# Patient Record
Sex: Female | Born: 1965 | Race: Black or African American | Hispanic: No | Marital: Single | Smoking: Current every day smoker
Health system: Southern US, Community
[De-identification: ages and names within clinical notes are randomized; demographics above are authoritative.]

## PROBLEM LIST (undated history)

## (undated) DIAGNOSIS — E7800 Pure hypercholesterolemia, unspecified: Secondary | ICD-10-CM

## (undated) DIAGNOSIS — I513 Intracardiac thrombosis, not elsewhere classified: Secondary | ICD-10-CM

## (undated) DIAGNOSIS — I5022 Chronic systolic (congestive) heart failure: Secondary | ICD-10-CM

## (undated) DIAGNOSIS — I428 Other cardiomyopathies: Secondary | ICD-10-CM

## (undated) DIAGNOSIS — I10 Essential (primary) hypertension: Secondary | ICD-10-CM

## (undated) HISTORY — DX: Other cardiomyopathies: I42.8

## (undated) HISTORY — PX: TUBAL LIGATION: SHX77

## (undated) HISTORY — DX: Intracardiac thrombosis, not elsewhere classified: I51.3

## (undated) HISTORY — DX: Chronic systolic (congestive) heart failure: I50.22

## (undated) HISTORY — PX: KNEE SURGERY: SHX244

## (undated) HISTORY — PX: ENDOMETRIAL ABLATION: SHX621

---

## 2000-05-31 ENCOUNTER — Emergency Department (HOSPITAL_COMMUNITY): Admission: EM | Admit: 2000-05-31 | Discharge: 2000-05-31 | Payer: Self-pay | Admitting: Emergency Medicine

## 2000-06-08 ENCOUNTER — Emergency Department (HOSPITAL_COMMUNITY): Admission: EM | Admit: 2000-06-08 | Discharge: 2000-06-08 | Payer: Self-pay | Admitting: Emergency Medicine

## 2000-06-08 ENCOUNTER — Encounter: Payer: Self-pay | Admitting: Emergency Medicine

## 2001-04-03 ENCOUNTER — Emergency Department (HOSPITAL_COMMUNITY): Admission: EM | Admit: 2001-04-03 | Discharge: 2001-04-03 | Payer: Self-pay

## 2003-03-23 ENCOUNTER — Emergency Department (HOSPITAL_COMMUNITY): Admission: EM | Admit: 2003-03-23 | Discharge: 2003-03-23 | Payer: Self-pay | Admitting: Emergency Medicine

## 2003-09-07 ENCOUNTER — Other Ambulatory Visit: Admission: RE | Admit: 2003-09-07 | Discharge: 2003-09-07 | Payer: Self-pay | Admitting: Family Medicine

## 2003-09-07 ENCOUNTER — Encounter: Admission: RE | Admit: 2003-09-07 | Discharge: 2003-09-07 | Payer: Self-pay | Admitting: Family Medicine

## 2003-12-14 ENCOUNTER — Emergency Department (HOSPITAL_COMMUNITY): Admission: EM | Admit: 2003-12-14 | Discharge: 2003-12-14 | Payer: Self-pay | Admitting: Emergency Medicine

## 2004-01-09 ENCOUNTER — Encounter: Admission: RE | Admit: 2004-01-09 | Discharge: 2004-01-09 | Payer: Self-pay | Admitting: Family Medicine

## 2004-01-09 ENCOUNTER — Encounter: Admission: RE | Admit: 2004-01-09 | Discharge: 2004-01-09 | Payer: Self-pay | Admitting: Sports Medicine

## 2004-01-25 ENCOUNTER — Encounter: Admission: RE | Admit: 2004-01-25 | Discharge: 2004-01-25 | Payer: Self-pay | Admitting: Family Medicine

## 2004-01-30 ENCOUNTER — Encounter: Admission: RE | Admit: 2004-01-30 | Discharge: 2004-01-30 | Payer: Self-pay | Admitting: Family Medicine

## 2004-05-29 ENCOUNTER — Encounter: Admission: RE | Admit: 2004-05-29 | Discharge: 2004-05-29 | Payer: Self-pay | Admitting: Family Medicine

## 2004-06-21 ENCOUNTER — Ambulatory Visit: Payer: Self-pay | Admitting: Family Medicine

## 2004-08-13 ENCOUNTER — Ambulatory Visit: Payer: Self-pay | Admitting: Family Medicine

## 2004-08-28 ENCOUNTER — Ambulatory Visit: Payer: Self-pay | Admitting: Family Medicine

## 2004-11-09 ENCOUNTER — Emergency Department (HOSPITAL_COMMUNITY): Admission: EM | Admit: 2004-11-09 | Discharge: 2004-11-10 | Payer: Self-pay | Admitting: Emergency Medicine
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
## 2004-12-21 ENCOUNTER — Ambulatory Visit: Payer: Self-pay | Admitting: Family Medicine

## 2005-06-18 ENCOUNTER — Ambulatory Visit: Payer: Self-pay | Admitting: Family Medicine

## 2005-07-11 ENCOUNTER — Ambulatory Visit: Payer: Self-pay | Admitting: Family Medicine

## 2005-07-11 ENCOUNTER — Other Ambulatory Visit: Admission: RE | Admit: 2005-07-11 | Discharge: 2005-07-11 | Payer: Self-pay | Admitting: Family Medicine

## 2005-07-15 ENCOUNTER — Ambulatory Visit (HOSPITAL_COMMUNITY): Admission: RE | Admit: 2005-07-15 | Discharge: 2005-07-15 | Payer: Self-pay | Admitting: Family Medicine

## 2005-07-15 IMAGING — US US PELVIS COMPLETE MODIFY
1 series · 13 of 25 positions shown · non-contrast
Comparison: none

CLINICAL DATA: Menorrhagia.  Possible pelvic mass.  Pelvic pain.  LMP [DATE].
TRANSABDOMINAL AND TRANSVAGINAL PELVIC ULTRASOUND:
Multiple images of the uterus and adnexa were obtained using a transabdominal and endovaginal approaches. 
The uterus has a sagittal length of 7.8 cm, AP width of 5.1 cm and a transverse width of 5.6 cm.  The uterine myometrium is notable for the presence of two areas of focally altered echotexture compatible with focal fibroids.  hese are located in the left lateral lower uterine segment with a partial subserosal component measuring 3.8 x 2.6 x 3.0 cm and in the right fundal region posteriorly also demonstrating a partial subserosal component measuring 1.7 x 1.3 x 1.6 cm.  
The endometrial lining demonstrates an area of focal thickening in the right lateral fundal region just slightly less echoic than the remainder of the endometrial lining measuring 1.1 x 1.1 x 0.8 cm.  With color Doppler evaluation this demonstrates some vascular flow and raises the possibility of a submucosal fibroid or possibly a polyp causing this appearance.  The endometrial lining is otherwise thin and echogenic with an AP width of 3.8 mm.  Follow-up evaluation with sonohysterography would be useful for more complete assessment of this suspected focal endometrial abnormality to be undertaken in the immediate post-menstrual phase of the cycle.  
Both ovaries are seen with the left ovary measuring 3.2 x 2.1 x 2.6 cm.  This contains a small complex area measuring 2.5 x 1.8 x 1.9 cm and this is felt likely to represent a hemorrhagic corpus luteum cyst given the patient?s current phase of the cycle.  The right ovary measures 1.9 x 1.0 x 1.8 cm and has a normal appearance.  No significant pelvic fluid is seen and no separate adnexal masses are noted.

[Series 1: us pelvis complete modify · 0.43mm/px · 13 of 76 slices shown]
[im 1/76]
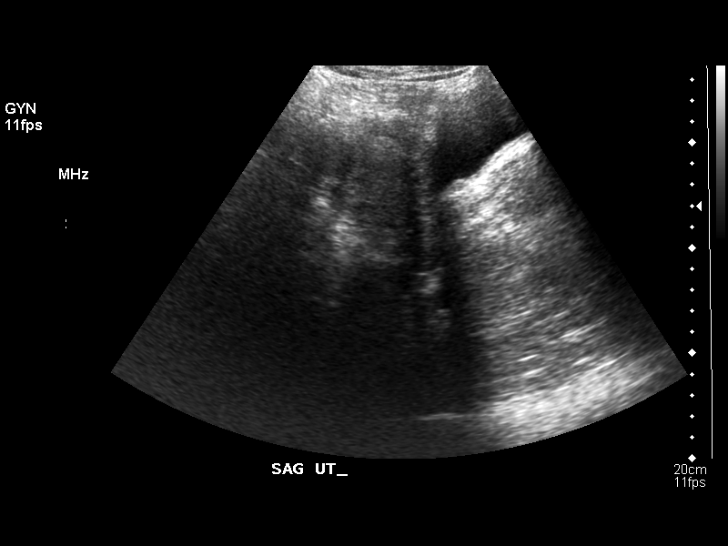
[im 7/76]
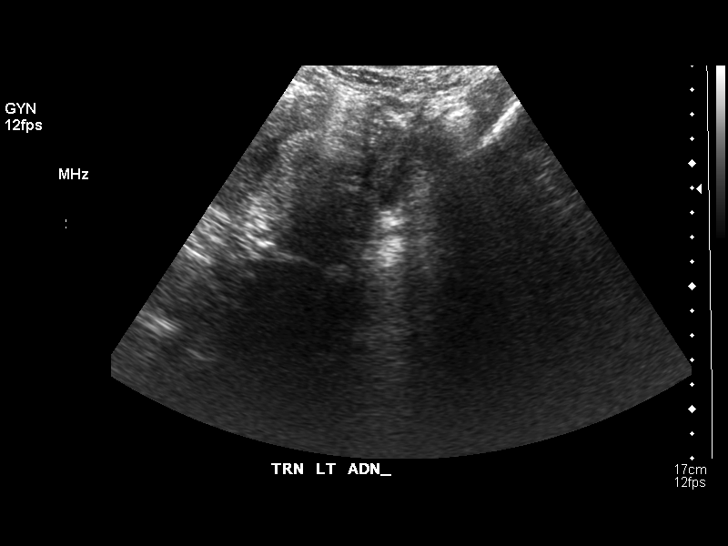
[im 13/76]
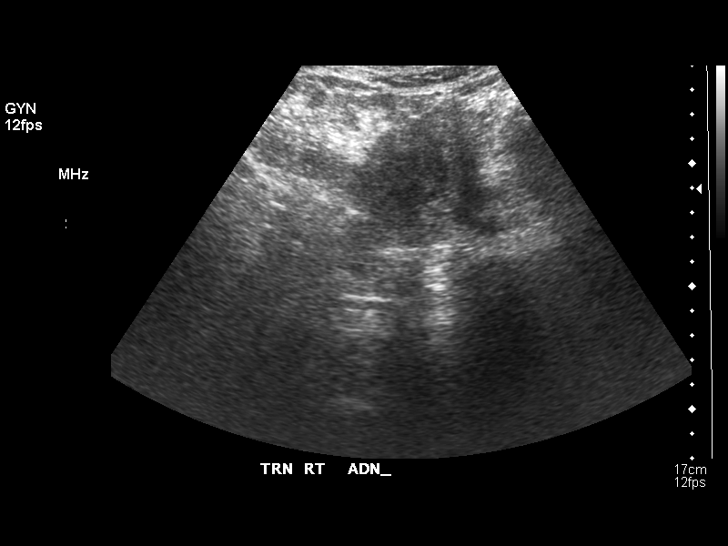
[im 19/76]
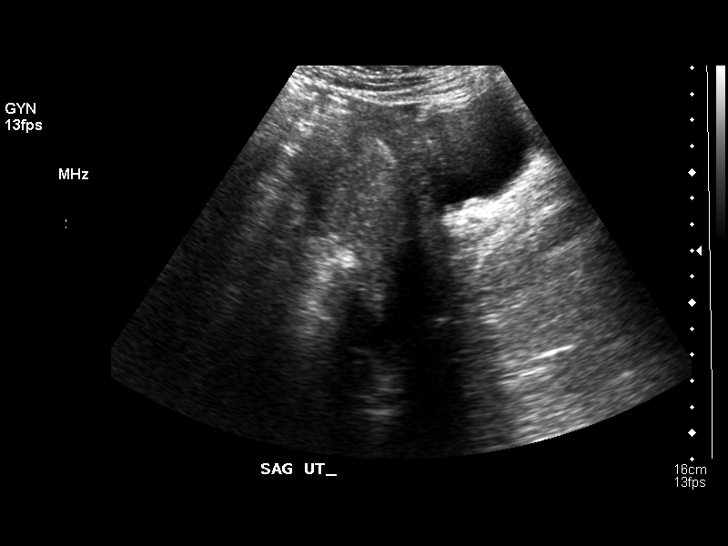
[im 26/76]
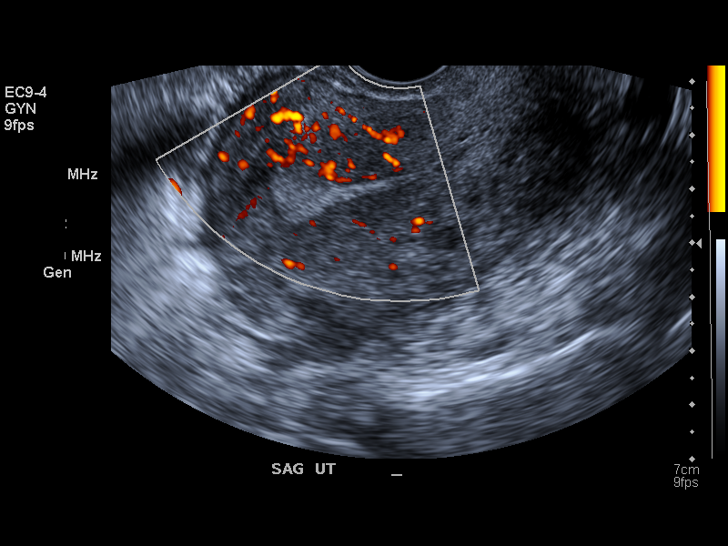
[im 32/76]
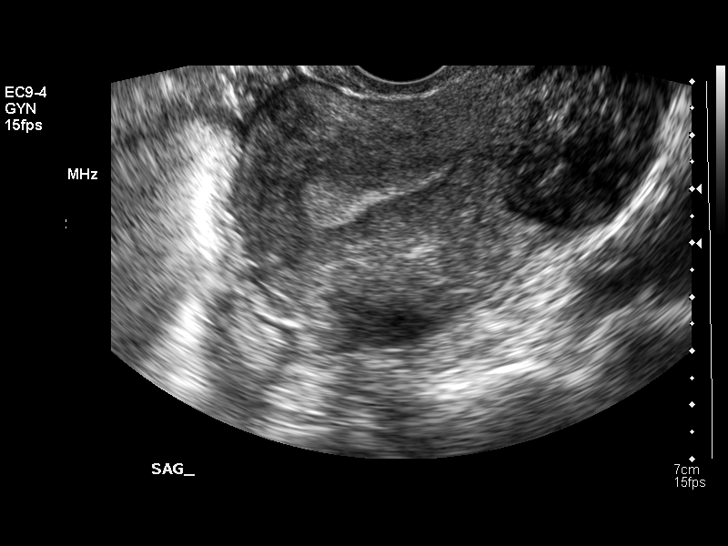
[im 38/76]
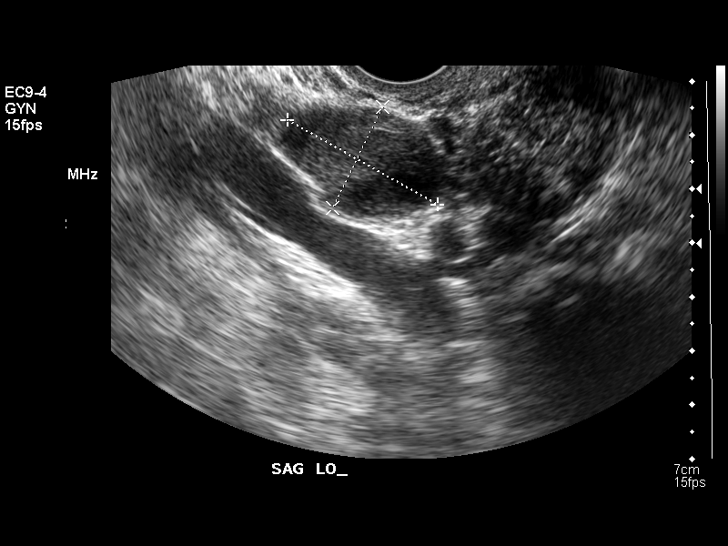
[im 44/76]
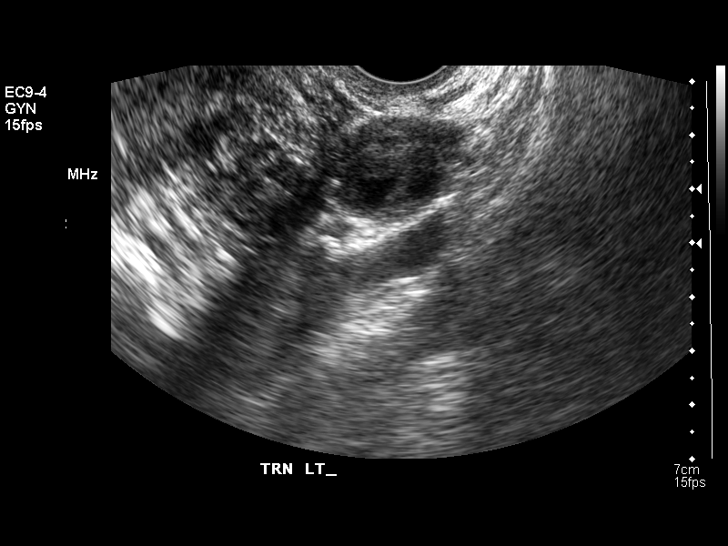
[im 51/76]
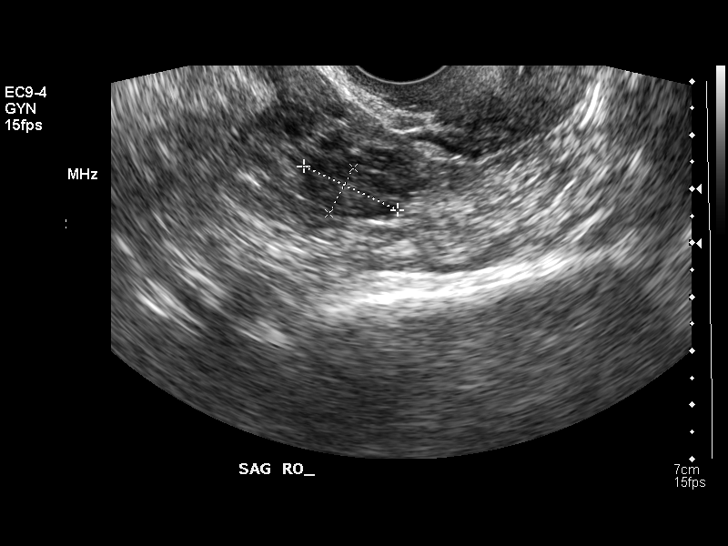
[im 57/76]
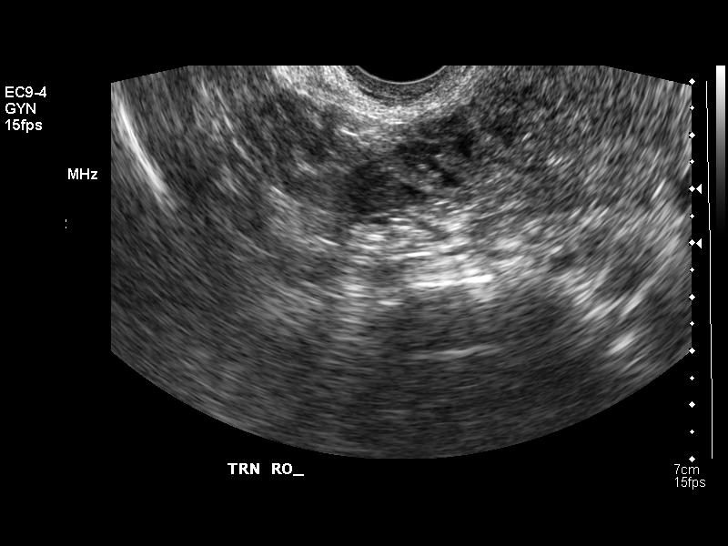
[im 63/76]
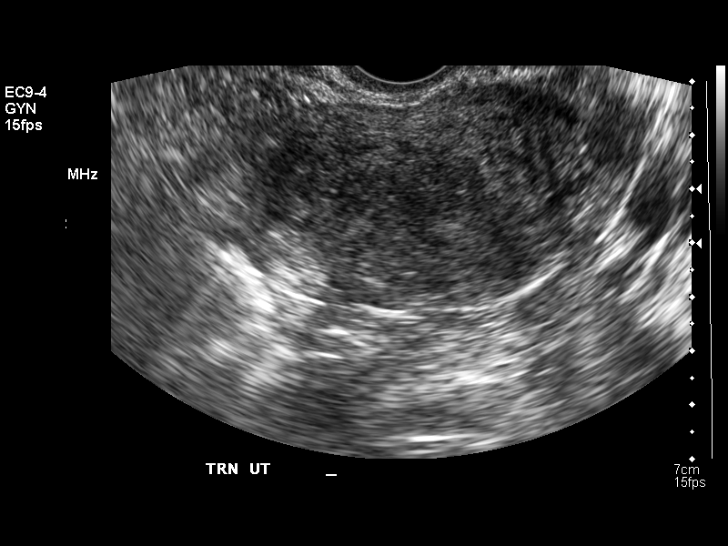
[im 69/76]
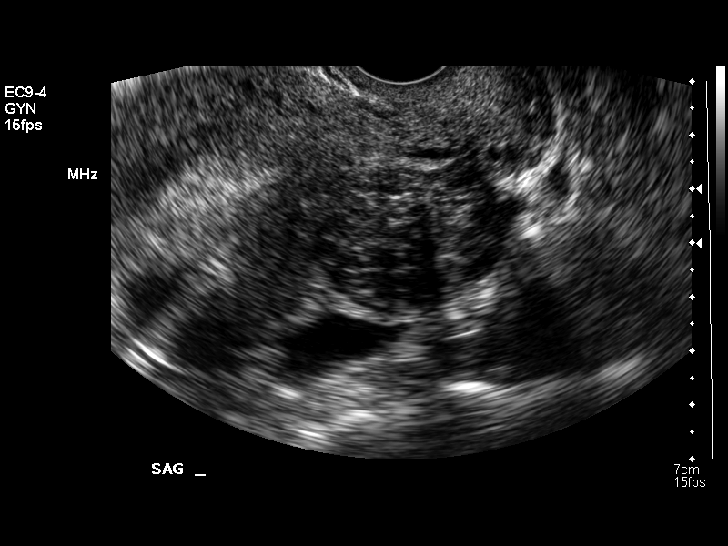
[im 76/76]
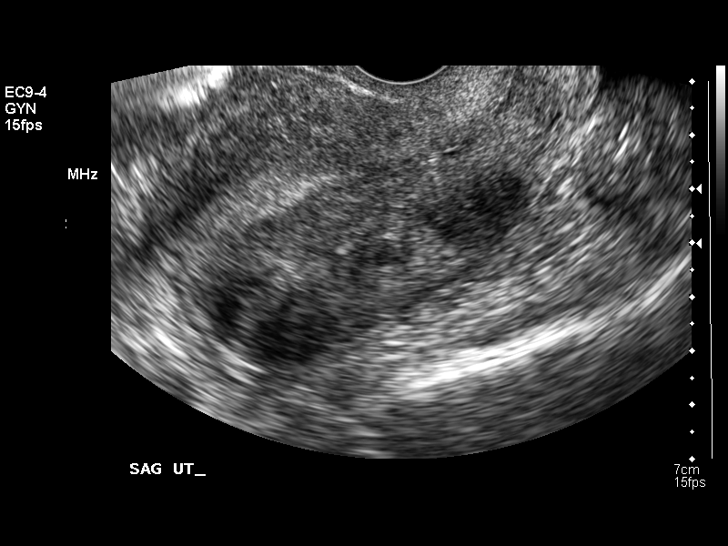

[13 of 25 positions shown; findings below may reference images not displayed]

IMPRESSION: 1.  Fibroid uterus with fibroid sizes and locations as noted above.  
2.  Focal endometrial thickening in the right fundal region suspicious for a small submucosal fibroid and/or polyp.  Follow-up evaluation with sonohysterography in the post-secretory phase of the cycle would be recommended for complete assessment.  
3.  Probable small hemorrhagic corpus luteum cyst in the left ovary.  This can be reassessed at the time of sonohysterography as we would expect to see interval resolution.  Normal right ovary.

## 2005-08-12 ENCOUNTER — Ambulatory Visit: Payer: Self-pay | Admitting: Family Medicine

## 2005-10-08 ENCOUNTER — Ambulatory Visit: Payer: Self-pay | Admitting: Obstetrics & Gynecology

## 2005-10-08 ENCOUNTER — Encounter (INDEPENDENT_AMBULATORY_CARE_PROVIDER_SITE_OTHER): Payer: Self-pay | Admitting: Specialist

## 2005-10-08 ENCOUNTER — Other Ambulatory Visit: Admission: RE | Admit: 2005-10-08 | Discharge: 2005-10-08 | Payer: Self-pay | Admitting: Obstetrics & Gynecology

## 2005-10-22 ENCOUNTER — Ambulatory Visit: Payer: Self-pay | Admitting: Obstetrics & Gynecology

## 2005-11-04 ENCOUNTER — Emergency Department (HOSPITAL_COMMUNITY): Admission: EM | Admit: 2005-11-04 | Discharge: 2005-11-04 | Payer: Self-pay | Admitting: Emergency Medicine

## 2005-12-05 ENCOUNTER — Ambulatory Visit (HOSPITAL_COMMUNITY): Admission: RE | Admit: 2005-12-05 | Discharge: 2005-12-05 | Payer: Self-pay | Admitting: Obstetrics

## 2005-12-05 ENCOUNTER — Encounter (INDEPENDENT_AMBULATORY_CARE_PROVIDER_SITE_OTHER): Payer: Self-pay | Admitting: Specialist

## 2006-01-06 ENCOUNTER — Ambulatory Visit: Payer: Self-pay | Admitting: Family Medicine

## 2006-06-04 ENCOUNTER — Ambulatory Visit: Payer: Self-pay | Admitting: Sports Medicine

## 2006-06-21 ENCOUNTER — Encounter (INDEPENDENT_AMBULATORY_CARE_PROVIDER_SITE_OTHER): Payer: Self-pay | Admitting: *Deleted

## 2006-07-18 ENCOUNTER — Other Ambulatory Visit: Admission: RE | Admit: 2006-07-18 | Discharge: 2006-07-18 | Payer: Self-pay | Admitting: Family Medicine

## 2006-07-18 ENCOUNTER — Ambulatory Visit: Payer: Self-pay | Admitting: Family Medicine

## 2006-08-21 ENCOUNTER — Ambulatory Visit: Payer: Self-pay | Admitting: Sports Medicine

## 2006-11-12 ENCOUNTER — Ambulatory Visit: Payer: Self-pay | Admitting: Family Medicine

## 2006-11-18 ENCOUNTER — Ambulatory Visit: Payer: Self-pay | Admitting: Family Medicine

## 2006-12-18 DIAGNOSIS — I10 Essential (primary) hypertension: Secondary | ICD-10-CM | POA: Insufficient documentation

## 2006-12-18 DIAGNOSIS — E119 Type 2 diabetes mellitus without complications: Secondary | ICD-10-CM

## 2006-12-18 DIAGNOSIS — F329 Major depressive disorder, single episode, unspecified: Secondary | ICD-10-CM | POA: Insufficient documentation

## 2006-12-19 ENCOUNTER — Encounter (INDEPENDENT_AMBULATORY_CARE_PROVIDER_SITE_OTHER): Payer: Self-pay | Admitting: *Deleted

## 2007-05-06 ENCOUNTER — Telehealth: Payer: Self-pay | Admitting: Family Medicine

## 2007-07-16 ENCOUNTER — Encounter: Payer: Self-pay | Admitting: Family Medicine

## 2007-07-28 ENCOUNTER — Encounter: Payer: Self-pay | Admitting: Family Medicine

## 2007-07-31 ENCOUNTER — Encounter (INDEPENDENT_AMBULATORY_CARE_PROVIDER_SITE_OTHER): Payer: Self-pay | Admitting: *Deleted

## 2007-07-31 ENCOUNTER — Telehealth (INDEPENDENT_AMBULATORY_CARE_PROVIDER_SITE_OTHER): Payer: Self-pay | Admitting: *Deleted

## 2007-07-31 ENCOUNTER — Ambulatory Visit: Payer: Self-pay | Admitting: Family Medicine

## 2007-08-06 ENCOUNTER — Ambulatory Visit: Payer: Self-pay | Admitting: Family Medicine

## 2007-08-07 ENCOUNTER — Telehealth: Payer: Self-pay | Admitting: Family Medicine

## 2007-09-04 ENCOUNTER — Emergency Department (HOSPITAL_COMMUNITY): Admission: EM | Admit: 2007-09-04 | Discharge: 2007-09-04 | Payer: Self-pay | Admitting: Emergency Medicine
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
## 2007-09-12 ENCOUNTER — Encounter: Admission: RE | Admit: 2007-09-12 | Discharge: 2007-09-12 | Payer: Self-pay | Admitting: Orthopaedic Surgery
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
## 2007-09-22 ENCOUNTER — Telehealth: Payer: Self-pay | Admitting: Family Medicine

## 2007-09-25 ENCOUNTER — Inpatient Hospital Stay (HOSPITAL_COMMUNITY): Admission: RE | Admit: 2007-09-25 | Discharge: 2007-09-28 | Payer: Self-pay | Admitting: Orthopedic Surgery
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
## 2007-09-29 ENCOUNTER — Emergency Department (HOSPITAL_COMMUNITY): Admission: EM | Admit: 2007-09-29 | Discharge: 2007-09-30 | Payer: Self-pay | Admitting: Emergency Medicine

## 2007-09-30 IMAGING — CT CT ANGIO CHEST
2 of 7 series · 15 of 36 positions shown · IV contrast (APPLIED)
Comparison: None available.

CLINICAL DATA: Chest pain and dyspnea. 
 CT ANGIOGRAPHY OF CHEST:
TECHNIQUE: Multidetector CT imaging of the chest was performed during bolus injection of intravenous contrast.  Multiplanar CT angiographic image reconstructions were generated to evaluate the vascular anatomy.
 Contrast:  80 cc Omnipaque 350.

[Series 4: pulm embolism 2.0 st · axial · 0.58mm/px · z∈[-270,-72]mm · 14 of 115 slices shown]
[im 8/115  lung]
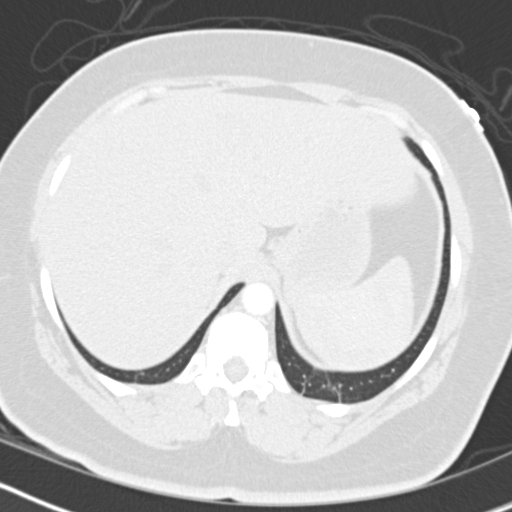
[im 16/115  mediastinal]
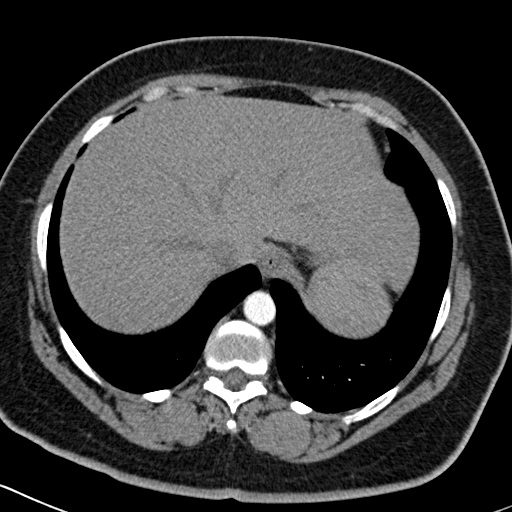
[im 23/115  lung]
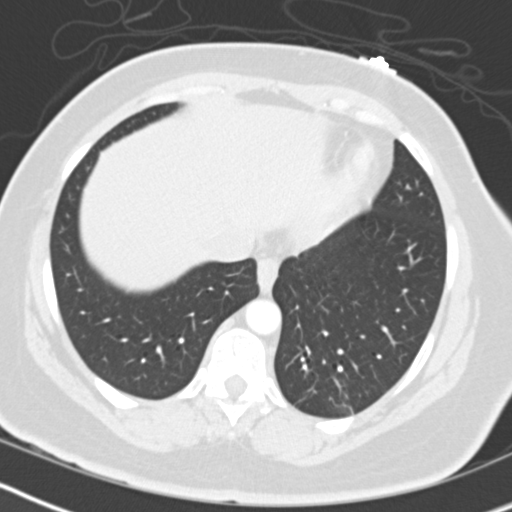
[im 31/115  mediastinal]
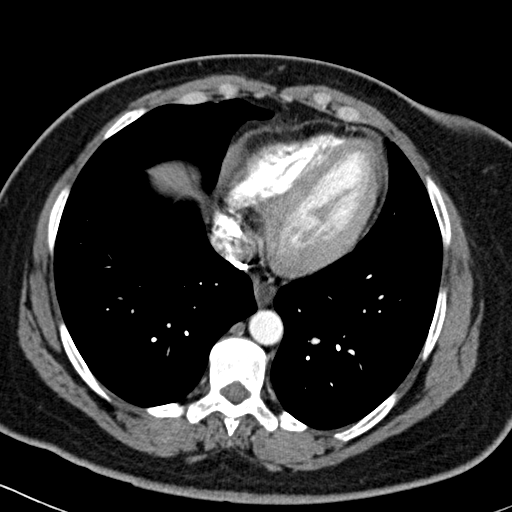
[im 39/115  lung]
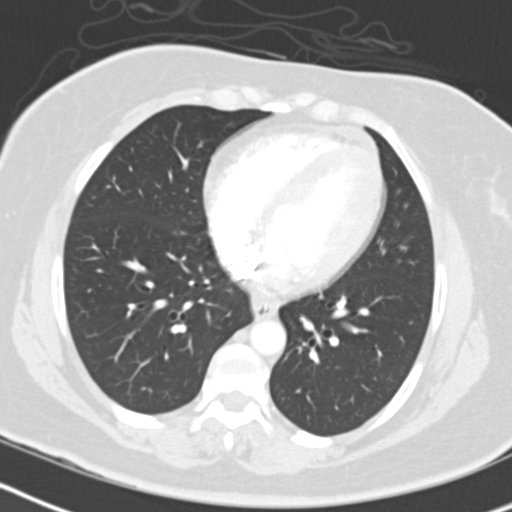
[im 46/115  mediastinal]
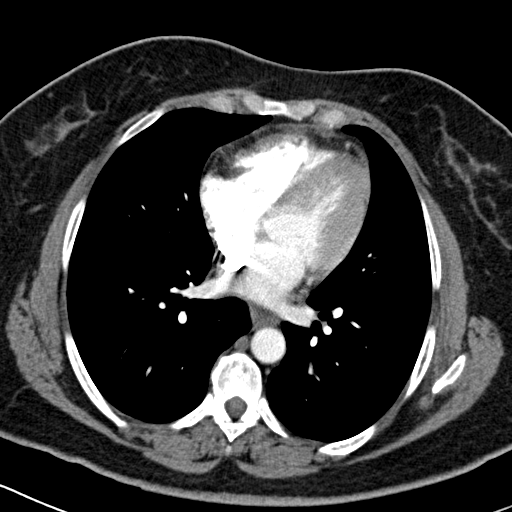
[im 54/115  lung]
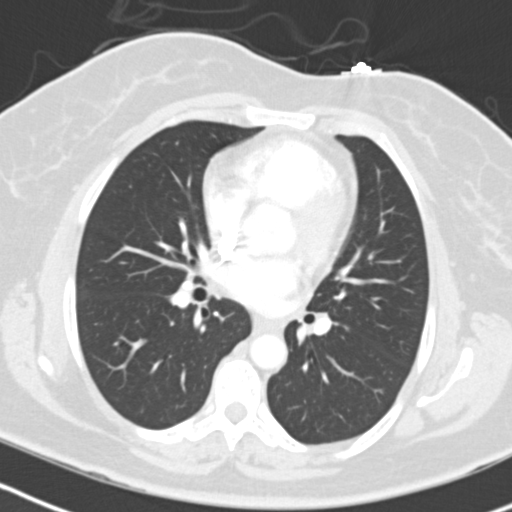
[im 61/115  mediastinal]
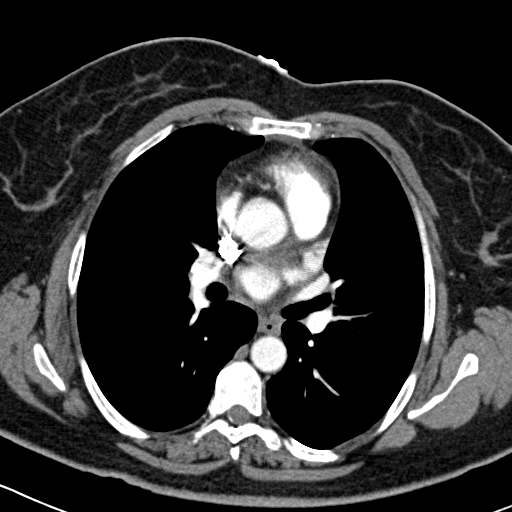
[im 69/115  lung]
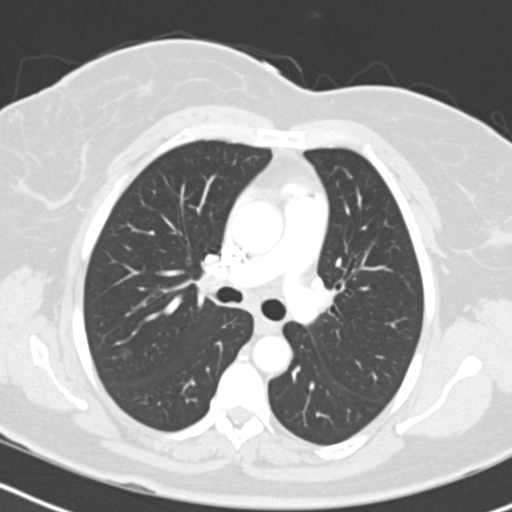
[im 77/115  mediastinal]
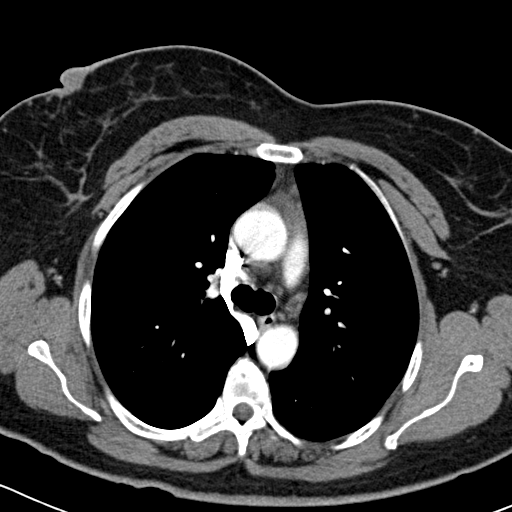
[im 84/115  lung]
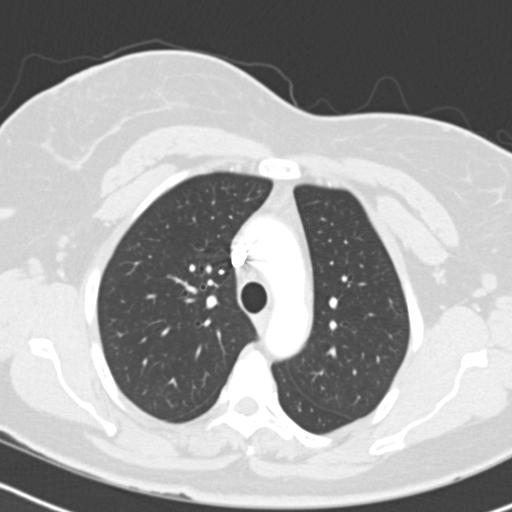
[im 92/115  mediastinal]
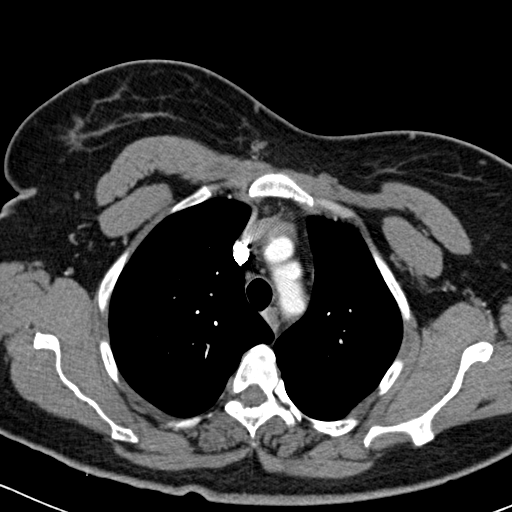
[im 99/115  lung]
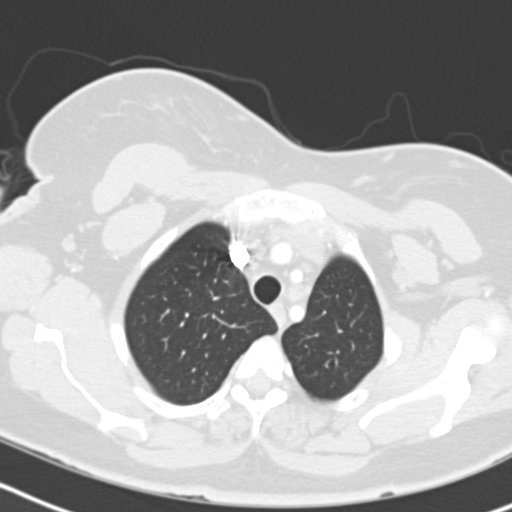
[im 107/115  mediastinal]
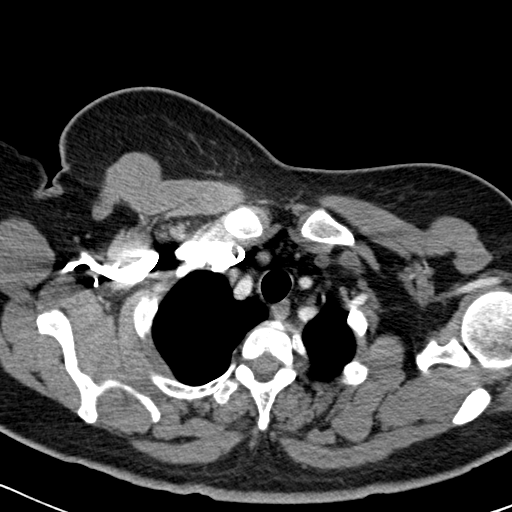

[Series 602: cor · coronal · 0.58mm/px · 1 of 117 slices shown]
[im 59/117  mediastinal]
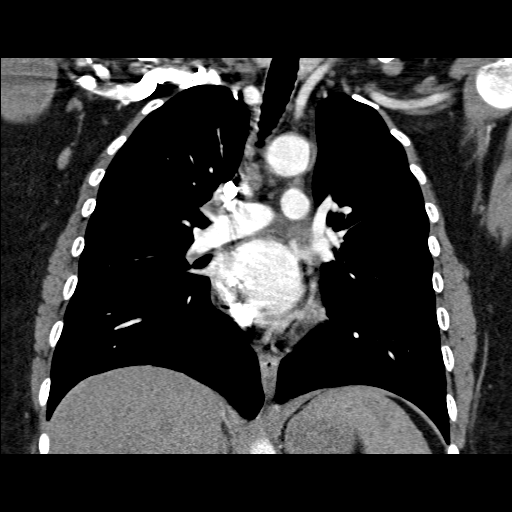

[15 of 36 positions shown; findings below may reference images not displayed]

FINDINGS: This study is technically satisfactory.  There are no filling defects identified in the pulmonary arterial system to suggest pulmonary emboli.  There is no evidence of thoracic aorta aneurysm or dissection.  Tiny pericardial effusion is noted.  No pleural effusions are identified.  Upper limits of normal hilar and mediastinal lymph node with a node noted with a 1 cm node in the upper left mediastinum, between the left subclavian and common carotid arteries (image 13).  The lungs are clear, except for minimal left basilar atelectasis.  There is a suggestion of a 1.5 x 2 cm lesion within the central thyroid.  Elective ultrasound is recommended for further evaluation.
IMPRESSION: 1. No evidence of pulmonary emboli or thoracic aortic dissection/aneurysm.
 2. Question ? 1.5 x 2 cm thyroid lesion ? recommend elective ultrasound evaluation.
 3. Upper limits of normal hilar and mediastinal lymph nodes.  Probably reactive. Consider followup as indicated.

## 2007-09-30 IMAGING — CR DG ABDOMEN ACUTE W/ 1V CHEST
3 series · 3 of 3 positions shown · non-contrast
Comparison: Chest of [DATE].

CLINICAL DATA: Pain.  
 ACUTE ABDOMINAL SERIES WITH CHEST ? 3 VIEW:

[w chest pa]
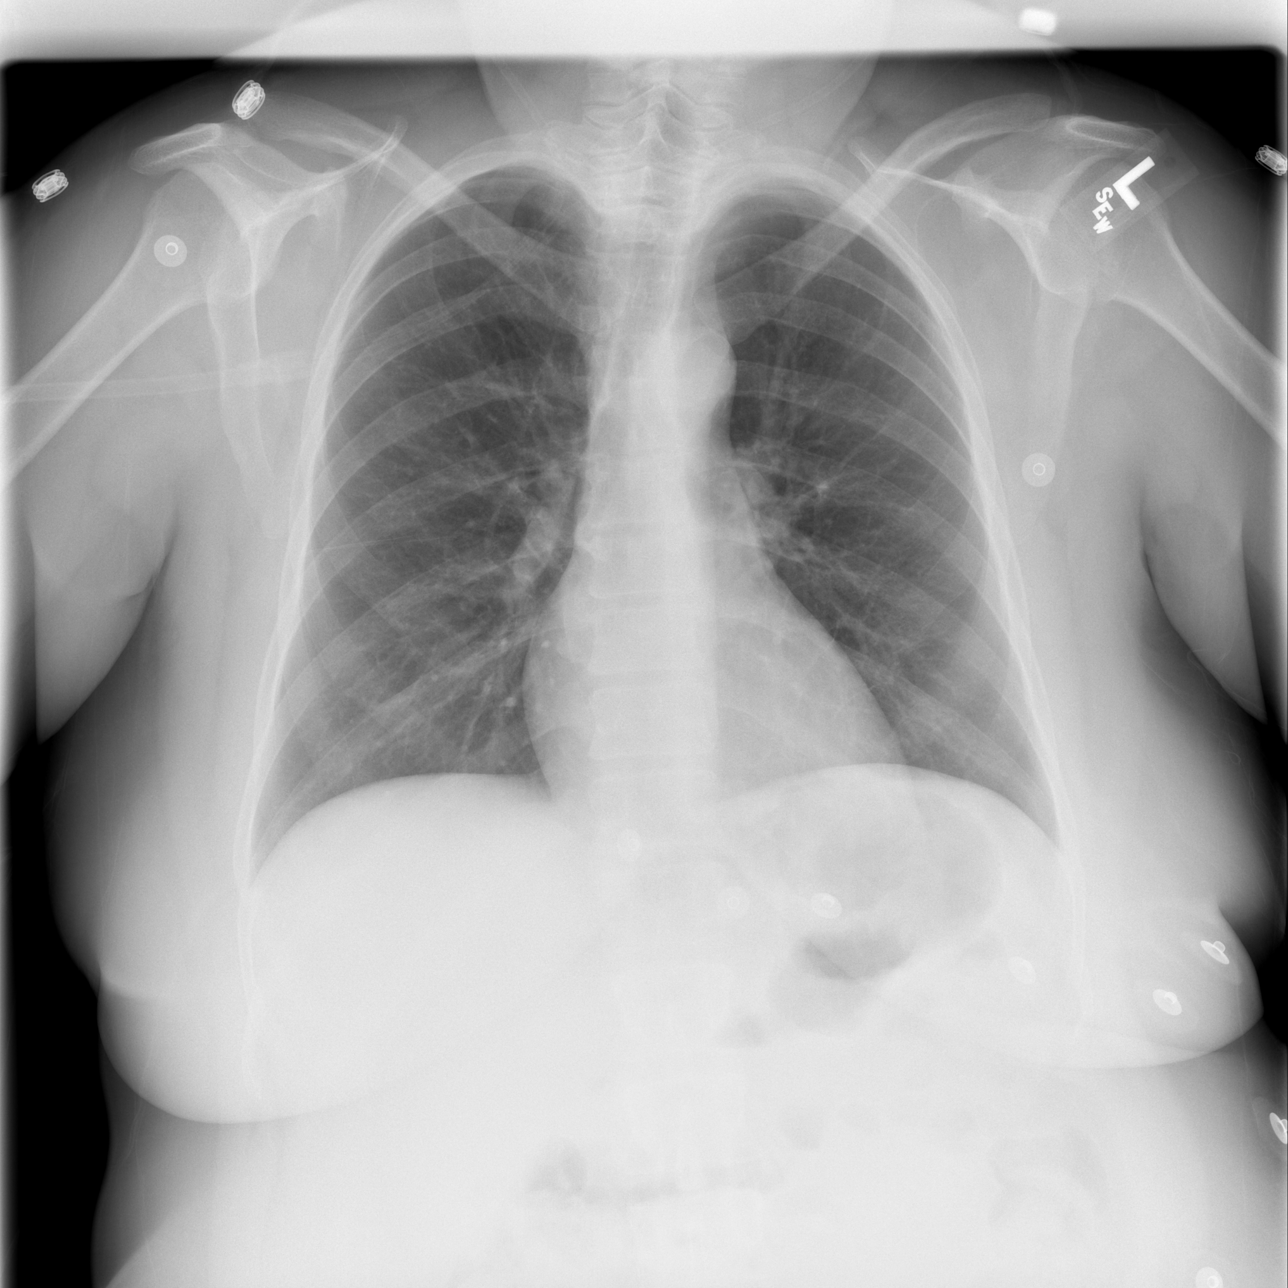

[w abdomen upright]
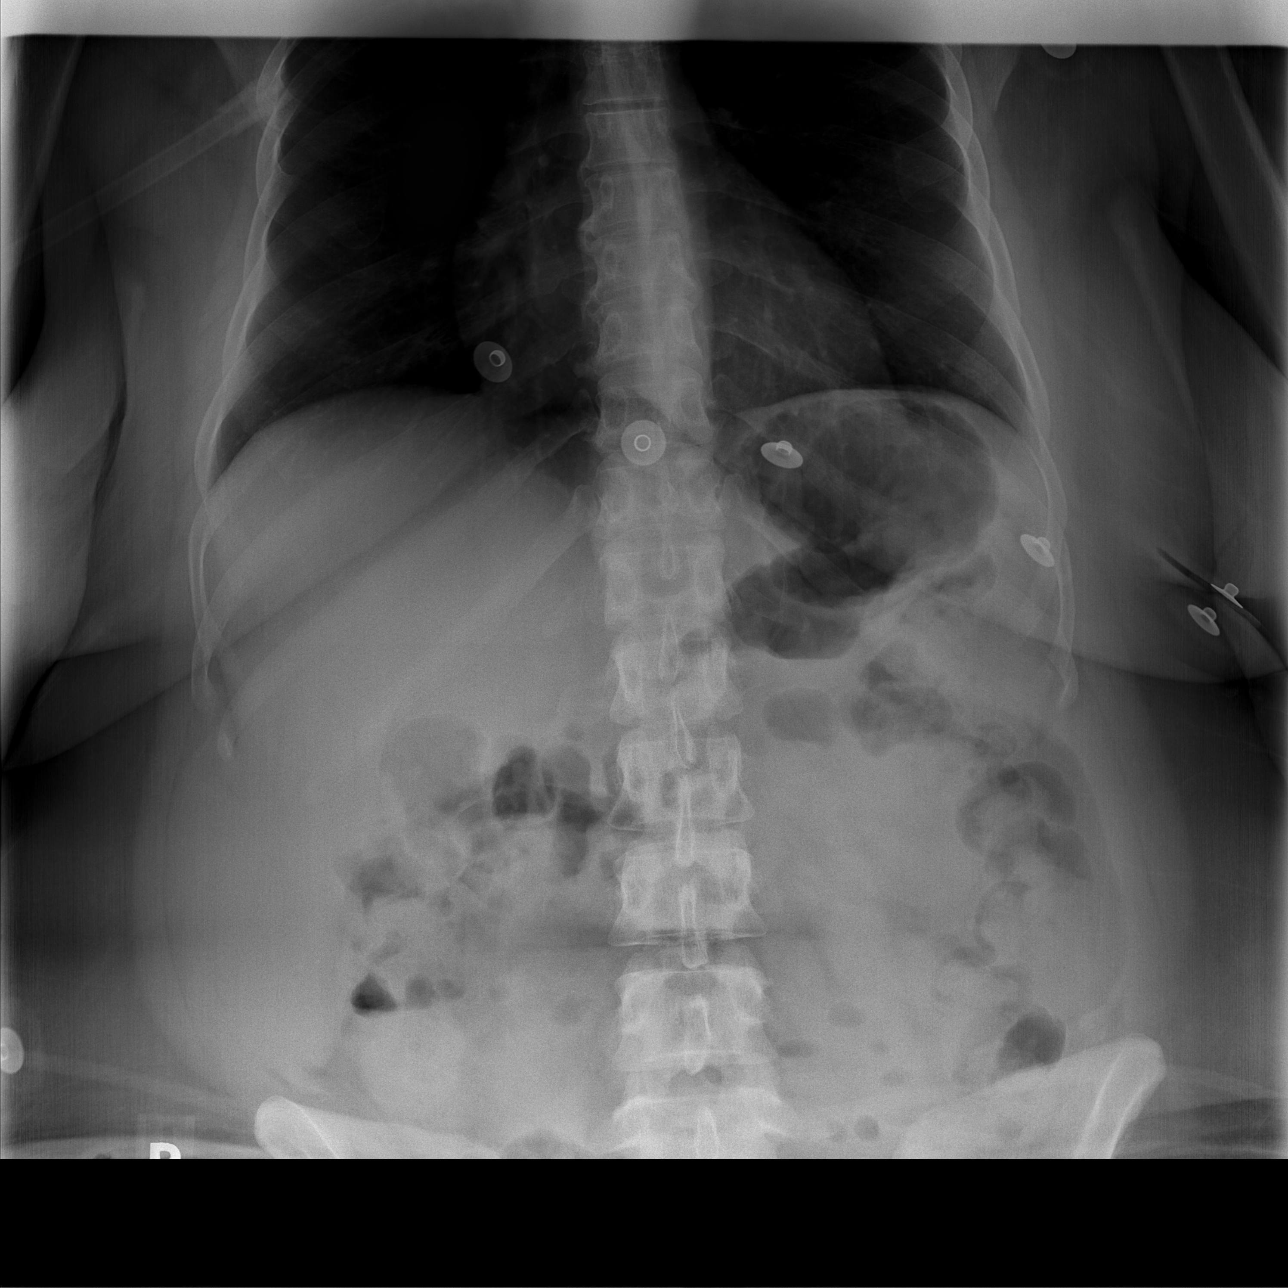

[t abdomen supine]
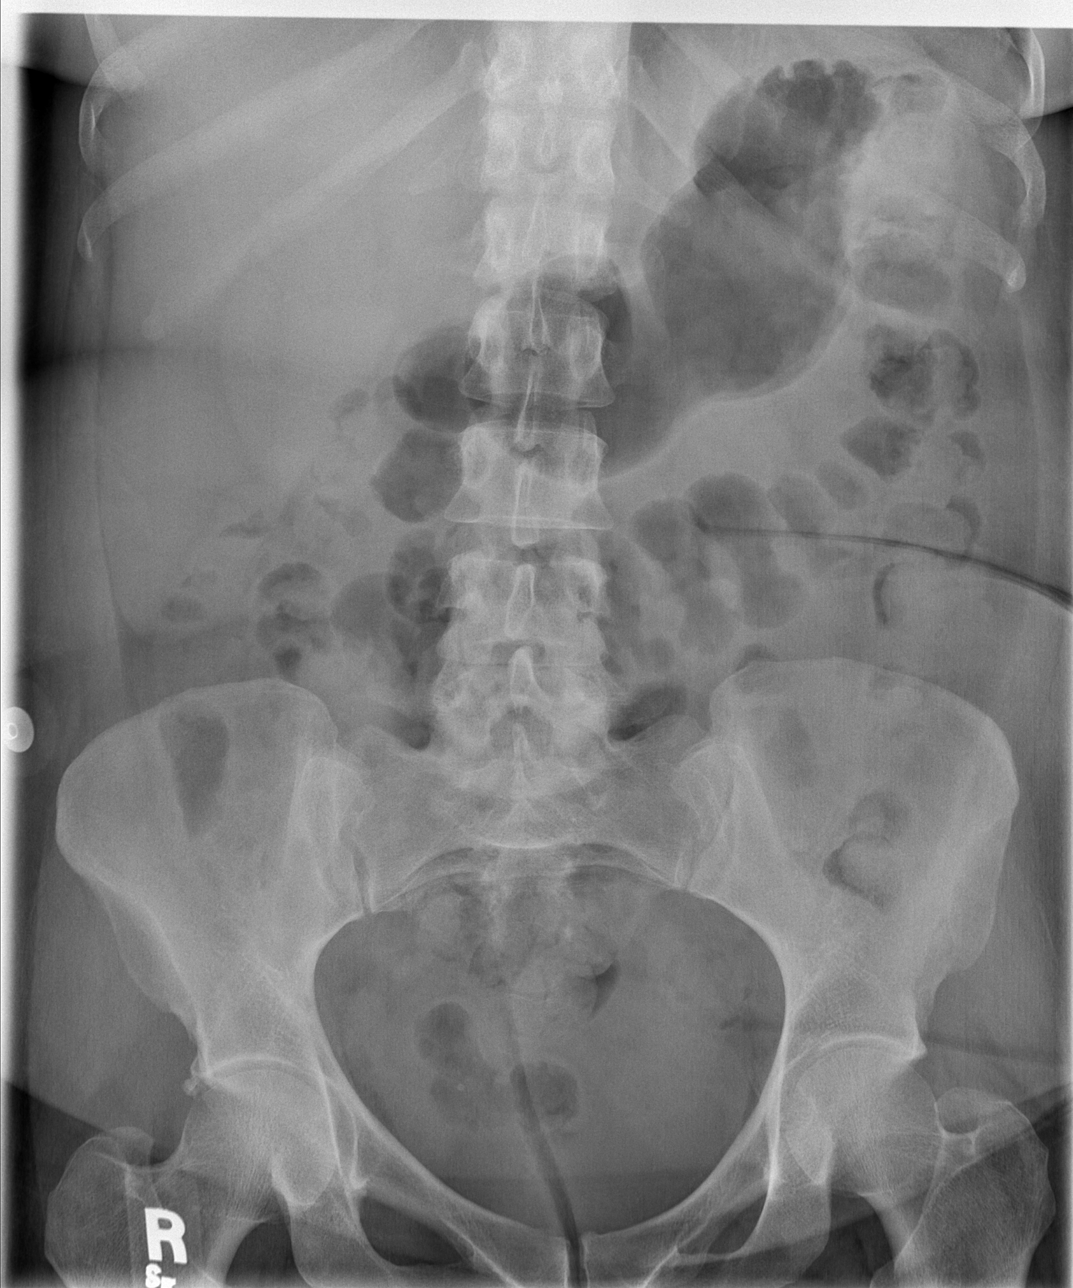

[3 of 3 positions shown; findings below may reference images not displayed]

FINDINGS: Single view of the chest demonstrates clear lungs and no pleural effusion.  Heart size is normal.  
 Two views of the abdomen show normal bowel gas pattern.  There is no free intraperitoneal air.  No unexpected abdominal calcifications.
IMPRESSION: Negative exam.
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
## 2008-01-25 ENCOUNTER — Encounter: Payer: Self-pay | Admitting: Family Medicine

## 2008-01-25 ENCOUNTER — Ambulatory Visit: Payer: Self-pay | Admitting: Family Medicine

## 2008-01-25 LAB — CONVERTED CEMR LAB
Alkaline Phosphatase: 84 units/L (ref 39–117)
Chloride: 101 meq/L (ref 96–112)
Cholesterol: 222 mg/dL — ABNORMAL HIGH (ref 0–200)
Glucose, Bld: 141 mg/dL — ABNORMAL HIGH (ref 70–99)
HDL: 45 mg/dL (ref >39)
LDL Cholesterol: 146 mg/dL — ABNORMAL HIGH (ref 0–99)
Pap Smear: NORMAL
Potassium: 3.9 meq/L (ref 3.5–5.3)
Triglycerides: 157 mg/dL — ABNORMAL HIGH (ref <150)
VLDL: 31 mg/dL (ref 0–40)

## 2008-02-02 ENCOUNTER — Encounter: Payer: Self-pay | Admitting: Family Medicine

## 2008-10-03 ENCOUNTER — Encounter: Payer: Self-pay | Admitting: Family Medicine

## 2008-10-03 ENCOUNTER — Ambulatory Visit: Payer: Self-pay | Admitting: Family Medicine

## 2008-10-03 LAB — CONVERTED CEMR LAB
ALT: <8 units/L (ref 0–35)
CO2: 21 meq/L (ref 19–32)
Calcium: 9.6 mg/dL (ref 8.4–10.5)
Chloride: 100 meq/L (ref 96–112)
Creatinine, Ser: 0.71 mg/dL (ref 0.40–1.20)
Direct LDL: 115 mg/dL — ABNORMAL HIGH
Total Bilirubin: 0.4 mg/dL (ref 0.3–1.2)

## 2008-10-04 ENCOUNTER — Encounter: Payer: Self-pay | Admitting: Family Medicine

## 2008-10-05 ENCOUNTER — Telehealth: Payer: Self-pay | Admitting: Family Medicine

## 2008-10-28 ENCOUNTER — Telehealth (INDEPENDENT_AMBULATORY_CARE_PROVIDER_SITE_OTHER): Payer: Self-pay | Admitting: *Deleted

## 2008-11-02 ENCOUNTER — Emergency Department (HOSPITAL_COMMUNITY): Admission: EM | Admit: 2008-11-02 | Discharge: 2008-11-02 | Payer: Self-pay | Admitting: Emergency Medicine

## 2009-03-28 ENCOUNTER — Ambulatory Visit: Payer: Self-pay | Admitting: Family Medicine

## 2009-03-28 ENCOUNTER — Telehealth: Payer: Self-pay | Admitting: Family Medicine

## 2009-03-30 ENCOUNTER — Telehealth: Payer: Self-pay | Admitting: Family Medicine

## 2009-04-27 ENCOUNTER — Encounter: Admission: RE | Admit: 2009-04-27 | Discharge: 2009-04-27 | Payer: Self-pay | Admitting: Family Medicine

## 2009-04-27 IMAGING — CT CT HEAD W/O CM
2 series · 16 of 30 positions shown, 20 images · non-contrast
Comparison: [DATE]

CLINICAL DATA: Persistent headache for month.  Facial pain.

CT HEAD WITHOUT CONTRAST
TECHNIQUE: Contiguous axial images were obtained from the base of
the skull through the vertex without contrast

[Series 2: brain · axial · 0.45mm/px · z∈[-383,-259]mm · 13 of 30 slices shown, 17 images]
[im 3/30  brain]
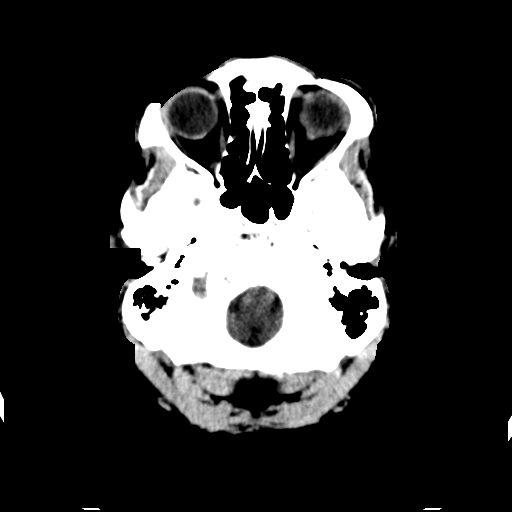
[im 3/30  bone]
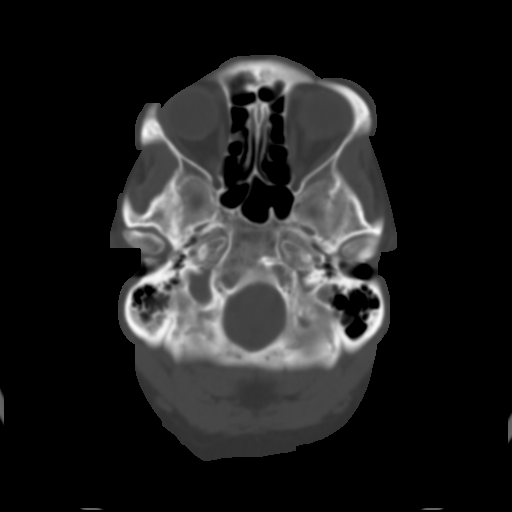
[im 5/30  brain]
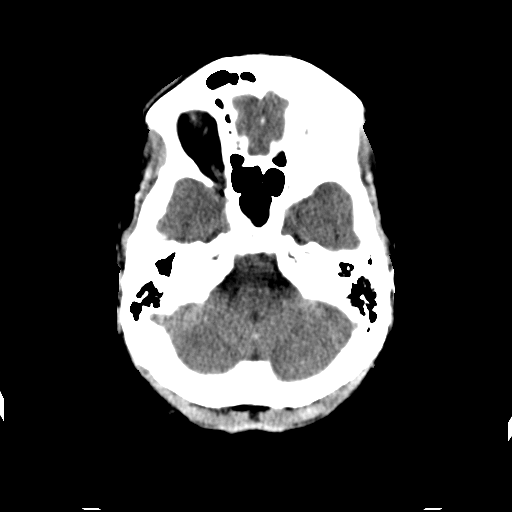
[im 7/30  brain]
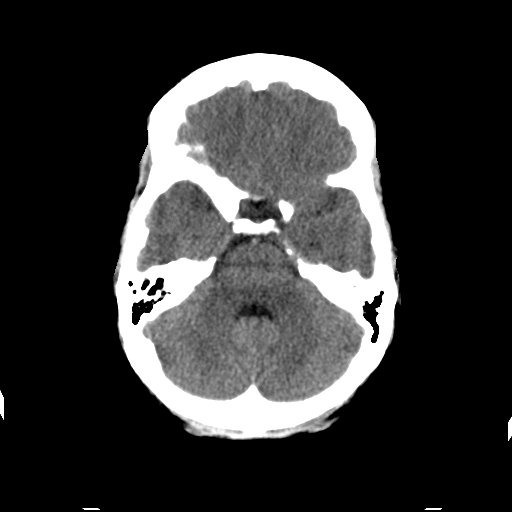
[im 9/30  brain]
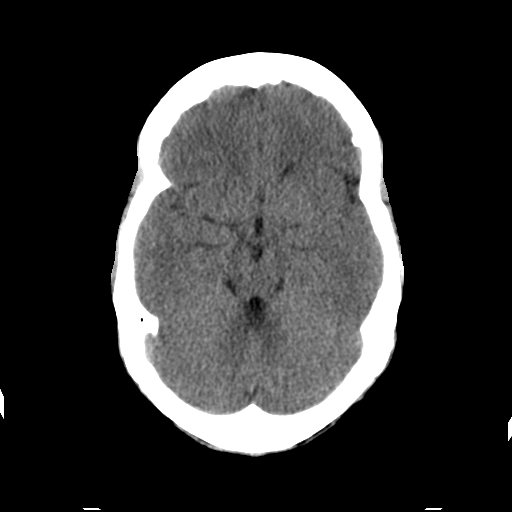
[im 11/30  brain]
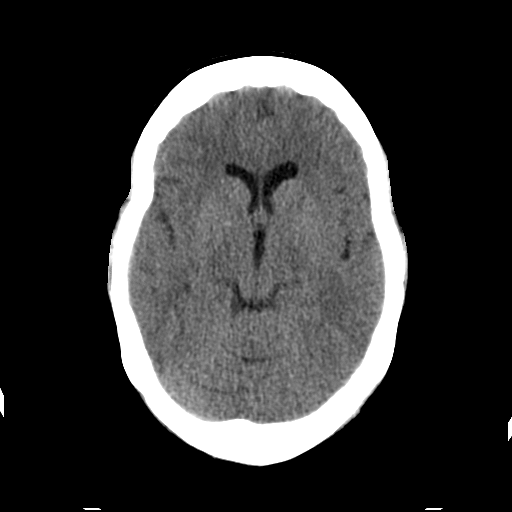
[im 11/30  bone]
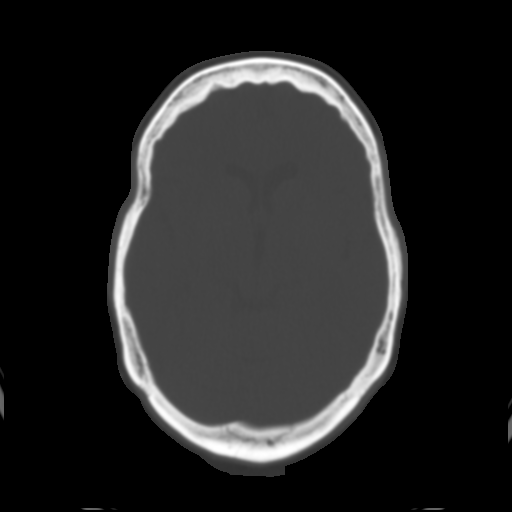
[im 13/30  brain]
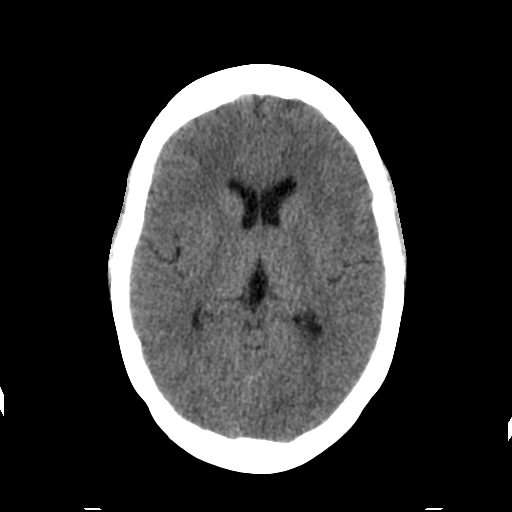
[im 15/30  brain]
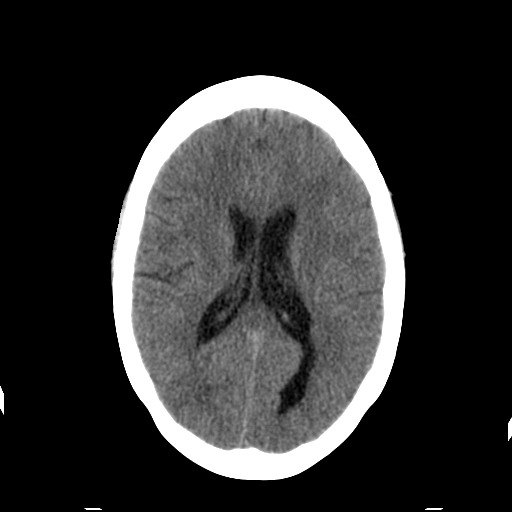
[im 17/30  brain]
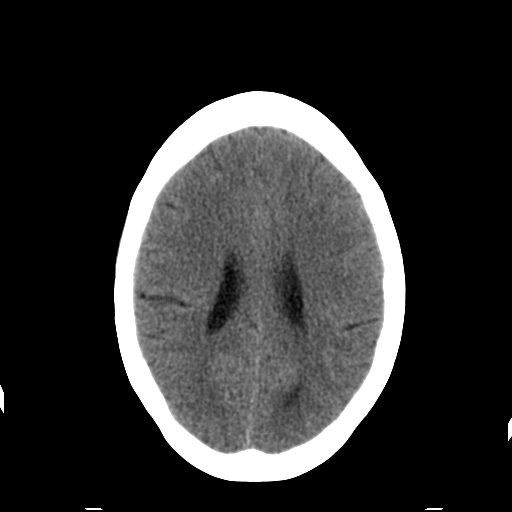
[im 19/30  brain]
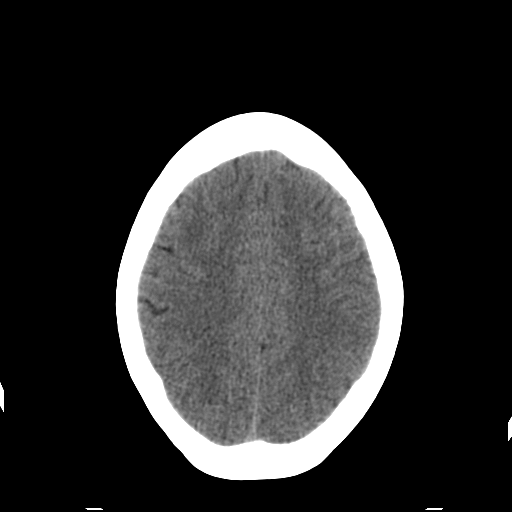
[im 19/30  bone]
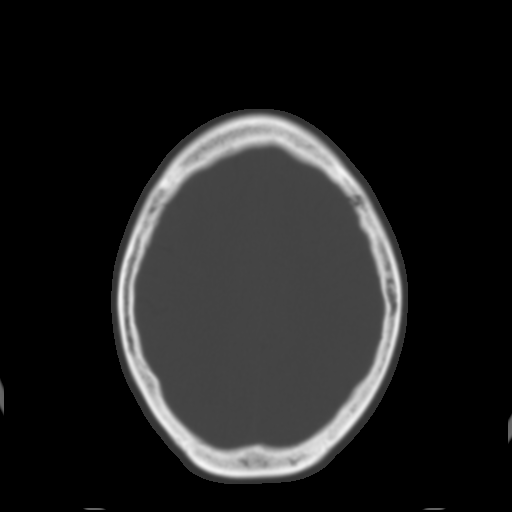
[im 21/30  brain]
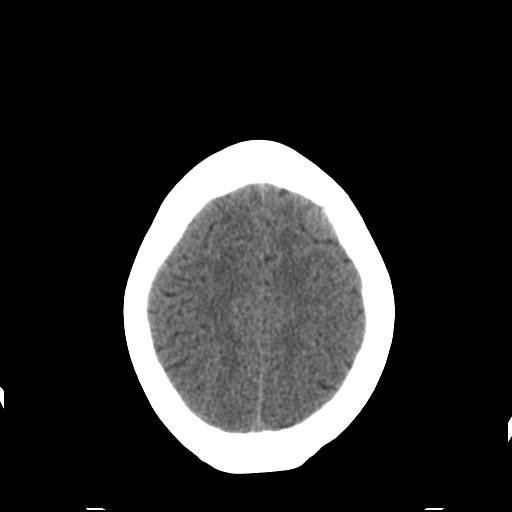
[im 23/30  brain]
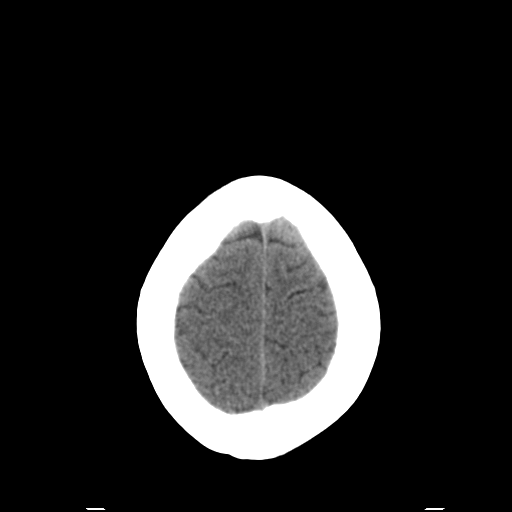
[im 25/30  brain]
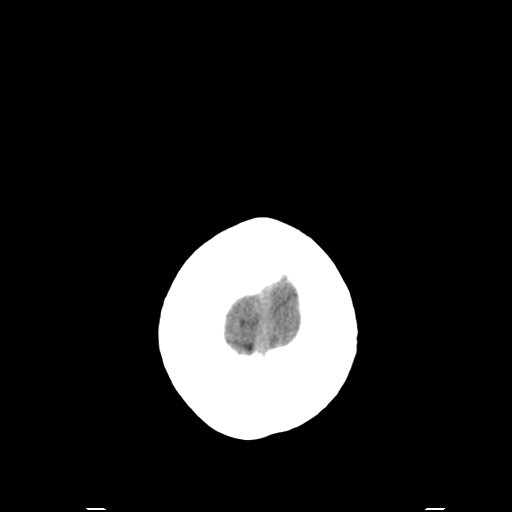
[im 27/30  brain]
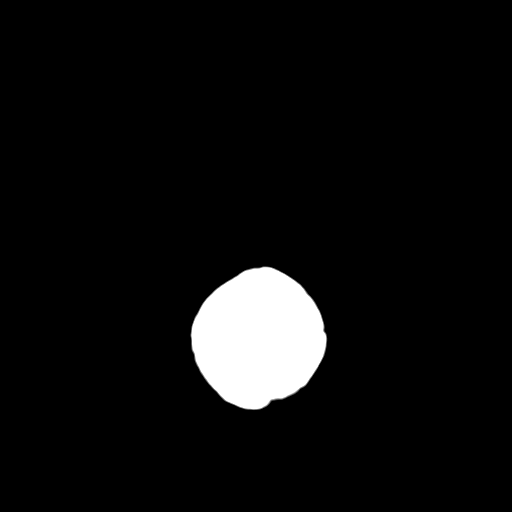
[im 27/30  bone]
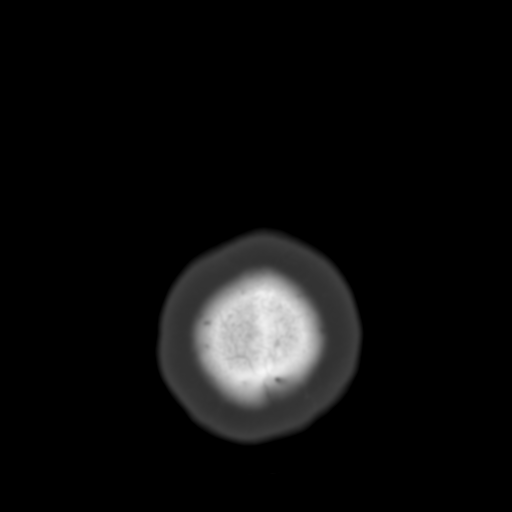

[Series 3: bone windows · axial · 0.45mm/px · z∈[-383,-342]mm · 3 of 30 slices shown]
[im 3/30  bone]
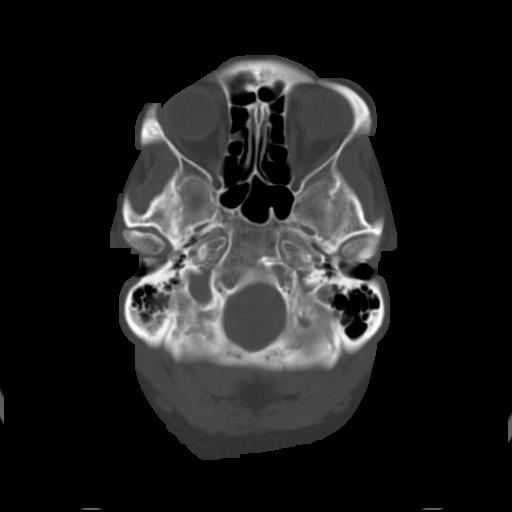
[im 7/30  bone]
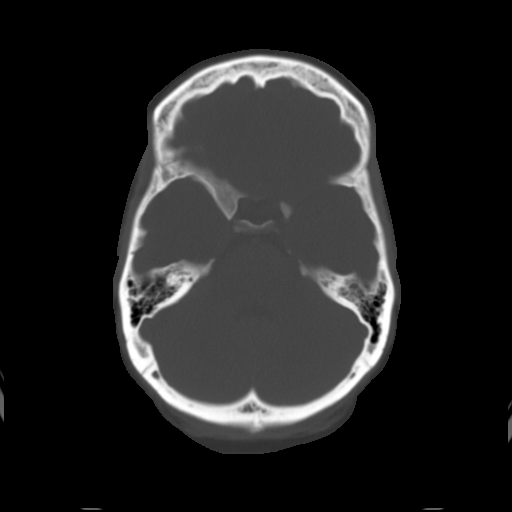
[im 11/30  bone]
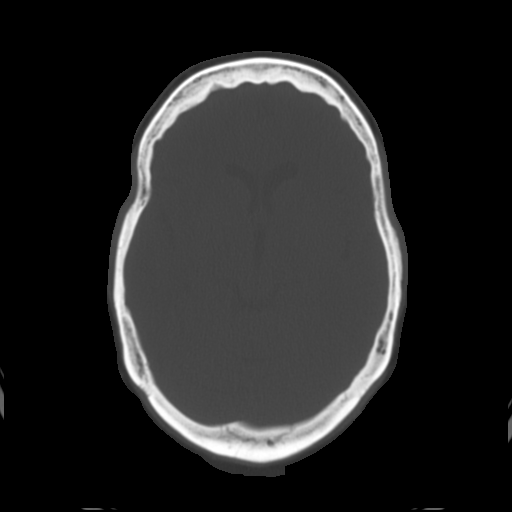

[16 of 30 positions shown; findings below may reference images not displayed]

FINDINGS: The brain has a normal appearance without evidence for
hemorrhage, acute infarction, hydrocephalus, or mass lesion.  There
is no extra axial fluid collection.  The skull and paranasal
sinuses are normal.
IMPRESSION: Normal CT of the head without contrast.

## 2009-04-27 IMAGING — CT CT PARANASAL SINUSES LIMITED
1 series · 14 of 16 positions shown, 18 images · non-contrast
Comparison: None

CLINICAL DATA: Persistent headache

CT PARANASAL SINUS LIMITED WITHOUT CONTRAST
TECHNIQUE: Multidetector CT images of the paranasal sinuses were
obtained in a single plane without contrast.

[Series 2: sinus · axial · 0.49mm/px · z∈[-308,-212]mm · 14 of 16 slices shown, 18 images]
[im 2/16  brain]
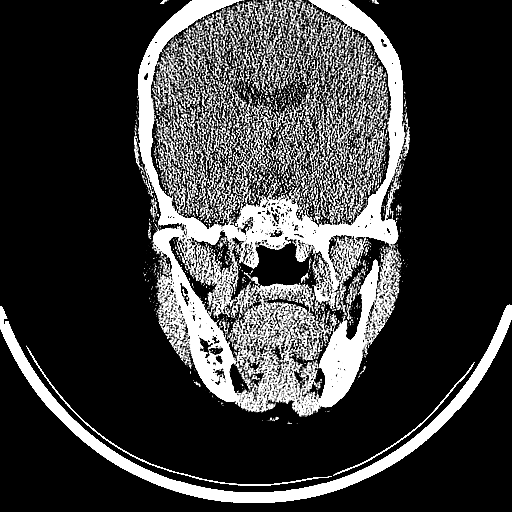
[im 2/16  bone]
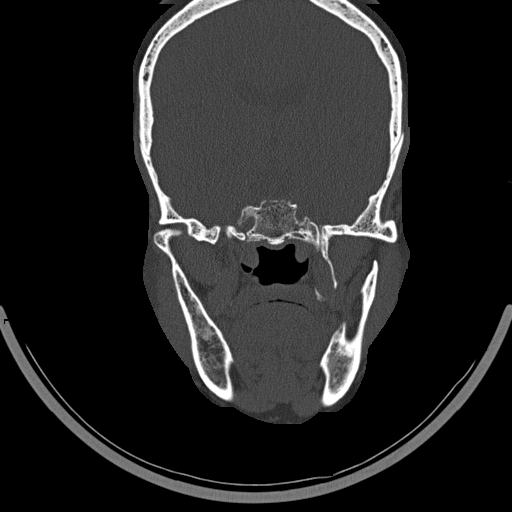
[im 3/16  bone]
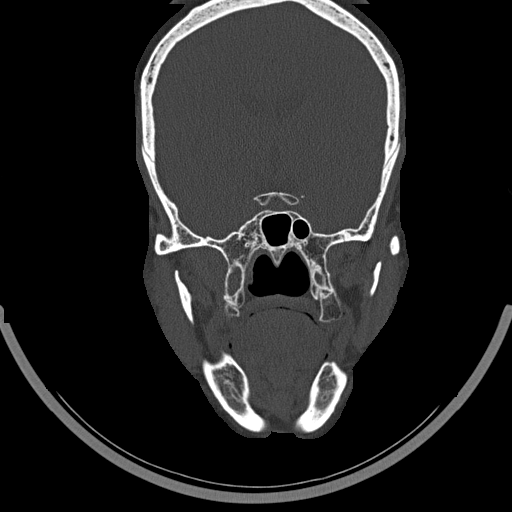
[im 4/16  bone]
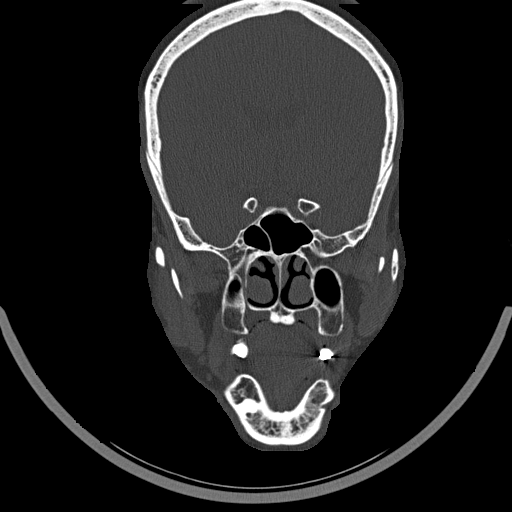
[im 5/16  bone]
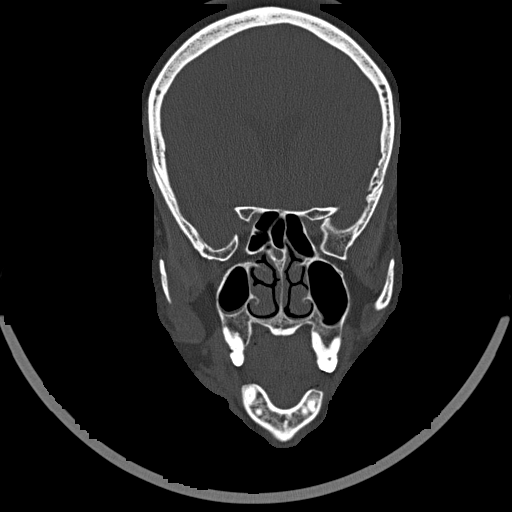
[im 6/16  brain]
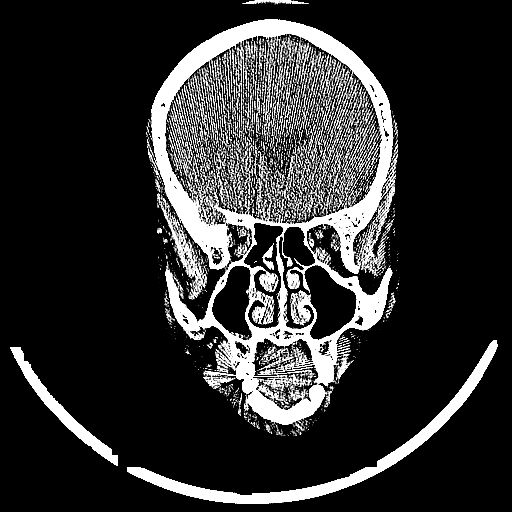
[im 6/16  bone]
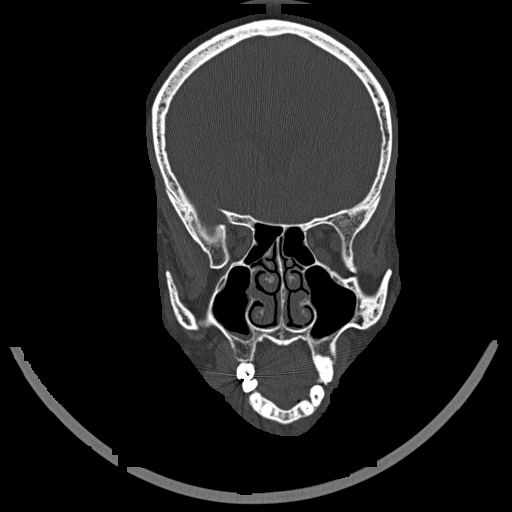
[im 7/16  bone]
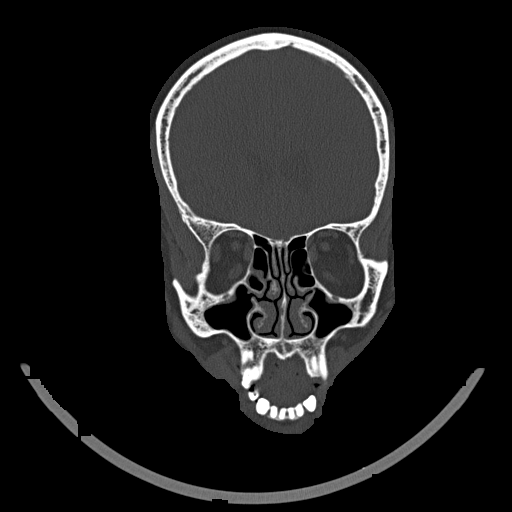
[im 8/16  bone]
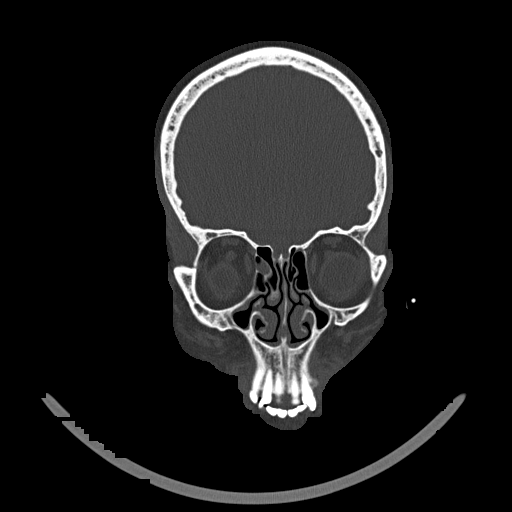
[im 9/16  bone]
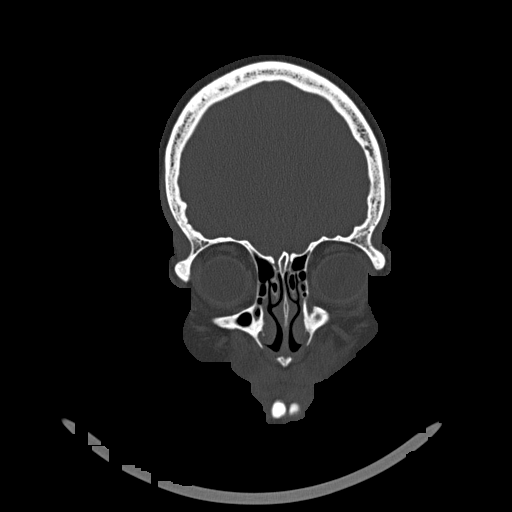
[im 10/16  brain]
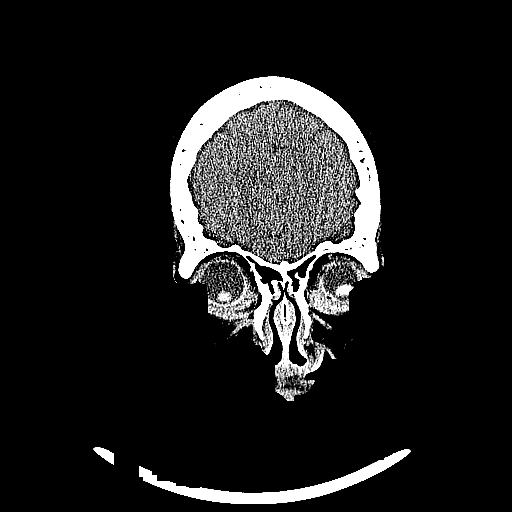
[im 10/16  bone]
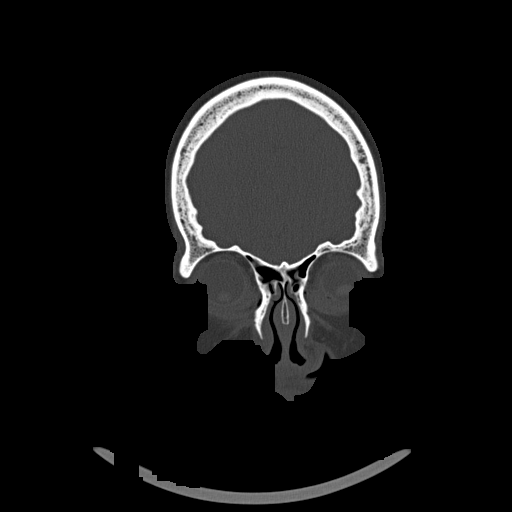
[im 11/16  bone]
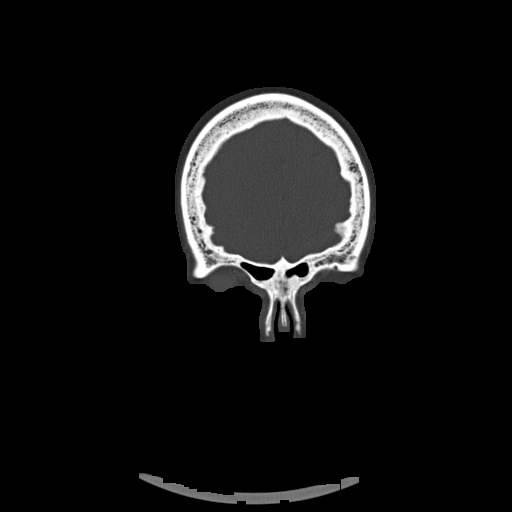
[im 12/16  bone]
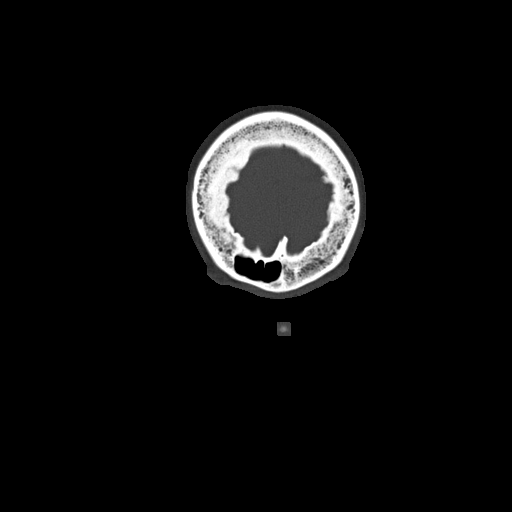
[im 13/16  bone]
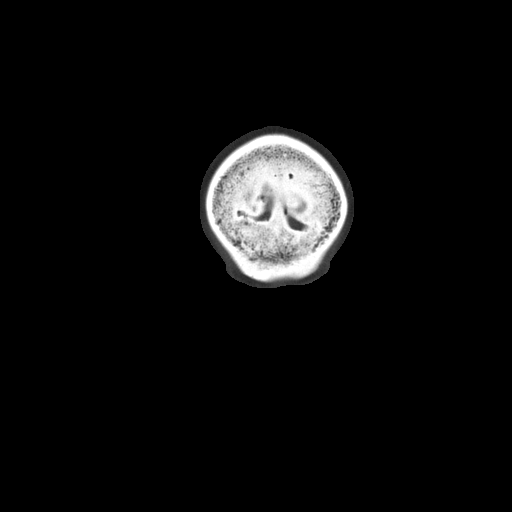
[im 14/16  brain]
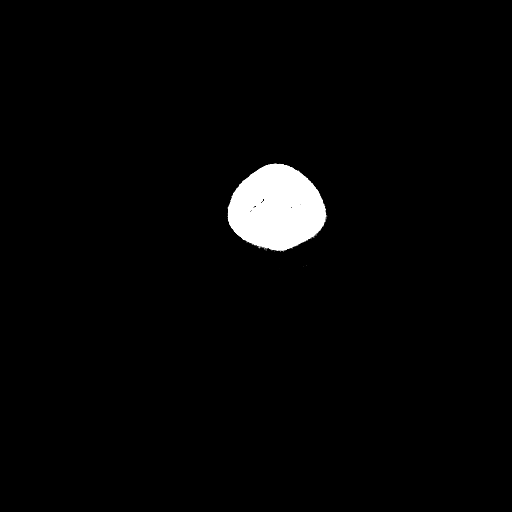
[im 14/16  bone]
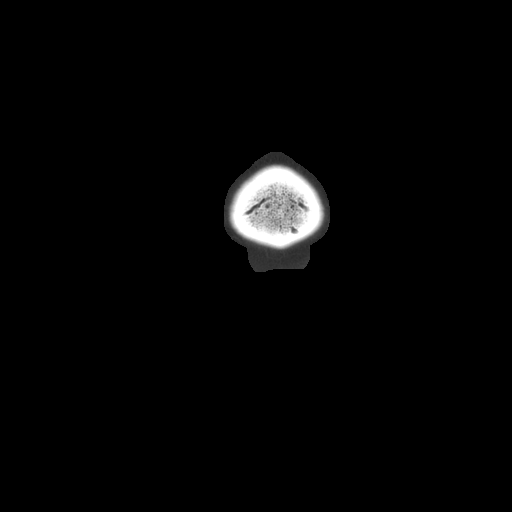
[im 15/16  bone]
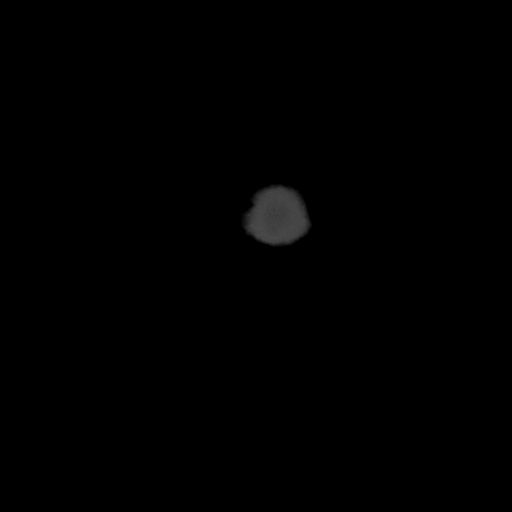

[14 of 16 positions shown; findings below may reference images not displayed]

FINDINGS: Mucosal thickening involving the ethmoid and bilateral
maxillary sinuses compatible with chronic sinusitis.  No sinus
opacification or air-fluid levels.  Bony sinus walls are intact.
Mucosal thickening encroaching on the ostiomeatal units.
IMPRESSION: Changes consistent with chronic ethmoid and bimaxillary sinusitis.

## 2009-07-29 ENCOUNTER — Encounter (INDEPENDENT_AMBULATORY_CARE_PROVIDER_SITE_OTHER): Payer: Self-pay | Admitting: *Deleted

## 2009-07-29 DIAGNOSIS — F17200 Nicotine dependence, unspecified, uncomplicated: Secondary | ICD-10-CM

## 2009-08-22 ENCOUNTER — Encounter: Payer: Self-pay | Admitting: Family Medicine

## 2009-08-22 ENCOUNTER — Ambulatory Visit: Payer: Self-pay | Admitting: Family Medicine

## 2009-08-22 LAB — CONVERTED CEMR LAB
AST: 10 units/L (ref 0–37)
Albumin: 4.5 g/dL (ref 3.5–5.2)
Alkaline Phosphatase: 92 units/L (ref 39–117)
CO2: 23 meq/L (ref 19–32)
Calcium: 9.8 mg/dL (ref 8.4–10.5)
Direct LDL: 132 mg/dL — ABNORMAL HIGH
Glucose, Bld: 301 mg/dL — ABNORMAL HIGH (ref 70–99)
Potassium: 3.9 meq/L (ref 3.5–5.3)
Total Bilirubin: 0.3 mg/dL (ref 0.3–1.2)
Total Protein: 7.2 g/dL (ref 6.0–8.3)

## 2009-08-25 ENCOUNTER — Ambulatory Visit: Payer: Self-pay | Admitting: Family Medicine

## 2009-10-27 ENCOUNTER — Emergency Department (HOSPITAL_COMMUNITY): Admission: EM | Admit: 2009-10-27 | Discharge: 2009-10-27 | Payer: Self-pay | Admitting: Emergency Medicine

## 2009-10-27 ENCOUNTER — Encounter: Payer: Self-pay | Admitting: Family Medicine

## 2009-10-27 IMAGING — CT CT HEAD W/O CM
1 series · 16 of 30 positions shown, 20 images · non-contrast
Comparison: [DATE]

CLINICAL DATA: Headache, nausea, vomiting.

CT HEAD WITHOUT CONTRAST
TECHNIQUE: Contiguous axial images were obtained from the base of
the skull through the vertex without contrast.

[Series 2: head routine 4.8 h37s · axial · 0.43mm/px · z∈[+1014,+1143]mm · 16 of 30 slices shown, 20 images]
[im 2/30  brain]
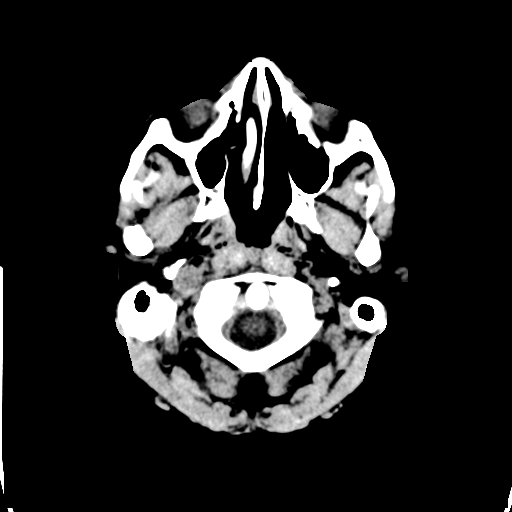
[im 2/30  bone]
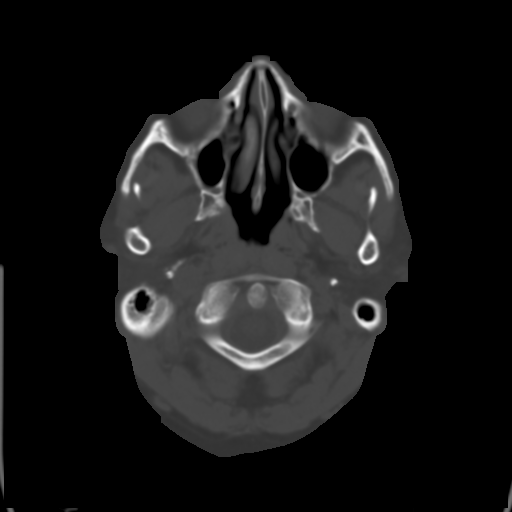
[im 4/30  brain]
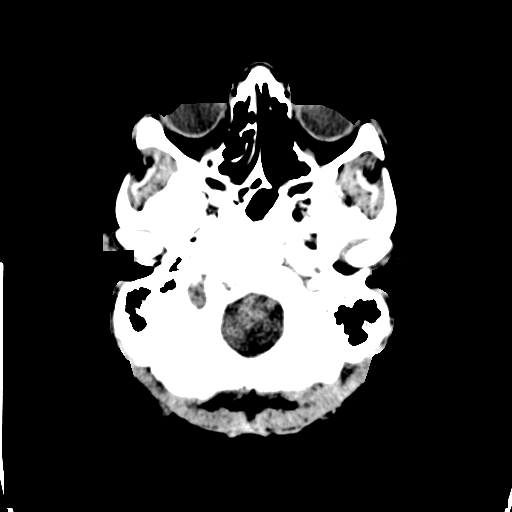
[im 6/30  brain]
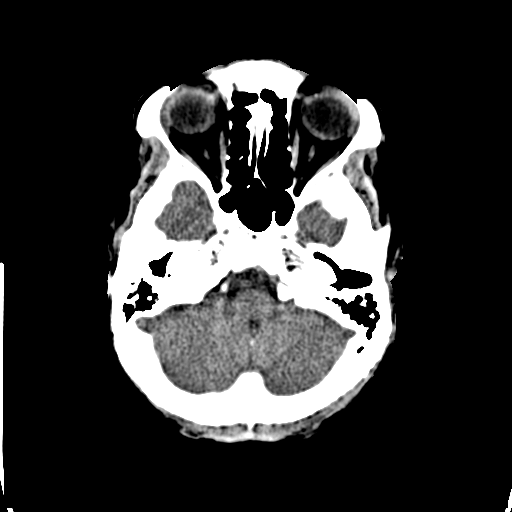
[im 8/30  brain]
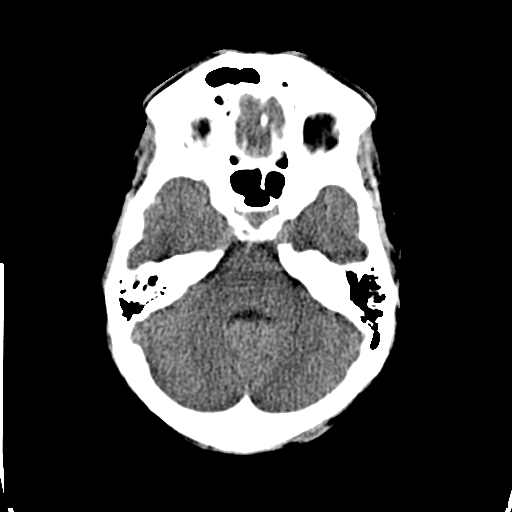
[im 9/30  brain]
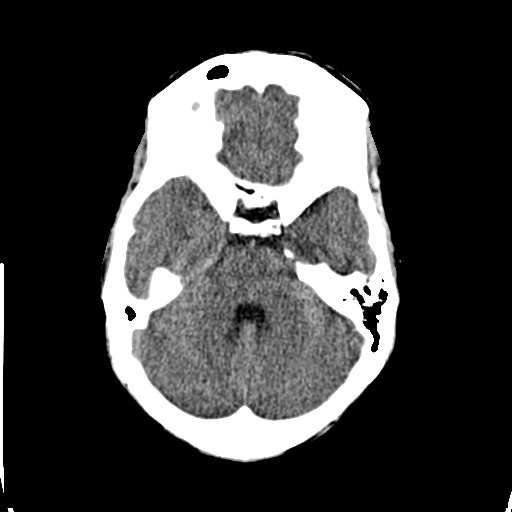
[im 9/30  bone]
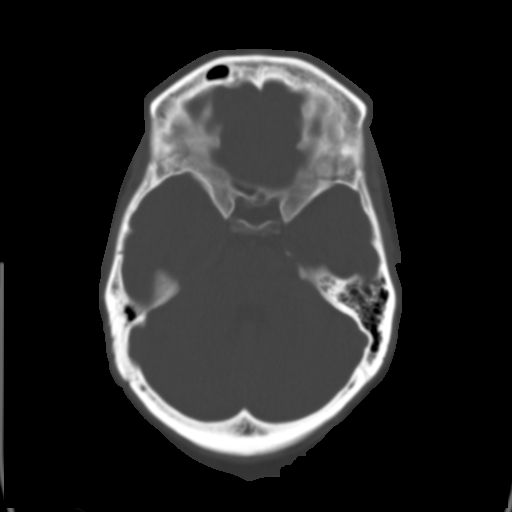
[im 11/30  brain]
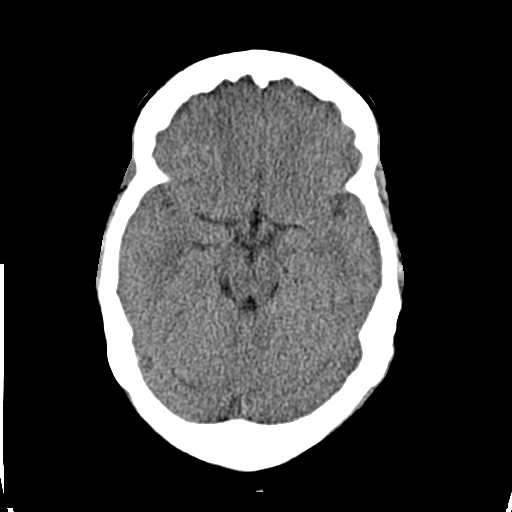
[im 13/30  brain]
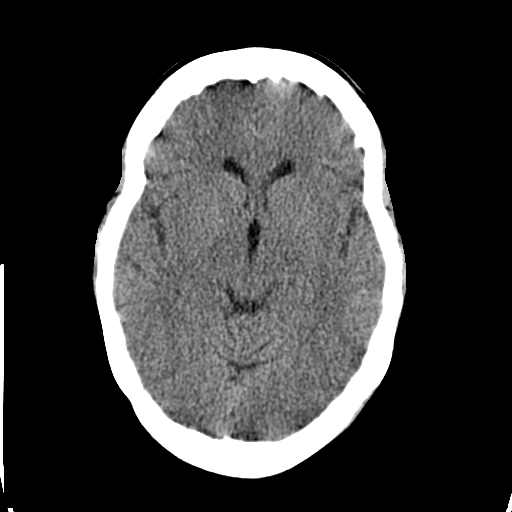
[im 15/30  brain]
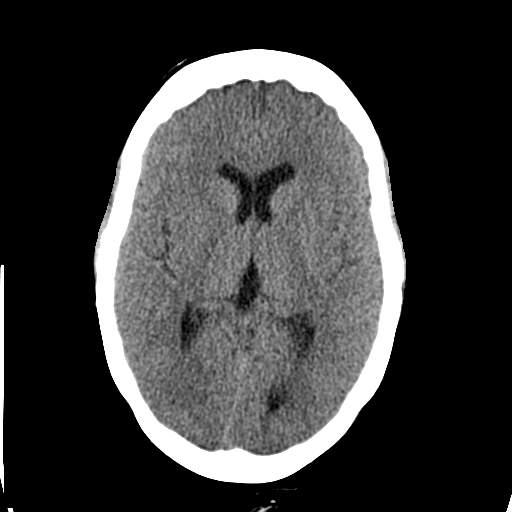
[im 16/30  brain]
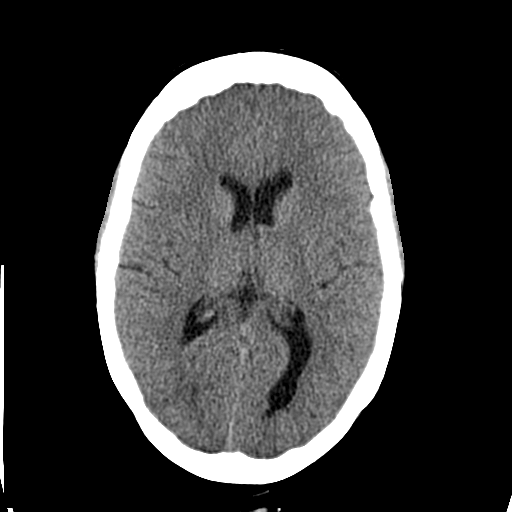
[im 16/30  bone]
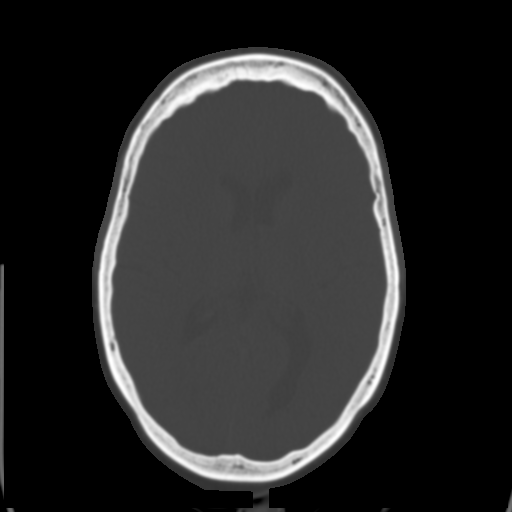
[im 18/30  brain]
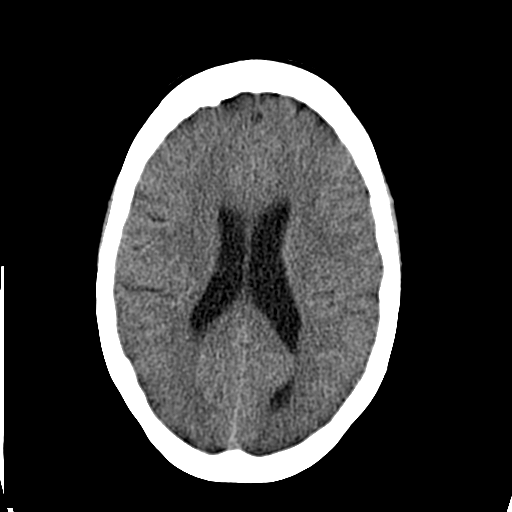
[im 20/30  brain]
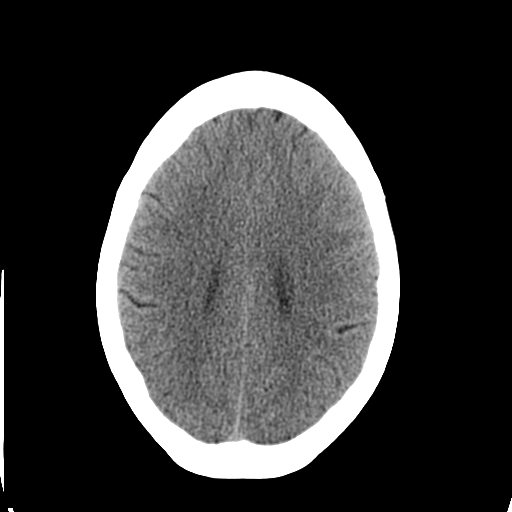
[im 22/30  brain]
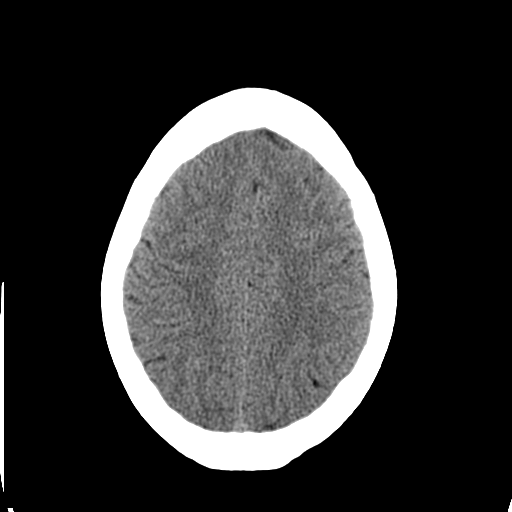
[im 23/30  brain]
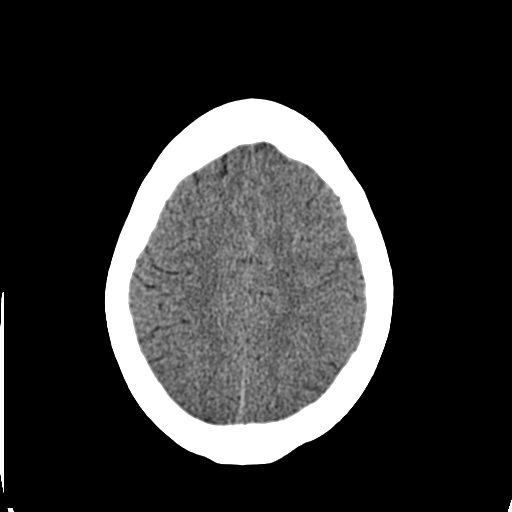
[im 23/30  bone]
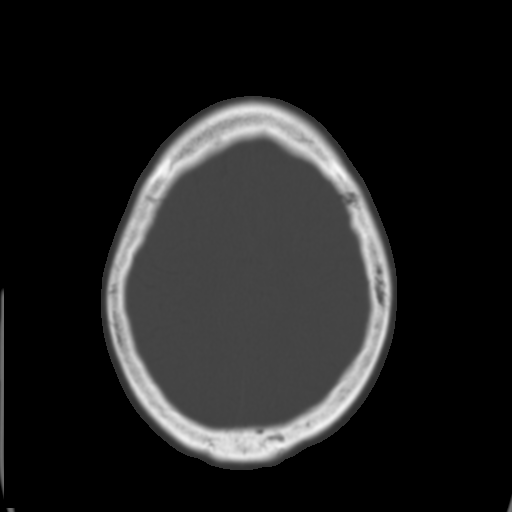
[im 25/30  brain]
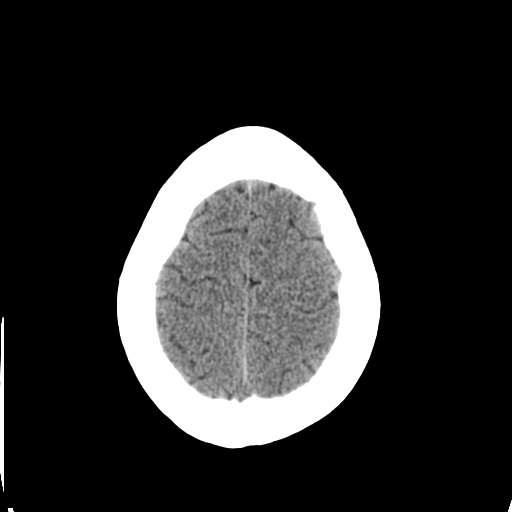
[im 27/30  brain]
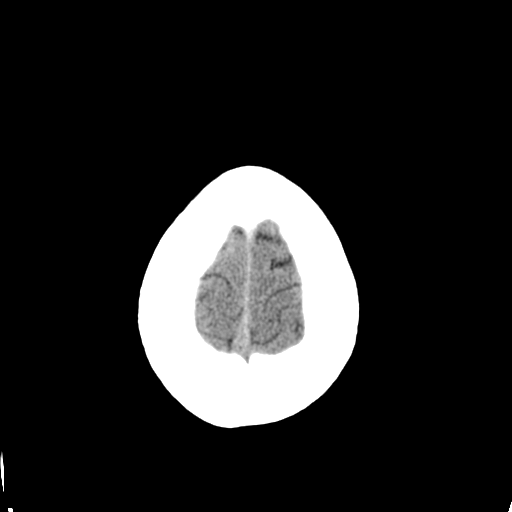
[im 29/30  brain]
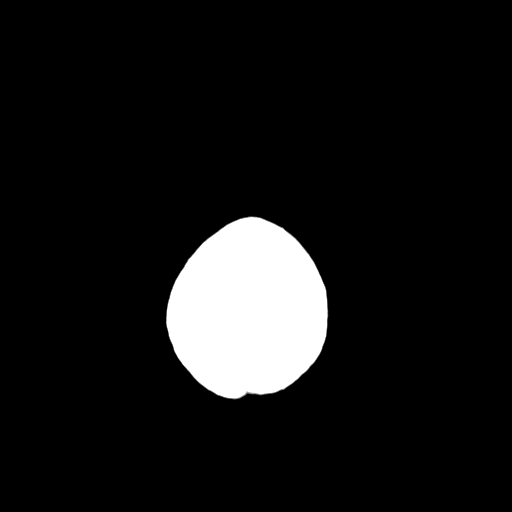

[16 of 30 positions shown; findings below may reference images not displayed]

FINDINGS: No acute intracranial abnormality.  Specifically, no
hemorrhage, hydrocephalus, mass lesion, acute infarction, or
significant intracranial injury.  No acute calvarial abnormality.

Visualized paranasal sinuses and mastoids clear.  Orbital soft
tissues unremarkable.
IMPRESSION: No acute intracranial abnormality.

## 2009-12-22 ENCOUNTER — Ambulatory Visit: Payer: Self-pay | Admitting: Family Medicine

## 2009-12-22 LAB — CONVERTED CEMR LAB
Blood in Urine, dipstick: NEGATIVE
Protein, U semiquant: NEGATIVE
pH: 5.5

## 2009-12-26 ENCOUNTER — Encounter: Payer: Self-pay | Admitting: Family Medicine

## 2010-01-02 ENCOUNTER — Telehealth: Payer: Self-pay | Admitting: Family Medicine

## 2010-01-03 ENCOUNTER — Encounter: Payer: Self-pay | Admitting: Family Medicine

## 2010-01-08 ENCOUNTER — Encounter: Payer: Self-pay | Admitting: Family Medicine

## 2010-01-08 ENCOUNTER — Ambulatory Visit: Payer: Self-pay | Admitting: Family Medicine

## 2010-01-08 DIAGNOSIS — E049 Nontoxic goiter, unspecified: Secondary | ICD-10-CM | POA: Insufficient documentation

## 2010-01-08 LAB — CONVERTED CEMR LAB
HCT: 39.5 % (ref 36.0–46.0)
MCHC: 34.2 g/dL (ref 30.0–36.0)
MCV: 80.6 fL (ref 78.0–100.0)
RDW: 15.2 % (ref 11.5–15.5)
TSH: 1.067 microintl units/mL (ref 0.350–4.500)

## 2010-01-09 ENCOUNTER — Telehealth (INDEPENDENT_AMBULATORY_CARE_PROVIDER_SITE_OTHER): Payer: Self-pay | Admitting: *Deleted

## 2010-01-09 ENCOUNTER — Encounter: Payer: Self-pay | Admitting: Family Medicine

## 2010-01-09 ENCOUNTER — Ambulatory Visit (HOSPITAL_COMMUNITY): Admission: RE | Admit: 2010-01-09 | Discharge: 2010-01-09 | Payer: Self-pay | Admitting: Family Medicine

## 2010-01-09 IMAGING — US US SOFT TISSUE HEAD/NECK
1 series · 13 of 25 positions shown · non-contrast
Comparison: None.

CLINICAL DATA: History of goiter

THYROID ULTRASOUND
TECHNIQUE: Ultrasound examination of the thyroid gland and
adjacent soft tissues was performed.

[Series 1: us soft tissue head/neck · 0.09mm/px · 13 of 48 slices shown]
[im 1/48]
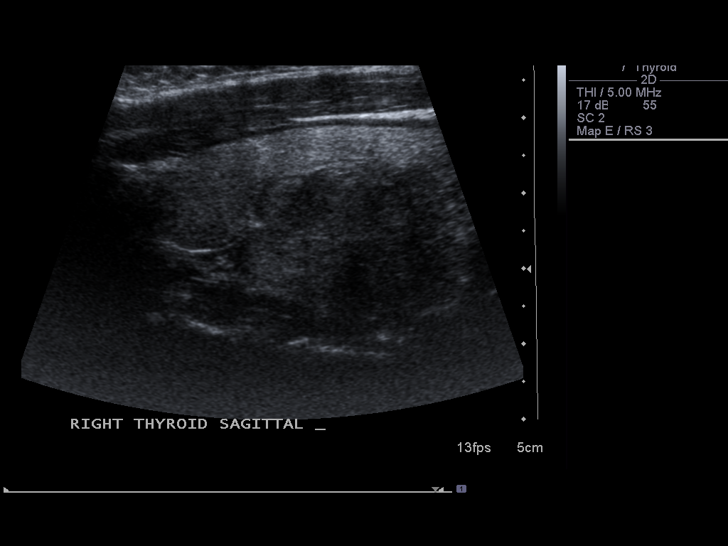
[im 4/48]
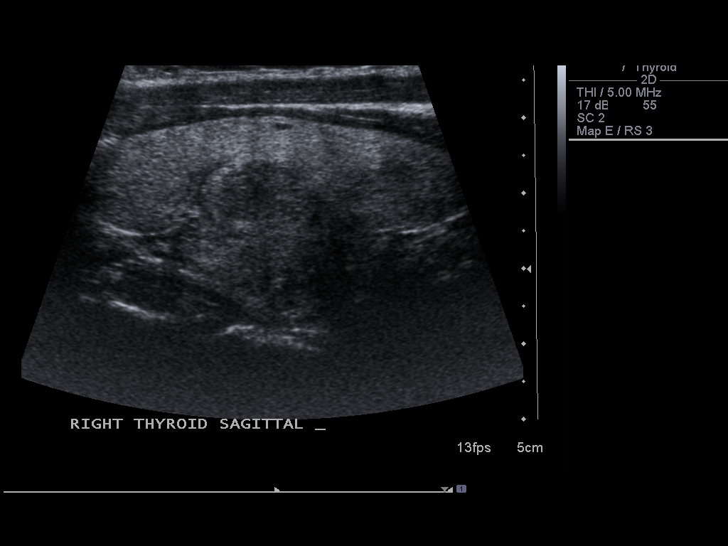
[im 8/48]
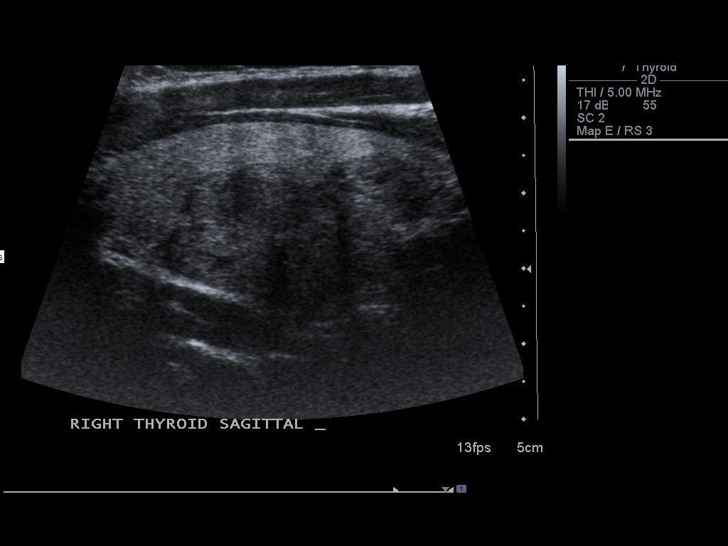
[im 12/48]
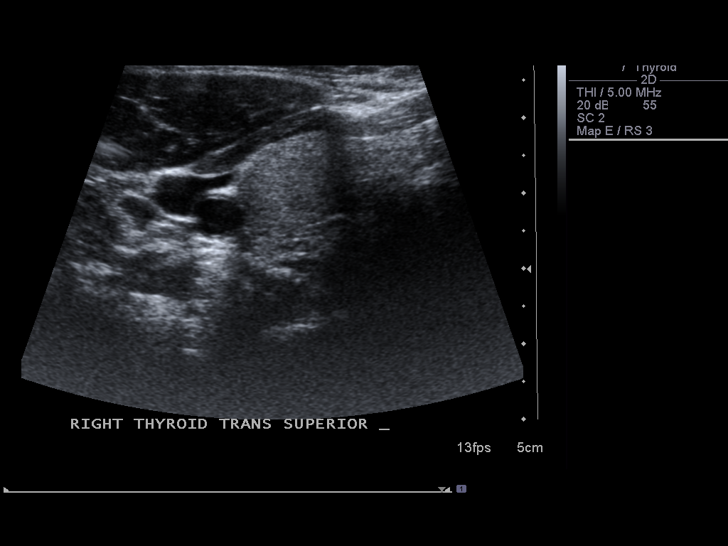
[im 16/48]
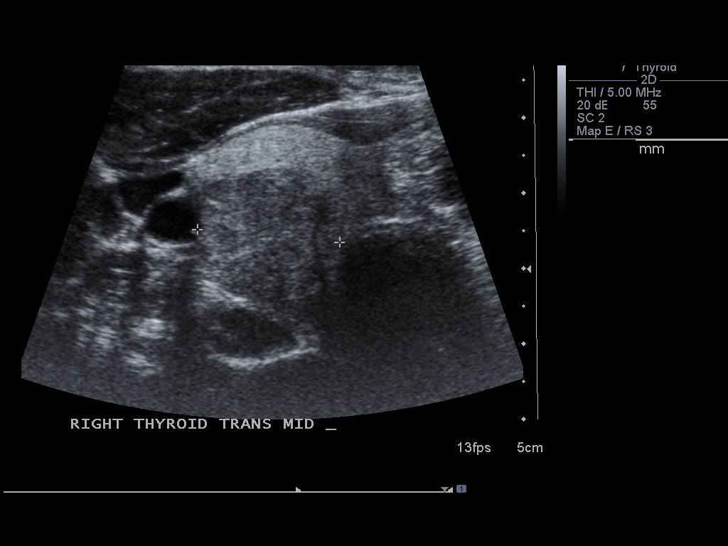
[im 20/48]
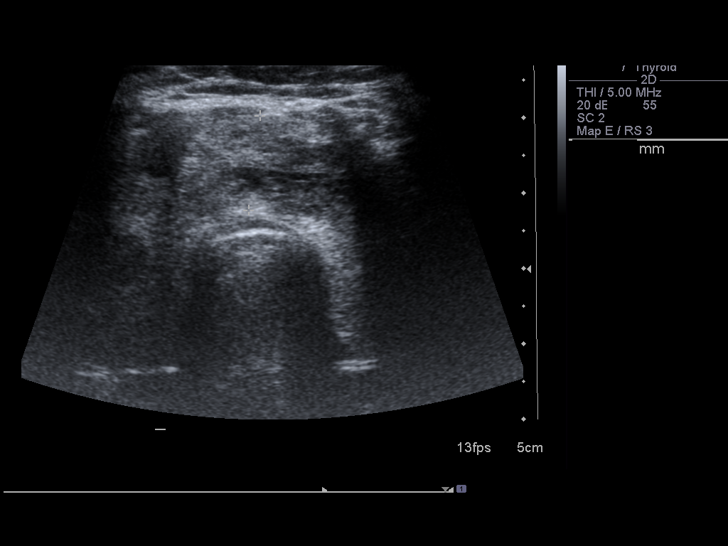
[im 24/48]
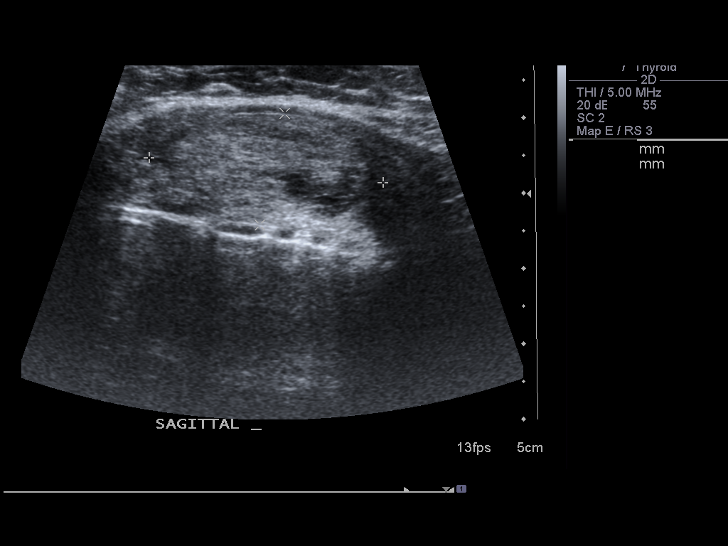
[im 28/48]
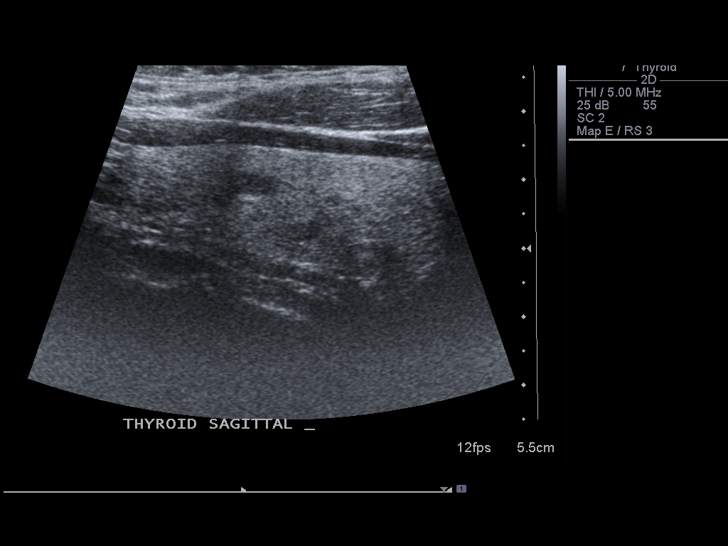
[im 32/48]
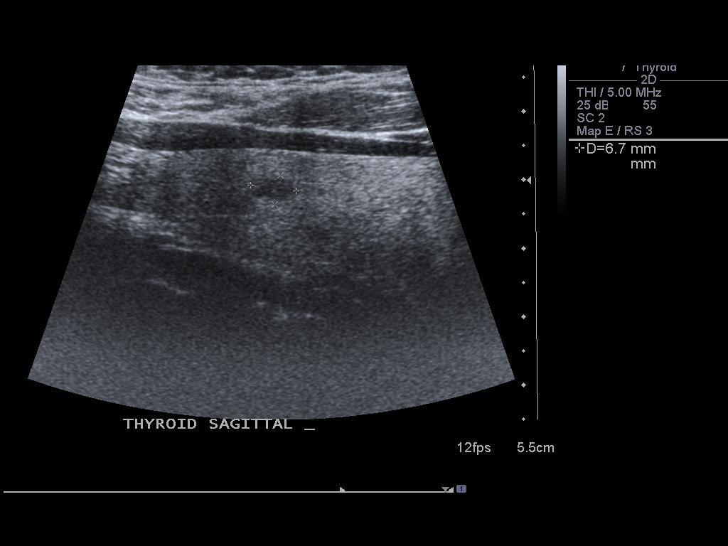
[im 36/48]
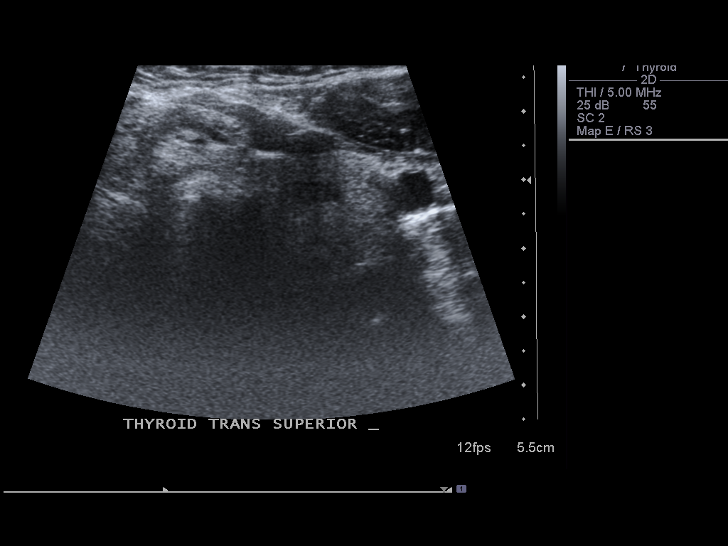
[im 40/48]
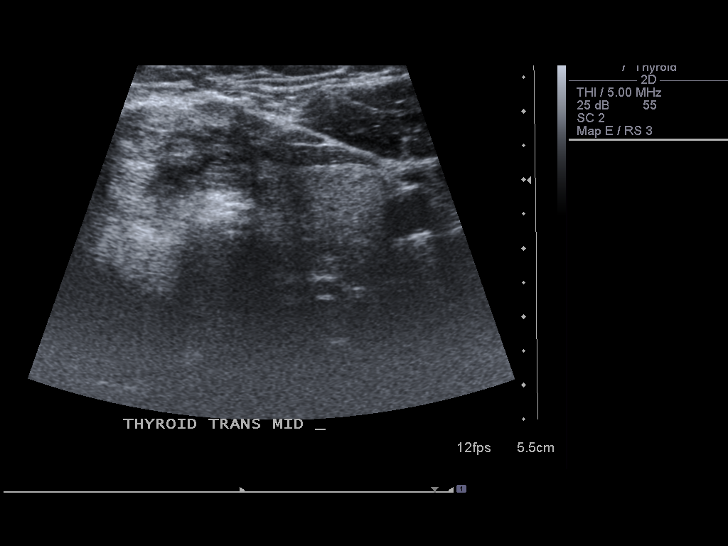
[im 44/48]
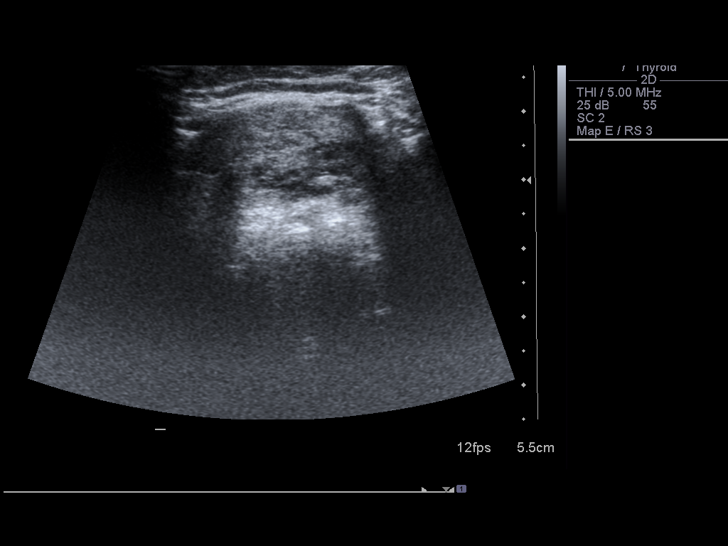
[im 48/48]
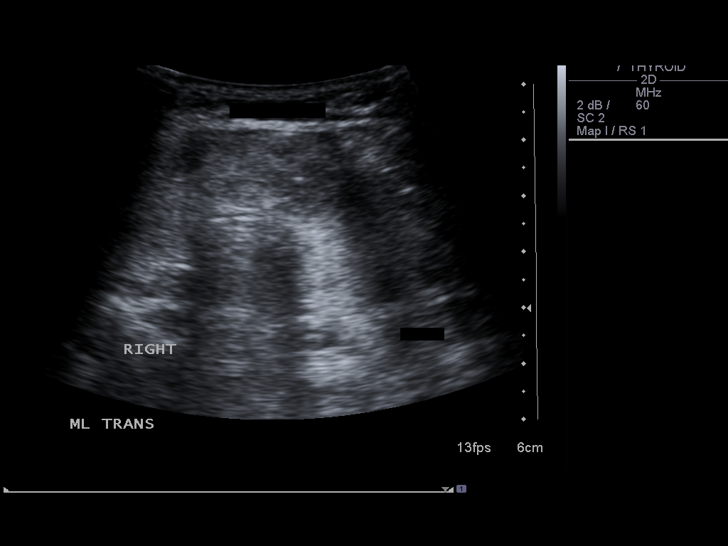

[13 of 25 positions shown; findings below may reference images not displayed]

FINDINGS: The right lobe of the thyroid measured 5.2 x 2.8 x 2.2 cm
with volume of 16.0 mL.

Left lobe of the thyroid measures 5.0 x 1.7 x 1.9 cm with volume of
8.0 mL.

AP diameter of the isthmus is 1.3 cm.

Overall thyroid volume is 24 mL. This is indicative of thyromegaly
and goiter.

The thyroid parenchymal texture was inhomogeneous.  Multiple focal
lesions were identified.

Nodular area in the isthmus measures 3.1 x 1.5 x 2.6 cm. This is
nearly isoechoic to slightly hypoechoic and appears solid.  No
calcifications are associated with it.  There is some internal
color Doppler flow.

The nodular area in the midportion of right lobe of thyroid
measures 2.7 x 2.40 x 1.9 cm. This is slightly hypoechoic and
appears solid. No calcifications are seen within it.  There is no
internal color Doppler flow.

Nodular area and inferior aspect of right lobe of the thyroid
measures 1.5 x 1.1 x 1.5 cm. This is slightly hypoechoic and
appears solid.  No calcifications are associated with it. There is
no internal color Doppler flow evident.

The nodular area in the upper midportion of the left lobe of
thyroid measures 0.7 x 0.4 x 0.8 cm. This area is hypoechoic with
smooth margins with no calcifications and minimal internal color
Doppler flow.

Nodular area in lower midportion of left lobe of thyroid measures
0.6 x 0.4 x 0.6 cm. This area is hypoechoic with smooth margins
with no calcifications and no internal color Doppler flow.
IMPRESSION: There is
overall thyromegaly consistent with goiter.  This appears to be a
multinodular goiter.  The overall volume of the thyroid gland
measured 24 ml.

Multiple solid appearing nodular areas are seen within the thyroid
gland and are detailed above.

Again the multiple nodular areas are most likely associated with
multinodular goiter.

Recommend strong consideration of ultrasound-guided fine needle
aspiration biopsy of the largest masses which are solid and have a
diameter of greater than 1.5 cm.  This includes the nodular mass
in the isthmus and the two masses in the lower pole of the right
lobe of the thyroid.

This recommendation follows the consensus statement:  "Management
of Thyroid Nodules Detected as US:  Society of Radiologists in
Ultrasound Consensus Conference Statement".  Radiology [[place]J];
[DATE].  Available online at :
[URL]

## 2010-01-10 ENCOUNTER — Telehealth: Payer: Self-pay | Admitting: Family Medicine

## 2010-01-12 ENCOUNTER — Encounter: Payer: Self-pay | Admitting: Family Medicine

## 2010-01-23 ENCOUNTER — Encounter: Payer: Self-pay | Admitting: Family Medicine

## 2010-08-21 ENCOUNTER — Encounter: Payer: Self-pay | Admitting: Family Medicine

## 2010-09-28 ENCOUNTER — Ambulatory Visit: Payer: Self-pay | Admitting: Family Medicine

## 2010-11-06 ENCOUNTER — Emergency Department (HOSPITAL_COMMUNITY)
Admission: EM | Admit: 2010-11-06 | Discharge: 2010-11-06 | Payer: Self-pay | Source: Home / Self Care | Admitting: Emergency Medicine

## 2010-11-20 NOTE — Assessment & Plan Note (Signed)
Summary: bp med refills/eo     Vital Signs:  Patient profile:   45 year old female Height:      62.5 inches Weight:      178 pounds BMI:     32.15 Temp:     97.9 degrees F oral Pulse rate:   87 / minute BP sitting:   121 / 86  (right arm) Cuff size:   regular  Vitals Entered By: Tessie Fass CMA (September 28, 2010 9:29 AM) CC: F/U BP Pain Assessment Patient in pain? no        Primary Care Provider:  Luretha Murphy NP  CC:  F/U BP.  History of Present Illness: Angry and upset because she was on a statin and that it made her muscles weak.  This was never reported in a visit.  She was on simvastatin 80, with normal LFTs and no complaints until today.  She states her pharmacist informed her of this.  When she was told that we could reduce her dose she refused.  She was also informed that not taking could increase her risk of stroke and heart disease, she said that she did not care.  She was fired from her job, and now does not have insurance.  She does not want to be certified by the hospital and plans to find another clinic where she can get care.  She wanted her DM and BP meds filled, she refused labs of any type.  I asked that if she finds another provider to contact our office so we would know.  Habits & Providers  Alcohol-Tobacco-Diet     Tobacco Status: current     Tobacco Counseling: to quit use of tobacco products     Cigarette Packs/Day: 1.0  Allergies: 1)  ! Bactrim  Social History: Packs/Day:  1.0  Physical Exam  General:  Sat with her coat on, arms folded, did not make eye contact, was very angry.   Impression & Recommendations:  Problem # 1:  HYPERLIPIDEMIA (ICD-272.4) refusing to take a statin The following medications were removed from the medication list:    Simvastatin 80 Mg Tabs (Simvastatin) ..... One daily  Problem # 2:  HYPERTENSION, BENIGN SYSTEMIC (ICD-401.1) refilled until she can find another provider Her updated medication list for  this problem includes:    Enalapril-hydrochlorothiazide 10-25 Mg Tabs (Enalapril-hydrochlorothiazide) ..... One daily  Orders: [facility]- Est Level  2 (60109)  Problem # 3:  DIABETES MELLITUS II, UNCOMPLICATED (ICD-250.00) refilled meds until she can find another provider Her updated medication list for this problem includes:    Enalapril-hydrochlorothiazide 10-25 Mg Tabs (Enalapril-hydrochlorothiazide) ..... One daily    Aspirin 81 Mg Tbec (Aspirin) ..... One by mouth every day    Metformin Hcl 1000 Mg Tabs (Metformin hcl) ..... Bid    Glimepiride 4 Mg Tabs (Glimepiride) ..... One daily  Orders: [facility]- Est Level  2 (32355)  Problem # 4:  TOBACCO USER (ICD-305.1) counseled to quit that made her more upset.  Complete Medication List: 1)  Enalapril-hydrochlorothiazide 10-25 Mg Tabs (Enalapril-hydrochlorothiazide) .... One daily 2)  Aspirin 81 Mg Tbec (Aspirin) .... One by mouth every day 3)  Metformin Hcl 1000 Mg Tabs (Metformin hcl) .... Bid 4)  Glimepiride 4 Mg Tabs (Glimepiride) .... One daily  Patient Instructions: 1)  Please schedule a follow-up appointment in 6 month.  Prescriptions: GLIMEPIRIDE 4 MG TABS (GLIMEPIRIDE) one daily  #90 x 3   Entered and Authorized by:   Luretha Murphy NP  Signed by:   Luretha Murphy NP on 09/28/2010   Method used:   Print then Give to Patient   RxID:   [id number]   Handout requested. METFORMIN HCL 1000 MG TABS (METFORMIN HCL) bid  #180 x 3   Entered and Authorized by:   Luretha Murphy NP   Signed by:   Luretha Murphy NP on 09/28/2010   Method used:   Print then Give to Patient   RxID:   [id number]   Handout requested. ENALAPRIL-HYDROCHLOROTHIAZIDE 10-25 MG  TABS (ENALAPRIL-HYDROCHLOROTHIAZIDE) one daily  #90 x 3   Entered and Authorized by:   Luretha Murphy NP   Signed by:   Luretha Murphy NP on 09/28/2010   Method used:   Print then Give to Patient   RxID:   [id number]   Handout requested.    Orders Added: 1)  [facility]- Est Level  2  [29937]
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
## 2010-11-20 NOTE — Miscellaneous (Signed)
Summary: ENT referral  Clinical Lists Changes  Orders: Added new Referral order of ENT Referral (ENT) - Signed Patient has apointment with Dr. Annalee Genta on 4/3 11 to follow her mulitnodular goiter. Luretha Murphy NP  January 11, 2010 9:59 AM

## 2010-11-20 NOTE — Miscellaneous (Signed)
Summary: severe HA x 1 wk  Clinical Lists Changes Darel Hong, a nurse with [facility] called & left a message about this pt. she works with her. Pt had been c/o ha,dizzy, altered mental status. bp very elevated. had not taken bp med today. R arm pain & back pain. They sent her to ED.[place] RN  October 27, 2009 9:52 AM

## 2010-11-20 NOTE — Miscellaneous (Signed)
  Clinical Lists Changes  Medications: Changed medication from ZOLOFT 50 MG TABS (SERTRALINE HCL) 1/2 tab for 1 week then one tab [BMN] to SERTRALINE HCL 50 MG TABS (SERTRALINE HCL) one daily - Signed Rx of SERTRALINE HCL 50 MG TABS (SERTRALINE HCL) one daily;  #90 x 3;  Signed;  Entered by: Luretha Murphy NP;  Authorized by: Luretha Murphy NP;  Method used: Electronically to [id number] [address] [place], [place]  [address], Ph: [phone] or [phone], Fax: [phone]    Prescriptions: SERTRALINE HCL 50 MG TABS (SERTRALINE HCL) one daily  #90 x 3   Entered and Authorized by:   Luretha Murphy NP   Signed by:   Luretha Murphy NP on 12/26/2009   Method used:   Electronically to        [facility] (retail)       [address]       [place], [place]  [address]       Ph: [phone] or [phone]       Fax: [phone]   RxID:   [id number]
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
## 2010-11-20 NOTE — Miscellaneous (Signed)
  Clinical Lists Changes  Medications: Changed medication from ENALAPRIL-HYDROCHLOROTHIAZIDE 10-25 MG  TABS (ENALAPRIL-HYDROCHLOROTHIAZIDE) one daily to ENALAPRIL-HYDROCHLOROTHIAZIDE 10-25 MG  TABS (ENALAPRIL-HYDROCHLOROTHIAZIDE) one daily - Signed Rx of ENALAPRIL-HYDROCHLOROTHIAZIDE 10-25 MG  TABS (ENALAPRIL-HYDROCHLOROTHIAZIDE) one daily;  #90 x 3;  Signed;  Entered by: Luretha Murphy NP;  Authorized by: Luretha Murphy NP;  Method used: Faxed to [facility], [address], [address], [place], [place]  [address], Ph: [phone], Fax: [phone]    Prescriptions: ENALAPRIL-HYDROCHLOROTHIAZIDE 10-25 MG  TABS (ENALAPRIL-HYDROCHLOROTHIAZIDE) one daily  #90 x 3   Entered and Authorized by:   Luretha Murphy NP   Signed by:   Luretha Murphy NP on 01/03/2010   Method used:   Faxed to ...       [facility] (retail)       [address] [place]       [address]       [place], [place]  [address]       Ph: [phone]       Fax: [phone]   RxID:   [id number]

## 2010-11-20 NOTE — Consult Note (Signed)
Summary: [facility]  [facility]   Imported By: Clydell Hakim 02/01/2010 09:19:59  _____________________________________________________________________  External Attachment:    Type:   Image     Comment:   External Document

## 2010-11-20 NOTE — Miscellaneous (Signed)
  Clinical Lists Changes  Problems: Removed problem of ABDOMINAL PAIN, LEFT LOWER QUADRANT (ICD-789.04) Removed problem of DYSURIA (ICD-788.1) Removed problem of COSTOCHONDRITIS, ACUTE (ICD-733.6) Removed problem of ACUTE BRONCHITIS (ICD-466.0) Removed problem of HERPES ZOSTER (ICD-053.9) Removed problem of VAGINAL DISCHARGE (ICD-623.5) Removed problem of FATIGUE (ICD-780.79) Removed problem of SCREENING FOR MALIGNANT NEOPLASM OF THE CERVIX (ICD-V76.2) Removed problem of SINUSITIS, ACUTE NOS (ICD-461.9) Removed problem of MENORRHAGIA (ICD-626.2)

## 2010-11-20 NOTE — Assessment & Plan Note (Signed)
Summary: pelvic pain,df   Vital Signs:  Patient profile:   45 year old female Weight:      183.3 pounds Pulse rate:   81 / minute BP sitting:   147 / 88  (right arm)  Vitals Entered By: Arlyss Repress CMA, (December 22, 2009 1:59 PM) CC: dysuria and lower pelvic pain x 1 week. not sexually active. Is Patient Diabetic? Yes Pain Assessment Patient in pain? yes     Location: pelvis Intensity: 4 Onset of pain  x 1 week   Primary Care Provider:  Luretha Murphy NP  CC:  dysuria and lower pelvic pain x 1 week. not sexually active.Grace Ferrell  History of Present Illness: Pressure in her left lower quadrant, that hurts when she voids.  Denies any sexual contacts for the past 6 months.  Had uterine ablation in 2007, has not had a period since.  Did follow up with GYN for a while after the procedure.    Prone to constipation , small stools.    Taking 1/2 Zoloft, not every day but can tell that it helps.  Habits & Providers  Alcohol-Tobacco-Diet     Tobacco Status: current     Tobacco Counseling: to quit use of tobacco products  Current Medications (verified): 1)  Enalapril-Hydrochlorothiazide 10-25 Mg  Tabs (Enalapril-Hydrochlorothiazide) .... One Daily 2)  Simvastatin 80 Mg Tabs (Simvastatin) .... One Daily 3)  Aspirin 81 Mg  Tbec (Aspirin) .... One By Mouth Every Day 4)  Metformin Hcl 1000 Mg Tabs (Metformin Hcl) .... Bid 5)  Glimepiride 4 Mg Tabs (Glimepiride) .... One Daily 6)  Zoloft 50 Mg Tabs (Sertraline Hcl) .... 1/2 Tab For 1 Week Then One Tab 7)  Polyethylene Glycol 3350  Powd (Polyethylene Glycol 3350) .... One Capful Daily, Qs  Allergies (verified): 1)  ! Bactrim  Physical Exam  General:  Well appearing, less depressed than in the past. Lungs:  normal respiratory effort and normal breath sounds.   Heart:  normal rate and regular rhythm.   Abdomen:  soft, tender in LLQ   Impression & Recommendations:  Problem # 1:  DYSURIA (ICD-788.1) Normal UA The following medications  were removed from the medication list:    Zithromax Z-pak 250 Mg Tabs (Azithromycin) .Grace Ferrell... As directed  Orders: Urinalysis-[facility] (00000) [facility]- Est Level  3 ([address])  Problem # 2:  DEPRESSIVE DISORDER, NOS (ICD-311) Assessment: Improved  Her updated medication list for this problem includes:    Zoloft 50 Mg Tabs (Sertraline hcl) .Grace Ferrell... 1/2 tab for 1 week then one tab  Orders: [facility]- Est Level  3 ([address])  Problem # 3:  ABDOMINAL PAIN, LEFT LOWER QUADRANT (ICD-789.04) Fist will try to relieve constipation with PEG, if not better in one week will schedule pelvic US and go from there.  Patient to call in one week  Complete Medication List: 1)  Enalapril-hydrochlorothiazide 10-25 Mg Tabs (Enalapril-hydrochlorothiazide) .... One daily 2)  Simvastatin 80 Mg Tabs (Simvastatin) .... One daily 3)  Aspirin 81 Mg Tbec (Aspirin) .... One by mouth every day 4)  Metformin Hcl 1000 Mg Tabs (Metformin hcl) .... Bid 5)  Glimepiride 4 Mg Tabs (Glimepiride) .... One daily 6)  Zoloft 50 Mg Tabs (Sertraline hcl) .... 1/2 tab for 1 week then one tab 7)  Polyethylene Glycol 3350 Powd (Polyethylene glycol 3350) .... One capful daily, qs  Patient Instructions: 1)  Begin using the laxative, give it one week to clean things out.  For the first few days you can take it  twice daily. 2)  If you are still feeling the pelvic congestion call and I will schedule a vaginal [place] of your pelvis. 3)  Begin Simvastatin at 80 mg 4)  Return in May for PAP, CPE, try to come in fasting if you can. Prescriptions: POLYETHYLENE GLYCOL 3350  POWD (POLYETHYLENE GLYCOL 3350) one capful daily, QS  #1 x 3   Entered and Authorized by:   Grace Murphy NP   Signed by:   Grace Murphy NP on 12/22/2009   Method used:   [facility] to        [phone][facility] (retail)       [address] [place], [place]  [address]       Ph: [phone]       Fax: [phone]   RxID:   [id number] ZOLOFT 50 MG TABS (SERTRALINE HCL) 1/2  tab for 1 week then one tab Brand medically necessary #30 x 6   Entered and Authorized by:   Grace Murphy NP   Signed by:   Grace Murphy NP on 12/22/2009   Method used:   Electronically to        [phone]* (retail)       [address] [place], [place]  [address]       Ph: [phone]       Fax: [phone]   RxID:   [id number] SIMVASTATIN 80 MG TABS (SIMVASTATIN) one daily  #90 x 3   Entered and Authorized by:   Grace Murphy NP   Signed by:   Grace Murphy NP on 12/22/2009   Method used:   Electronically to        [facility] [phone]* (retail)       [address] [place], [place]  [address]       Ph: [phone]       Fax: [phone]   RxID:   [id number]    Prevention & Chronic Care Immunizations   Influenza vaccine: given  (08/17/2008)   Influenza vaccine due: 06/21/2010    Tetanus booster: 12/25/2005: Done.   Tetanus booster due: 12/26/2015    Pneumococcal vaccine: Not documented  Other Screening   Pap smear: normal  (01/25/2008)   Pap smear action/deferral: Deferred-3 yr interval  (08/22/2009)   Pap smear due: 01/24/2009    Mammogram: normal  (06/27/2008)   Mammogram due: 06/27/2009   Smoking status: current  (12/22/2009)   Smoking cessation counseling: yes  (10/03/2008)  Diabetes Mellitus   HgbA1C: 8.6  (08/22/2009)   HgbA1C action/deferral: Ordered  (12/22/2009)   Hemoglobin A1C due: 04/25/2008    Eye exam: Not documented   Diabetic eye exam action/deferral: Refused  (08/22/2009)    Foot exam: Not documented   Foot exam action/deferral: Do today   High risk foot: No  (08/22/2009)   Foot care education: Done  (08/22/2009)    Urine microalbumin/creatinine ratio: Not documented   Urine microalbumin action/deferral: Not indicated   Urine microalbumin/cr due: 03/22/2009    Diabetes flowsheet reviewed?: Yes   Progress toward A1C goal: Unchanged  Lipids   Total Cholesterol: 222  (01/25/2008)   Lipid panel  action/deferral: LDL Direct ordered   LDL: 546  (01/25/2008)   LDL Direct: 132  (08/22/2009)   HDL: 45  (01/25/2008)   Triglycerides: 157  (01/25/2008)    SGOT (AST): 10  (  08/22/2009)   BMP action: Ordered   SGPT (ALT): <8 U/L  (08/22/2009)   Alkaline phosphatase: 92  (08/22/2009)   Total bilirubin: 0.3  (08/22/2009)  Hypertension   Last Blood Pressure: 147 / 88  (12/22/2009)   Serum creatinine: 0.74  (08/22/2009)   BMP action: Ordered   Serum potassium 3.9  (08/22/2009)  Self-Management Support :   Personal Goals (by the next clinic visit) :     Personal A1C goal: 7  (08/22/2009)     Personal blood pressure goal: 130/80  (08/22/2009)     Personal LDL goal: 100  (08/22/2009)    Diabetes self-management support: Not documented    Hypertension self-management support: Not documented    Lipid self-management support: Not documented      Laboratory Results   Urine Tests  Date/Time Received: December 22, 2009 2:09 PM  Date/Time Reported: December 22, 2009 2:31 PM   Routine Urinalysis   Color: yellow Appearance: Clear Glucose: negative   (Normal Range: Negative) Bilirubin: negative   (Normal Range: Negative) Ketone: negative   (Normal Range: Negative) Spec. Gravity: <1.005   (Normal Range: 1.003-1.035) Blood: negative   (Normal Range: Negative) pH: 5.5   (Normal Range: 5.0-8.0) Protein: negative   (Normal Range: Negative) Urobilinogen: 0.2   (Normal Range: 0-1) Nitrite: negative   (Normal Range: Negative) Leukocyte Esterace: negative   (Normal Range: Negative)    Comments: ...............test performed by......Grace KitchenBonnie A. [place], MLS (ASCP)cm   Blood Tests   Date/Time Received:

## 2010-11-20 NOTE — Progress Notes (Signed)
Summary: F/U last visit  Phone Note Call from Patient   Summary of Call: pt called to let MD know the medication she was prescribed for constipation worked Initial call taken by: Knox Royalty,  January 02, 2010 11:29 AM  Follow-up for Phone Call        fwd to s.Simrit Gohlke Follow-up by: Arlyss Repress CMA,,  January 02, 2010 2:05 PM  Additional Follow-up for Phone Call Additional follow up Details #1::        Good news Additional Follow-up by: Luretha Murphy NP,  January 02, 2010 4:14 PM

## 2010-11-20 NOTE — Progress Notes (Signed)
Summary: referral  Phone Note Call from Patient Call back at Home Phone [phone]   Caller: Patient Summary of Call: pt called for results and was read the letter that Darl Pikes wrote.  pt wants to sched appt for ENT - pls call when appt is made. Initial call taken by: De Nurse,  January 09, 2010 4:23 PM  Follow-up for Phone Call        noted. Follow-up by: Theresia Lo RN,  January 10, 2010 9:42 AM

## 2010-11-20 NOTE — Letter (Signed)
Summary: Generic Letter  [facility]  [address]   [place], [place] [address]   Phone: [phone]  Fax: [phone]    01/09/2010  [facility] [address] [place], [place]  [address]  Dear Ms. Tiffany Kocher, we do not have a current phone number for you.  We will be scheduling a ENT referral.  You have a goiter.  These are usually benign but we should have a specialist follow and biposy.  Please change your number with the office.       Sincerely,   Luretha Murphy NP  Appended Document: Generic Letter mailed
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
## 2010-11-20 NOTE — Progress Notes (Signed)
Summary: Lab Res  Phone Note Call from Patient Call back at [facility] Phone [phone]   Caller: Patient Summary of Call: Checking on lab results. Initial call taken by: Clydell Hakim,  January 10, 2010 3:06 PM  Follow-up for Phone Call        will forward to Luretha Murphy. Follow-up by: Theresia Lo RN,  January 10, 2010 4:17 PM  Additional Follow-up for Phone Call Additional follow up Details #1::        Left message labs normal, goiter to be followed by ENT, RN at clinic will make apt and get back to her. Additional Follow-up by: Luretha Murphy NP,  January 10, 2010 4:20 PM

## 2010-11-28 ENCOUNTER — Encounter: Payer: Self-pay | Admitting: *Deleted

## 2011-01-06 LAB — URINALYSIS, ROUTINE W REFLEX MICROSCOPIC
Bilirubin Urine: NEGATIVE
Hgb urine dipstick: NEGATIVE
Ketones, ur: NEGATIVE mg/dL
Urobilinogen, UA: 1 mg/dL (ref 0.0–1.0)

## 2011-01-06 LAB — POCT I-STAT, CHEM 8
BUN: 11 mg/dL (ref 6–23)
Creatinine, Ser: 0.7 mg/dL (ref 0.4–1.2)
Glucose, Bld: 163 mg/dL — ABNORMAL HIGH (ref 70–99)
HCT: 42 % (ref 36.0–46.0)
TCO2: 29 mmol/L (ref 0–100)

## 2011-01-06 LAB — PREGNANCY, URINE: Preg Test, Ur: NEGATIVE

## 2011-02-04 LAB — RAPID STREP SCREEN (MED CTR MEBANE ONLY): Streptococcus, Group A Screen (Direct): NEGATIVE
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
## 2011-03-08 NOTE — Group Therapy Note (Signed)
NAMEKENIDI, ELENBAAS NO.:  [id number]   MEDICAL RECORD NO.:  [id number]          PATIENT TYPE:  WOC   LOCATION:  [facility]                   FACILITY:  [facility]   PHYSICIAN:  Elsie Lincoln, MD      DATE OF BIRTH:  [birth year]   DATE OF SERVICE:  10/08/2005                                    CLINIC NOTE   Patient is a 45 year old G4, para 4-0-0-4, LMP September 28, 2005, who was  sent to me in consultation by the [facility], in particular,  Luretha Murphy.  She was sent to me for evaluation of menorrhagia and fibroids.  The patient had normal periods up until two years ago, but the past two  years she has become progressively heavier.  Her periods last up to 20 days.  She goes through two boxes of pads each period.  She no longer wears tampons  because it causes increased cramping.  She also had dyspareunia when she did  have sex.  She has not been sexually active for seven months.  She also has  occasional leakage of urine when she coughs or sneezes, however, she does  not feel this is a problem, but she does complain of heaviness in the  vagina, and this is most likely from her cystocele that was discovered on  exam.   PAST MEDICAL HISTORY:  1.  Hypercholesterolemia.  2.  Type 2 diabetes on Metformin.   PAST SURGICAL HISTORY:  BTL after her last surgery.   PAST GYNECOLOGIC HISTORY:  NSVD x4 including one breech delivery.  She has  three living children, as one of her children was born stillborn.  She has a  history of an abnormal Pap smear age 3.  However, she never got any  treatment for it.  She also has a history of a complex ovarian cyst, which  looks likely hemorrhagic corpus luteum September  2006, however, she is  unaware of this.  She also has several fibroids that were noted on the  July 15, 2005 ultrasound.  Her uterus measures 7.8 in length, 5.1 in  width, and 5.6 transverse.  She had two fibroids with some serosal component  measuring  3.8 x 2.6 x 3 and the other one that also has a subserosal  component measures 1.7 x 1.3 x 1.6.  The patient did not know she had  fibroids until this ultrasound.  No history of sexually transmitted  diseases.   FAMILY HISTORY:  Significant for sister with cervical cancer, mother and  sister with diabetes, mother has heart disease, mother and two sisters have  high blood pressure, and her sister has a history of blood clots.   SOCIAL HISTORY:  Significant for half pack of cigarettes a day.  She used to  smoke 2-1/2 packs cigarettes a day.  She has two sons and a daughter, and  she was employed as a Geophysicist/field seismologist.  She denies alcohol and  exercises regularly, per the patient.   HEALTH MAINTENANCE:  Normal Pap smear and BIRADS 1 mammogram on July 11, 2005  MEDICATIONS:  Metformin and Zyrtec as needed.   ALLERGIES:  DENIES.   REVIEW OF SYMPTOMS:  Positive for muscle aches, hot flashes and pain with  intercourse.  All other systemic review was negative.   PHYSICAL EXAMINATION:  VITAL SIGNS:  Blood pressure 125/75, weight 177,  pulse 97, height 5 feet 2 inches.  GENERAL:  Well-nourished, well-developed in no apparent distress.  HEENT:  Normocephalic, atraumatic.  THYROID:  Questionably enlarged thyroid, however, she does have a normal TSH  that I have in the chart.  BREASTS:  Symmetric.  No skin changes. No lymphadenopathy.  ABDOMEN:  No hernia, no organomegaly, soft, non-tender, non-distended, no  rebound, no guarding.  GENITALIA:  Tanner V. Vagina pink, no lesion, no discharge or blood.  Moderate cystocele especially with Valsalva.  Grade 1 to 2 prolapse of the  uterus.  The uterus feels slightly enlarged, mobile, non-tender.  Adnexa:  No masses, non-tender.  RECTAL:  Not done per patient's request.  EXTREMITIES:  Non-tender, no edema.   ASSESSMENT AND PLAN:  A 45 year old female with menorrhagia for two years,  unlikely to be related to thyroid as her TSH is  normal.  She does have  submucosal fibroids, which is most likely her etiology for menorrhagia.  We  had a brief discussion about medical management with Depo-Provera versus  endometrial ablation with hydrotherm ablation versus hysterectomy.  The  patient was slightly overwhelmed with information today, so we will have her  come back in two weeks with the biopsy results and then talk further.  However, if she has heaviness in the vagina, then perhaps TVH with cystocele  and suburethral sling would be the best option.  However, it is the most  invasive.  If we want to try more conservative measures, a hydrotherm  ablation would be the best.  She cannot use OCP secondary to her smoking  history and she is hesitant of all hormonal therapies because she believes  this causes ovarian cancer and cervical cancer; although she was told this  was not true, she still most likely will not agree to medical management.  The patient is to return in two weeks.           ______________________________  Elsie Lincoln, MD     KL/MEDQ  D:  10/08/2005  T:  10/08/2005  Job:  [id number]   cc:   Luretha Murphy, NP
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
## 2011-03-08 NOTE — Group Therapy Note (Signed)
Grace Ferrell, IBE NO.:  [id number]   MEDICAL RECORD NO.:  [id number]          PATIENT TYPE:  WOC   LOCATION:  [facility]                   FACILITY:  [facility]   PHYSICIAN:  Elsie Lincoln, MD      DATE OF BIRTH:  02/21/1966   DATE OF SERVICE:                                    CLINIC NOTE   ADDENDUM:  This is a continuation of [id number].   The patient was given a prescription for Percocet for dysmenorrhea that she  was told to use sparingly, and if she became constipated to increase her  water and fiber intake.           ______________________________  Elsie Lincoln, MD     KL/MEDQ  D:  10/08/2005  T:  10/08/2005  Job:  [id number]

## 2011-03-08 NOTE — Op Note (Signed)
NAMESARI, COGAN                   ACCOUNT NO.:  [id number]   MEDICAL RECORD NO.:  [id number]          PATIENT TYPE:  AMB   LOCATION:  [facility]                           [facility]AC[facility]   PHYSICIAN:  Kathreen Cosier, M.D.DATE OF BIRTH:  02-26-1966   DATE OF PROCEDURE:  12/05/2005  DATE OF DISCHARGE:                                 OPERATIVE REPORT   PREOPERATIVE DIAGNOSIS:  Dysfunctional uterine bleeding.   DESCRIPTION OF PROCEDURE:  Under general anesthesia with the patient in the  lithotomy position, the perineum and vagina were prepped and draped.  The  bladder emptied with a straight catheter.  Bimanual examination revealed the  uterus to be normal size.  There was a small myoma palpable on the left.  A  speculum was placed in the vagina.  Anterior lip of the cervix was grasped  with a tenaculum.  Endocervix curetted.  A small amount of tissue was  obtained.  The endometrial cavity sounded to 8 cm and the cervical length  measured 4 cm.  The cavity length was 4 cm.  Cervix was dilated to a #25  Shawnie Pons.  Hysteroscope inserted.  Sharp curettage was performed and cavity  normal.  The NovaSure device was inserted and the cavity integrity test pass  and NovaSure ablation was performed, 1 minute and 20 seconds at 6 watts.  Hysteroscopy was performed after the ablation and there was total ablation  of the cavity.  The patient tolerated the procedure well and was taken to  the recovery room in good condition.           ______________________________  Kathreen Cosier, M.D.     BAM/MEDQ  D:  12/05/2005  T:  12/05/2005  Job:  [id number]

## 2011-03-08 NOTE — Group Therapy Note (Signed)
NAMEDEBRAH, Grace Ferrell NO.:  [id number]   MEDICAL RECORD NO.:  [id number]          PATIENT TYPE:  WOC   LOCATION:  [facility]                   FACILITY:  [facility]   PHYSICIAN:  Elsie Lincoln, MD      DATE OF BIRTH:  [birth year]   DATE OF SERVICE:                                    CLINIC NOTE   Patient is a 45 year old female, who was seen last clinic visit on October 08, 2005, with menorrhagia.  We did an endometrial biopsy, which was  negative and showed secretory endometrium.  Today, the patient presents for  results of that biopsy and also to discuss treatment options.  For 30  minutes, we discussed Depo, its side effect; hydrothermablation, its risks  and benefits; and also transvaginal hysterectomy, risks and benefits.  I  have even offered her uterine artery embolization, which I told her she was  probably not a candidate for given that they have submucosal components.  The patient is extremely scared of surgery, that something bad could happen.  She has refused surgery in the past as well when she had a breech delivery  of her second baby.  The patient says as long as these conditions are not  life threatening, she does not want to proceed with any treatment.  She also  is noted to have a cystocele last exam, and I told her this was also not  life threatening, but if she wanted it repaired, that we could do this at  the time of transvaginal hysterectomy, so the patient is going to return to  Grace Ferrell for her health maintenance, and she can come to me at any time  if she would like treatment for this problem.           ______________________________  Elsie Lincoln, MD     KL/MEDQ  D:  10/22/2005  T:  10/22/2005  Job:  [id number]   cc:   Grace Ferrell

## 2011-05-10 ENCOUNTER — Ambulatory Visit (INDEPENDENT_AMBULATORY_CARE_PROVIDER_SITE_OTHER): Payer: Self-pay | Admitting: Family Medicine

## 2011-05-10 ENCOUNTER — Encounter: Payer: Self-pay | Admitting: Family Medicine

## 2011-05-10 VITALS — BP 109/76 | HR 89 | Temp 98.2°F | Ht 62.0 in | Wt 165.0 lb

## 2011-05-10 DIAGNOSIS — E049 Nontoxic goiter, unspecified: Secondary | ICD-10-CM

## 2011-05-10 DIAGNOSIS — E119 Type 2 diabetes mellitus without complications: Secondary | ICD-10-CM

## 2011-05-10 DIAGNOSIS — B3731 Acute candidiasis of vulva and vagina: Secondary | ICD-10-CM | POA: Insufficient documentation

## 2011-05-10 DIAGNOSIS — B373 Candidiasis of vulva and vagina: Secondary | ICD-10-CM

## 2011-05-10 LAB — POCT WET PREP (WET MOUNT)
Clue Cells Wet Prep HPF POC: NEGATIVE
Trichomonas Wet Prep HPF POC: NEGATIVE

## 2011-05-10 LAB — TSH: TSH: 1.062 u[IU]/mL (ref 0.350–4.500)

## 2011-05-10 LAB — POCT GLYCOSYLATED HEMOGLOBIN (HGB A1C): Hemoglobin A1C: 6.3

## 2011-05-10 NOTE — Patient Instructions (Signed)
Monostat cream 7 days, use twice today, and put cream on the outside

## 2011-05-10 NOTE — Progress Notes (Signed)
Subjective:    Patient ID: Grace Ferrell, female    DOB: 01-04-1966, 45 y.o.   MRN: [id number]  HPI 45 year old woman presents with cramping pelvic pain X 4 days and some new white discharge.  Discharge appears thin and white, without odor- pt does not think it looks like cheesy/curdy. No vaginal bleeding or spotting.  Pain is only in pelvic region, does not radiate.  It is a constant pain that decreases in intensity with ibuprofen (taking no more than 1600mg /day).  Pain is aggravated by prolonged sitting (more than 1-2hours), and improves when standing or supine.  Pain woke her from sleep on Tuesday night.  Denies vaginal or vulvar itching, burning, rash/lesion.  No dysuria, hematuria, frequency, hesitancy, urgency, no pelvic cramping, or incontinence.  No n/v, diarrhea or constipation, no change in bowel habits or character of stool.   No fevers, chills, unintentional weight changes, or profound fatigue, and no change in her baseline night sweats.  No change in her normal headaches (at least one/week, relieved with NSAID). She feels cold all the time, rarely uses air conditioning during summer, but no rigors.  Never had pain like this before.  No intercourse for over 1 year.  PMH is significant for T2DM currently managed with metformin and amaryl. Also, pt is s/p Novasure endometrial ablation in 2006 for uterine fibroids and menorrhagia (20-day long menstrual cycles before ablation).  Since ablation, pt has been amenorrheic.        Review of Systems  Constitutional:       Per HPI  Gastrointestinal: Negative for nausea, vomiting, abdominal pain, diarrhea, constipation, blood in stool, abdominal distention, anal bleeding and rectal pain.  Genitourinary: Positive for vaginal discharge, vaginal pain and pelvic pain. Negative for dysuria, urgency, frequency, hematuria, flank pain, decreased urine volume, vaginal bleeding, enuresis, difficulty urinating and genital sores.  Musculoskeletal: Negative for  myalgias and arthralgias.  Skin: Negative for color change, rash and wound.  Hematological: Negative for adenopathy.       Objective:   Physical Exam  Constitutional: She is oriented to person, place, and time. She appears well-developed and well-nourished. She appears distressed.       Pt in pain with position change  Abdominal: Soft. Bowel sounds are normal. She exhibits no distension and no mass. There is no tenderness. There is no guarding.  Genitourinary: There is no rash, tenderness, lesion or injury on the right labia. There is no rash, tenderness, lesion or injury on the left labia. Uterus is tender. Cervix exhibits motion tenderness. There is erythema and tenderness around the vagina. No bleeding around the vagina. No foreign body around the vagina. No signs of injury around the vagina. Vaginal discharge found.       Vaginal erythema.  Unable to visualize cervix- suspect pt has weak vaginal wall, due to fullness around speculum.  Thin white drainage noted on exam with speculum.  Only after bimanual exam, copious thick, curd-like white discharge was observed on gloves and was then draining from vagina.    Pt expierenced pain during speculum insertion as well as significant pain during entire bimanual exam. On bimanual, vaginal wall was smooth, no lesions or masses.  Superior vaginal wall weakened.    Lymphadenopathy:       Right: No inguinal adenopathy present.       Left: No inguinal adenopathy present.  Neurological: She is alert and oriented to person, place, and time.  Skin: Skin is warm, dry and intact. She is not  diaphoretic.  Psychiatric:       Pt was very emotional.  She voiced she was scared, saying "I can't get sick."  Provided emotional support and disucssed suspect her discomfort is due to a yeast infection, which is treatable.          Assessment & Plan:

## 2011-05-10 NOTE — Assessment & Plan Note (Signed)
1. Acute vaginal yeast infection: Thick, copious, cheese curd-like discharge that was found on bi-manual exam, along with pain and tenderness suggestive of vaginal candida.  A. Discussed with patient that we suspect this is a yeast infection, and instructed pt to purchase Monistat 7 vaginal suppository kit from a pharmacy and follow package instructions.  B. Wet mount did not reveal yeast, but this is likely due to inability to access the thick cheesy discharge during speculum exam- pt's pain limited the examiner's ability move the speculum.  C. Pt instructed to call back if symptoms do not improve, or get worse.

## 2011-05-13 ENCOUNTER — Encounter: Payer: Self-pay | Admitting: Family Medicine

## 2011-05-21 ENCOUNTER — Ambulatory Visit (INDEPENDENT_AMBULATORY_CARE_PROVIDER_SITE_OTHER): Payer: Self-pay | Admitting: Family Medicine

## 2011-05-21 ENCOUNTER — Encounter: Payer: Self-pay | Admitting: Family Medicine

## 2011-05-21 VITALS — BP 141/81 | HR 82 | Temp 98.2°F | Wt 169.0 lb

## 2011-05-21 DIAGNOSIS — R300 Dysuria: Secondary | ICD-10-CM

## 2011-05-21 DIAGNOSIS — F329 Major depressive disorder, single episode, unspecified: Secondary | ICD-10-CM

## 2011-05-21 DIAGNOSIS — G894 Chronic pain syndrome: Secondary | ICD-10-CM

## 2011-05-21 DIAGNOSIS — E119 Type 2 diabetes mellitus without complications: Secondary | ICD-10-CM

## 2011-05-21 LAB — POCT URINALYSIS DIPSTICK
Blood, UA: NEGATIVE
Glucose, UA: NEGATIVE
Nitrite, UA: NEGATIVE
Protein, UA: NEGATIVE
Spec Grav, UA: 1.015
Urobilinogen, UA: 0.2

## 2011-05-21 MED ORDER — TRAMADOL HCL 50 MG PO TABS: ORAL_TABLET | ORAL | Status: DC

## 2011-05-21 NOTE — Progress Notes (Signed)
  Subjective:    Patient ID: Grace Ferrell, female    DOB: 08-21-1966, 45 y.o.   MRN: [id number]  HPI 45 year old woman presents after recent visit 11 days prior, when she was suspected to have vaginal candida, which she treated with monistat 7.  After monistat 7, her symptoms of pelvic pain and vaginal discharge were relieved for approx 2-3 days. Today, she reports "it feels like something is falling out of me [vagina]"- states this is not new, but is bothersome feeling-- she feels better when supine.  Also reports new vulvar itching, which is very mild and not bothering her much at all.    Beginning Grace night into Monday, pt reports new back pain in right lower back.  Pain is constant, 8/10.  She suspects she has a UTI.  However, she denies dysuria, hematuria, urgency, frequency, small volume/void, bladder spasm, and she feels like she empties her bladder with each void.  Urine is yellow and clear.  Denies fever, chills, night sweats, change in appetite or intake.  Pt drinks plenty of water daily (over 8 glasses), and 3 cups of coffee daily.  She does not hold her urine, but she only voids 3-4 times daily.  No sexual activity for over 1 year.  Also new: paresthesia/dysesthesia over lateral aspect of right upper leg.  Pt worried about perfusion issues.  Denies change in color, size, or temperature of extremity.  She has had this sensation in this location before- first time was about 3 years ago, and last episode was in April.  Denies recent injury or trauma.  Denies any pain or weakness associated with sensation change.  It is unrelated to position, and is intermittent.      Review of Systems  Constitutional: Negative for fever, chills, diaphoresis, activity change, appetite change and fatigue.  Cardiovascular: Negative for leg swelling.  Gastrointestinal: Negative for nausea, vomiting, abdominal pain, diarrhea, constipation and abdominal distention.  Genitourinary: Positive for flank pain and  vaginal discharge. Negative for dysuria, urgency, frequency, hematuria, decreased urine volume, vaginal bleeding, enuresis, difficulty urinating, genital sores, vaginal pain, menstrual problem and pelvic pain.       Thin white smooth vaginal discharge, no odor.    Right flank/back pain per HPI  Musculoskeletal: Positive for back pain. Negative for myalgias, joint swelling, arthralgias and gait problem.       Right back/flank pain per HPI   Skin: Negative for color change, pallor, rash and wound.  Neurological: Positive for numbness. Negative for syncope and weakness.       Numbness/paresthesia per HPI  Hematological: Negative for adenopathy. Does not bruise/bleed easily.       Objective:   Physical Exam  Vitals reviewed. Constitutional: She appears well-developed and well-nourished. She appears distressed.       Pt visibly uncomfortable  HENT:  Head: Normocephalic and atraumatic.  Abdominal: There is CVA tenderness.       Right side more painful than left- CVA tenderness +  Musculoskeletal:       Legs:      Negative straight leg raise test bilat.  Monofilament with decreased sensation along middle portion of lateral aspect of right thigh; increased sensation to monofilament posteriolateral portion of right thigh          Assessment & Plan:

## 2011-05-21 NOTE — Patient Instructions (Signed)
Take the tramadol one in the morning and two in the evening after you are home Do your exercises Return to see me in 1-2 months

## 2011-05-22 NOTE — Assessment & Plan Note (Signed)
This is her biggest barrier, always self discontinues her antidepressants.

## 2011-05-22 NOTE — Assessment & Plan Note (Signed)
Patient has always had anxiety and pain.  She is in poor physical condition.  Could not find anything on exam.  Will try tramadol, gave back exercises, and would like her to make regular apts so I can help her.  She feels like her uterus is coming out, she had a pelvic 2 months ago with no signs of prolapse, although she was lying down.  Gave information on [place] exercises.

## 2011-05-22 NOTE — Assessment & Plan Note (Signed)
At goal.  Not taking statin, brought this up as a risk, she will think about it.

## 2011-07-29 LAB — DIFFERENTIAL
Basophils Relative: 0
Eosinophils Absolute: 0.4
Eosinophils Relative: 3
Lymphocytes Relative: 45
Lymphs Abs: 2
Lymphs Abs: 4
Monocytes Relative: 6
Neutrophils Relative %: 58

## 2011-07-29 LAB — URINALYSIS, ROUTINE W REFLEX MICROSCOPIC
Glucose, UA: NEGATIVE
Protein, ur: NEGATIVE
Specific Gravity, Urine: 1.027
Urobilinogen, UA: 0.2

## 2011-07-29 LAB — COMPREHENSIVE METABOLIC PANEL
ALT: 12
ALT: 9
AST: 14
Albumin: 4
BUN: 5 — ABNORMAL LOW
CO2: 22
CO2: 24
Calcium: 10.4
Calcium: 9.8
Creatinine, Ser: 0.85
GFR calc Af Amer: >60
GFR calc non Af Amer: >60
GFR calc non Af Amer: >60
Glucose, Bld: 261 — ABNORMAL HIGH
Sodium: 135
Sodium: 140
Total Protein: 7.2

## 2011-07-29 LAB — BASIC METABOLIC PANEL
BUN: 2 — ABNORMAL LOW
BUN: 4 — ABNORMAL LOW
CO2: 29
Calcium: 8.9
Chloride: 98
Chloride: 99
Chloride: 99
Creatinine, Ser: 0.69
Creatinine, Ser: 0.69
GFR calc Af Amer: >60
Glucose, Bld: 230 — ABNORMAL HIGH
Potassium: 3.8

## 2011-07-29 LAB — CBC
HCT: 31.3 — ABNORMAL LOW
HCT: 31.5 — ABNORMAL LOW
Hemoglobin: 10.2 — ABNORMAL LOW
MCHC: 32.5
MCHC: 32.8
MCV: 81.1
MCV: 81.9
MCV: 82.3
MCV: 82.3
Platelets: 262
Platelets: 297
Platelets: 409 — ABNORMAL HIGH
RBC: 3.8 — ABNORMAL LOW
RDW: 15
RDW: 15.4
WBC: 10.3
WBC: 11.3 — ABNORMAL HIGH

## 2011-07-29 LAB — POCT CARDIAC MARKERS: CKMB, poc: 1.2

## 2011-07-29 LAB — URINE CULTURE

## 2011-07-29 LAB — BODY FLUID CULTURE

## 2011-07-29 LAB — D-DIMER, QUANTITATIVE: D-Dimer, Quant: 1.6 — ABNORMAL HIGH

## 2011-07-29 LAB — PROTIME-INR: Prothrombin Time: 13.7

## 2011-07-29 LAB — LIPASE, BLOOD: Lipase: 14

## 2011-07-29 LAB — AFB CULTURE WITH SMEAR (NOT AT ARMC): Acid Fast Smear: NONE SEEN

## 2011-07-29 LAB — ANAEROBIC CULTURE

## 2011-07-29 LAB — GRAM STAIN

## 2011-10-12 ENCOUNTER — Other Ambulatory Visit: Payer: Self-pay | Admitting: Family Medicine

## 2011-10-12 DIAGNOSIS — I10 Essential (primary) hypertension: Secondary | ICD-10-CM

## 2011-10-18 ENCOUNTER — Other Ambulatory Visit: Payer: Self-pay | Admitting: Family Medicine

## 2011-10-18 NOTE — Telephone Encounter (Signed)
Refill request

## 2011-12-14 ENCOUNTER — Other Ambulatory Visit: Payer: Self-pay | Admitting: Family Medicine

## 2011-12-15 NOTE — Telephone Encounter (Signed)
Refill request

## 2011-12-16 ENCOUNTER — Other Ambulatory Visit: Payer: Self-pay | Admitting: Family Medicine

## 2011-12-16 MED ORDER — GLIMEPIRIDE 4 MG PO TABS: 4.0000 mg | ORAL_TABLET | Freq: Every day | ORAL | Status: DC

## 2011-12-16 MED ORDER — METFORMIN HCL 1000 MG PO TABS: 1000.0000 mg | ORAL_TABLET | Freq: Two times a day (BID) | ORAL | Status: DC

## 2011-12-16 NOTE — Telephone Encounter (Signed)
Refilled metformin and glimepiride for one month as patient needs to return to clinic for future refills.

## 2012-01-21 ENCOUNTER — Other Ambulatory Visit: Payer: Self-pay | Admitting: Family Medicine

## 2012-01-21 NOTE — Telephone Encounter (Signed)
Sent refill for metformin, but will not refill again until patient is seen again in clinic. Her last visit was in July 2012.

## 2012-02-06 ENCOUNTER — Emergency Department (HOSPITAL_COMMUNITY)
Admission: EM | Admit: 2012-02-06 | Discharge: 2012-02-06 | Disposition: A | Discharge status: Home / Self Care | Payer: Self-pay | Attending: Emergency Medicine | Admitting: Emergency Medicine

## 2012-02-06 ENCOUNTER — Emergency Department (HOSPITAL_COMMUNITY): Payer: Self-pay

## 2012-02-06 ENCOUNTER — Encounter (HOSPITAL_COMMUNITY): Payer: Self-pay | Admitting: *Deleted

## 2012-02-06 DIAGNOSIS — M25569 Pain in unspecified knee: Secondary | ICD-10-CM | POA: Insufficient documentation

## 2012-02-06 DIAGNOSIS — Z7982 Long term (current) use of aspirin: Secondary | ICD-10-CM | POA: Insufficient documentation

## 2012-02-06 DIAGNOSIS — E119 Type 2 diabetes mellitus without complications: Secondary | ICD-10-CM | POA: Insufficient documentation

## 2012-02-06 DIAGNOSIS — S8992XA Unspecified injury of left lower leg, initial encounter: Secondary | ICD-10-CM

## 2012-02-06 DIAGNOSIS — M25469 Effusion, unspecified knee: Secondary | ICD-10-CM | POA: Insufficient documentation

## 2012-02-06 DIAGNOSIS — Z79899 Other long term (current) drug therapy: Secondary | ICD-10-CM | POA: Insufficient documentation

## 2012-02-06 DIAGNOSIS — E7800 Pure hypercholesterolemia, unspecified: Secondary | ICD-10-CM | POA: Insufficient documentation

## 2012-02-06 DIAGNOSIS — S99929A Unspecified injury of unspecified foot, initial encounter: Secondary | ICD-10-CM | POA: Insufficient documentation

## 2012-02-06 DIAGNOSIS — I10 Essential (primary) hypertension: Secondary | ICD-10-CM | POA: Insufficient documentation

## 2012-02-06 DIAGNOSIS — S8990XA Unspecified injury of unspecified lower leg, initial encounter: Secondary | ICD-10-CM | POA: Insufficient documentation

## 2012-02-06 DIAGNOSIS — W108XXA Fall (on) (from) other stairs and steps, initial encounter: Secondary | ICD-10-CM | POA: Insufficient documentation

## 2012-02-06 HISTORY — DX: Essential (primary) hypertension: I10

## 2012-02-06 HISTORY — DX: Pure hypercholesterolemia, unspecified: E78.00

## 2012-02-06 MED ORDER — OXYCODONE-ACETAMINOPHEN 5-325 MG PO TABS: 1.0000 | ORAL_TABLET | ORAL | Status: AC | PRN

## 2012-02-06 MED ORDER — OXYCODONE-ACETAMINOPHEN 5-325 MG PO TABS
1.0000 | ORAL_TABLET | Freq: Once | ORAL | Status: AC
  Administered 2012-02-06: 1 via ORAL
  Filled 2012-02-06: qty 1
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
## 2012-02-06 NOTE — Discharge Instructions (Signed)
Knee Effusion The medical term for having fluid in your knee is effusion. This is often due to an internal derangement of the knee. This means something is wrong inside the knee. Some of the causes of fluid in the knee may be torn cartilage, a torn ligament, or bleeding into the joint from an injury. Your knee is likely more difficult to bend and move. This is often because there is increased pain and pressure in the joint. The time it takes for recovery from a knee effusion depends on different factors, including:   Type of injury.   Your age.   Physical and medical conditions.   Rehabilitation Strategies.  How long you will be away from your normal activities will depend on what kind of knee problem you have and how much damage is present. Your knee has two types of cartilage. Articular cartilage covers the bone ends and lets your knee bend and move smoothly. Two menisci, thick pads of cartilage that form a rim inside the joint, help absorb shock and stabilize your knee. Ligaments bind the bones together and support your knee joint. Muscles move the joint, help support your knee, and take stress off the joint itself. CAUSES  Often an effusion in the knee is caused by an injury to one of the menisci. This is often a tear in the cartilage. Recovery after a meniscus injury depends on how much meniscus is damaged and whether you have damaged other knee tissue. Small tears may heal on their own with conservative treatment. Conservative means rest, limited weight bearing activity and muscle strengthening exercises. Your recovery may take up to 6 weeks.  TREATMENT  Larger tears may require surgery. Meniscus injuries may be treated during arthroscopy. Arthroscopy is a procedure in which your surgeon uses a small telescope like instrument to look in your knee. Your caregiver can make a more accurate diagnosis (learning what is wrong) by performing an arthroscopic procedure. If your injury is on the inner  margin of the meniscus, your surgeon may trim the meniscus back to a smooth rim. In other cases your surgeon will try to repair a damaged meniscus with stitches (sutures). This may make rehabilitation take longer, but may provide better long term result by helping your knee keep its shock absorption capabilities. Ligaments which are completely torn usually require surgery for repair. HOME CARE INSTRUCTIONS  Use crutches as instructed.   If a brace is applied, use as directed.   Once you are home, an ice pack applied to your swollen knee may help with discomfort and help decrease swelling.   Keep your knee raised (elevated) when you are not up and around or on crutches.   Only take over-the-counter or prescription medicines for pain, discomfort, or fever as directed by your caregiver.   Your caregivers will help with instructions for rehabilitation of your knee. This often includes strengthening exercises.   You may resume a normal diet and activities as directed.  SEEK MEDICAL CARE IF:   There is increased swelling in your knee.   You notice redness, swelling, or increasing pain in your knee.   An unexplained oral temperature above 102 F (38.9 C) develops.  SEEK IMMEDIATE MEDICAL CARE IF:   You develop a rash.   You have difficulty breathing.   You have any allergic reactions from medications you may have been given.   There is severe pain with any motion of the knee.  MAKE SURE YOU:   Understand these instructions.     Will watch your condition.   Will get help right away if you are not doing well or get worse.  Document Released: 12/28/2003 Document Revised: 09/26/2011 Document Reviewed: 03/02/2008 [employer] Patient Information 2012 [employer].Knee Wraps (Elastic Bandage) and RICE Knee wraps come in many different shapes and sizes and perform many different functions. Some wraps may provide cold therapy or warmth. Your caregiver will help you to determine what is best  for your protection, or recovery following your injury. The following are some general tips to help you use a knee wrap:  Use the wrap as directed.   Do not keep the wrap so tight that it cuts off the circulation of the leg below the wrap.   If your lower leg becomes blue, loses feeling, or becomes swollen below the wrap, it is probably too tight. Loosen the wrap as needed to improve these problems.   See your caregiver or trainer if the wrap seems to be making your problems worse rather than better.  Wraps in general help to remind you that you have an injury. They provide limited support. The few pounds of support they provide are minimal considering the hundreds of pounds of pressure it takes to injure a joint or tear ligaments.  The routine care of many injuries includes Rest, Ice, Compression, and Elevation (RICE).  Rest is required to allow your body to heal. Generally following bumps and bruises, routine activities can be resumed when comfortable. Injured tendons (cord-like structures that attach muscle to bone) and bones take approximately 6 to 12 weeks to heal.   Ice following an injury helps keep the swelling down and reduces pain. Do not apply ice directly to skin. Apply ice bags for 20-30 minutes every 3-4 hours for the first 2-3 days following injury or surgery. Place ice in a plastic bag with a towel around it.   Compression helps keep swelling down, gives support, and helps with discomfort. If a knee wrap has been applied, it should be removed and reapplied every 3 to 4 hours. It should be applied firmly enough to keep swelling down, but not too tightly. Watch your lower leg and toes for swelling, bluish discoloration, coldness, numbness or excessive pain. If any of these symptoms (problems) occur, remove the knee wrap and reapply more loosely. If these symptoms persist, contact your caregiver immediately.   Elevation helps reduce swelling, and decreases pain. With extremities  (arms/hands and legs/feet), the injured area should be placed near to or above the level of the heart if possible.  Persistent pain and inability to use the injured area for more than 2 to 3 days are warning signs indicating that you should see a caregiver for a follow-up visit as soon as possible. Initially, a hairline fracture (this is the same as a broken bone) may not be seen on x-rays.  Persistent pain and swelling mean limitedweight bearing (use of crutches as instructed) should continue. You may need further x-rays.  X-rays may not show a non-displaced fracture until a week or ten days later. Make a follow-up appointment with your caregiver. A radiologist (a specialist in reading x-rays) will re-read your x-rays. Make sure you know how to get your x-ray results. Do not assume everything is normal if you do not hear from your caregiver. MAKE SURE YOU:   Understand these instructions.   Will watch your condition.   Will get help right away if you are not doing well or get worse.  Document Released: 03/29/2002 Document Revised:   09/26/2011 Document Reviewed: 01/26/2009 [employer] Patient Information 2012 [employer]. 
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
## 2012-02-06 NOTE — ED Notes (Signed)
Pt to xray  Pt alert and oriented x4. Respirations even and unlabored, bilateral symmetrical rise and fall of chest. Skin warm and dry. In no acute distress. Denies needs.   

## 2012-02-06 NOTE — ED Provider Notes (Signed)
Medical screening examination/treatment/procedure(s) were performed by non-physician practitioner and as supervising physician I was immediately available for consultation/collaboration.  Baldemar Dady R. Nathian Stencil, MD 02/06/12 1526 

## 2012-03-10 ENCOUNTER — Ambulatory Visit (INDEPENDENT_AMBULATORY_CARE_PROVIDER_SITE_OTHER): Payer: Self-pay | Admitting: Family Medicine

## 2012-03-10 ENCOUNTER — Encounter: Payer: Self-pay | Admitting: Family Medicine

## 2012-03-10 VITALS — BP 110/75 | HR 69 | Ht 62.0 in | Wt 166.0 lb

## 2012-03-10 DIAGNOSIS — E119 Type 2 diabetes mellitus without complications: Secondary | ICD-10-CM

## 2012-03-10 DIAGNOSIS — E785 Hyperlipidemia, unspecified: Secondary | ICD-10-CM

## 2012-03-10 DIAGNOSIS — G894 Chronic pain syndrome: Secondary | ICD-10-CM

## 2012-03-10 DIAGNOSIS — I10 Essential (primary) hypertension: Secondary | ICD-10-CM

## 2012-03-10 LAB — COMPREHENSIVE METABOLIC PANEL
Albumin: 4.6 g/dL (ref 3.5–5.2)
CO2: 27 mEq/L (ref 19–32)
Glucose, Bld: 94 mg/dL (ref 70–99)
Potassium: 3.7 mEq/L (ref 3.5–5.3)
Sodium: 141 mEq/L (ref 135–145)
Total Protein: 7.2 g/dL (ref 6.0–8.3)

## 2012-03-10 LAB — CBC
Platelets: 312 10*3/uL (ref 150–400)
RBC: 5.04 MIL/uL (ref 3.87–5.11)
WBC: 9.2 10*3/uL (ref 4.0–10.5)

## 2012-03-10 MED ORDER — TRAMADOL HCL 50 MG PO TABS: ORAL_TABLET | ORAL | Status: DC

## 2012-03-10 NOTE — Patient Instructions (Signed)
Diabetes and Exercise Regular exercise is important and can help:   Control blood glucose (sugar).   Decrease blood pressure.    Control blood lipids (cholesterol, triglycerides).   Improve overall health.  BENEFITS FROM EXERCISE  Improved fitness.   Improved flexibility.   Improved endurance.   Increased bone density.   Weight control.   Increased muscle strength.   Decreased body fat.   Improvement of the body's use of insulin, a hormone.   Increased insulin sensitivity.   Reduction of insulin needs.   Reduced stress and tension.   Helps you feel better.  People with diabetes who add exercise to their lifestyle gain additional benefits, including:  Weight loss.   Reduced appetite.   Improvement of the body's use of blood glucose.   Decreased risk factors for heart disease:   Lowering of cholesterol and triglycerides.   Raising the level of good cholesterol (high-density lipoproteins, HDL).   Lowering blood sugar.   Decreased blood pressure.  TYPE 1 DIABETES AND EXERCISE  Exercise will usually lower your blood glucose.   If blood glucose is greater than 240 mg/dl, check urine ketones. If ketones are present, do not exercise.   Location of the insulin injection sites may need to be adjusted with exercise. Avoid injecting insulin into areas of the body that will be exercised. For example, avoid injecting insulin into:   The arms when playing tennis.   The legs when jogging. For more information, discuss this with your caregiver.   Keep a record of:   Food intake.   Type and amount of exercise.   Expected peak times of insulin action.   Blood glucose levels.  Do this before, during, and after exercise. Review your records with your caregiver. This will help you to develop guidelines for adjusting food intake and insulin amounts.  TYPE 2 DIABETES AND EXERCISE  Regular physical activity can help control blood glucose.   Exercise is important  because it may:   Increase the body's sensitivity to insulin.   Improve blood glucose control.   Exercise reduces the risk of heart disease. It decreases serum cholesterol and triglycerides. It also lowers blood pressure.   Those who take insulin or oral hypoglycemic agents should watch for signs of hypoglycemia. These signs include dizziness, shaking, sweating, chills, and confusion.   Body water is lost during exercise. It must be replaced. This will help to avoid loss of body fluids (dehydration) or heat stroke.  Be sure to talk to your caregiver before starting an exercise program to make sure it is safe for you. Remember, any activity is better than none.  Document Released: 12/28/2003 Document Revised: 09/26/2011 Document Reviewed: 04/13/2009 ExitCare Patient Information 2012 [employer].Hypertension As your heart beats, it forces blood through your arteries. This force is your blood pressure. If the pressure is too high, it is called hypertension (HTN) or high blood pressure. HTN is dangerous because you may have it and not know it. High blood pressure may mean that your heart has to work harder to pump blood. Your arteries may be narrow or stiff. The extra work puts you at risk for heart disease, stroke, and other problems.  Blood pressure consists of two numbers, a higher number over a lower, 110/72, for example. It is stated as "110 over 72." The ideal is below 120 for the top number (systolic) and under 80 for the bottom (diastolic). Write down your blood pressure today. You should pay close attention to your blood   pressure if you have certain conditions such as:  Heart failure.   Prior heart attack.   Diabetes   Chronic kidney disease.   Prior stroke.   Multiple risk factors for heart disease.  To see if you have HTN, your blood pressure should be measured while you are seated with your arm held at the level of the heart. It should be measured at least twice. A one-time  elevated blood pressure reading (especially in the Emergency Department) does not mean that you need treatment. There may be conditions in which the blood pressure is different between your right and left arms. It is important to see your caregiver soon for a recheck. Most people have essential hypertension which means that there is not a specific cause. This type of high blood pressure may be lowered by changing lifestyle factors such as:  Stress.   Smoking.   Lack of exercise.   Excessive weight.   Drug/tobacco/alcohol use.   Eating less salt.  Most people do not have symptoms from high blood pressure until it has caused damage to the body. Effective treatment can often prevent, delay or reduce that damage. TREATMENT  When a cause has been identified, treatment for high blood pressure is directed at the cause. There are a large number of medications to treat HTN. These fall into several categories, and your caregiver will help you select the medicines that are best for you. Medications may have side effects. You should review side effects with your caregiver. If your blood pressure stays high after you have made lifestyle changes or started on medicines,   Your medication(s) may need to be changed.   Other problems may need to be addressed.   Be certain you understand your prescriptions, and know how and when to take your medicine.   Be sure to follow up with your caregiver within the time frame advised (usually within two weeks) to have your blood pressure rechecked and to review your medications.   If you are taking more than one medicine to lower your blood pressure, make sure you know how and at what times they should be taken. Taking two medicines at the same time can result in blood pressure that is too low.  SEEK IMMEDIATE MEDICAL CARE IF:  You develop a severe headache, blurred or changing vision, or confusion.   You have unusual weakness or numbness, or a faint feeling.    You have severe chest or abdominal pain, vomiting, or breathing problems.  MAKE SURE YOU:   Understand these instructions.   Will watch your condition.   Will get help right away if you are not doing well or get worse.  Document Released: 10/07/2005 Document Revised: 09/26/2011 Document Reviewed: 05/27/2008 ExitCare Patient Information 2012 [employer]. 

## 2012-03-11 NOTE — Assessment & Plan Note (Signed)
A1C at goal today. Continue current regimen. Stressed importance of follow up. Will check renal function in setting of metformin use.

## 2012-03-11 NOTE — Assessment & Plan Note (Signed)
At goal. Continue current regimen. Will check Cr and K. Stressed importance of close follow up.

## 2012-03-11 NOTE — Assessment & Plan Note (Signed)
Rechecking LDL today. Not on statin.

## 2012-03-11 NOTE — Progress Notes (Signed)
Subjective:  Pt is here for chronic problem follow up in setting of history of HTN and DM. Pt has not been seen in > 1 year.   Diabetes:  Checking CBGs?:yes How Often?:1-2 times per day Blood Sugar Range:120s-140s Polyuria, polydypsia, polyphagia:no Symptomatic Hypoglycemia:no Medication Compliance?:yes ACE if hypertensive/obesity?:yes Statin if LDL >100?:no  Hypertension:  Checking BPS Daily ?:no BP Range:n/a Any HA, CP, SOB?:no Any Medication Side Effects:no Medication Compliance:yes Salt intake?:low per pt Exercise?:no Weight Loss?:no           Review of Systems - Negative except as noted above in HPI  Objective:  Current Outpatient Prescriptions  Medication Sig Dispense Refill  . aspirin 81 MG EC tablet Take 81 mg by mouth daily.        . enalapril-hydrochlorothiazide (VASERETIC) 10-25 MG per tablet TAKE ONE TABLET BY MOUTH DAILY  90 tablet  0  . glimepiride (AMARYL) 4 MG tablet Take 1 tablet (4 mg total) by mouth daily.  30 tablet  0  . metFORMIN (GLUCOPHAGE) 1000 MG tablet TAKE 1 TABLET BY MOUTH TWICE DAILY WITH FOOD  60 tablet  0  . traMADol (ULTRAM) 50 MG tablet One in the AM and two in the PM as needed for pain  90 tablet  2    Wt Readings from Last 3 Encounters:  03/10/12 166 lb (75.297 kg)  05/21/11 169 lb (76.658 kg)  05/10/11 165 lb (74.844 kg)   Temp Readings from Last 3 Encounters:  02/06/12 97.9 F (36.6 C) Oral  05/21/11 98.2 F (36.8 C) Oral  05/10/11 98.2 F (36.8 C) Oral   BP Readings from Last 3 Encounters:  03/10/12 110/75  02/06/12 131/65  05/21/11 141/81   Pulse Readings from Last 3 Encounters:  03/10/12 69  02/06/12 62  05/21/11 82     General: alert and cooperative HEENT: PERRLA and extra ocular movement intact Heart: S1, S2 normal, no murmur, rub or gallop, regular rate and rhythm Lungs: clear to auscultation, no wheezes or rales and unlabored breathing Abdomen: abdomen is soft without significant tenderness, masses,  organomegaly or guarding Extremities: extremities normal, atraumatic, no cyanosis or edema Skin:no rashes Neurology: normal without focal findings   Assessment/Plan:

## 2012-03-12 ENCOUNTER — Telehealth: Payer: Self-pay | Admitting: Family Medicine

## 2012-03-12 ENCOUNTER — Encounter: Payer: Self-pay | Admitting: Family Medicine

## 2012-03-12 ENCOUNTER — Other Ambulatory Visit: Payer: Self-pay | Admitting: Family Medicine

## 2012-03-12 DIAGNOSIS — E785 Hyperlipidemia, unspecified: Secondary | ICD-10-CM

## 2012-03-12 DIAGNOSIS — E1169 Type 2 diabetes mellitus with other specified complication: Secondary | ICD-10-CM | POA: Insufficient documentation

## 2012-03-12 MED ORDER — PRAVASTATIN SODIUM 40 MG PO TABS: 40.0000 mg | ORAL_TABLET | Freq: Every day | ORAL | Status: DC

## 2012-03-12 NOTE — Telephone Encounter (Signed)
Call him a voicemail for patient discussing lab results and LDL. Discussed that I called in a new medication for this. Discussed importance of PCP followup. Letter also sent.

## 2012-03-13 ENCOUNTER — Telehealth: Payer: Self-pay | Admitting: Family Medicine

## 2012-03-13 NOTE — Telephone Encounter (Signed)
Called pharmacy . All RX are there ready for pick up. Message left on patient's voicemail.

## 2012-03-13 NOTE — Telephone Encounter (Signed)
Patient is calling because the Rx's that were sent to her Pharmacy yesterday, some how have not made it there and she would like for them to be resent.

## 2012-03-25 IMAGING — CR DG KNEE COMPLETE 4+V*L*
5 series · 5 of 5 positions shown · non-contrast
Comparison: [DATE].

CLINICAL DATA: Fall with knee pain.

LEFT KNEE - COMPLETE 4+ VIEW

[t knee ap left]
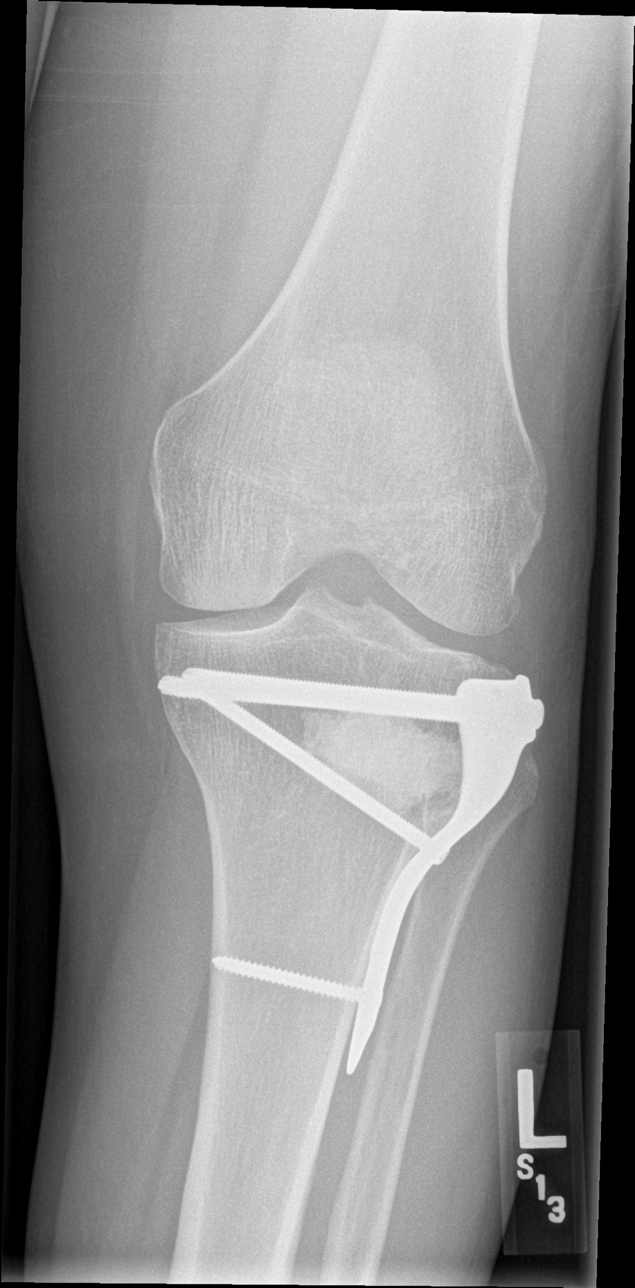

[t knee obl left (1 of 3)]
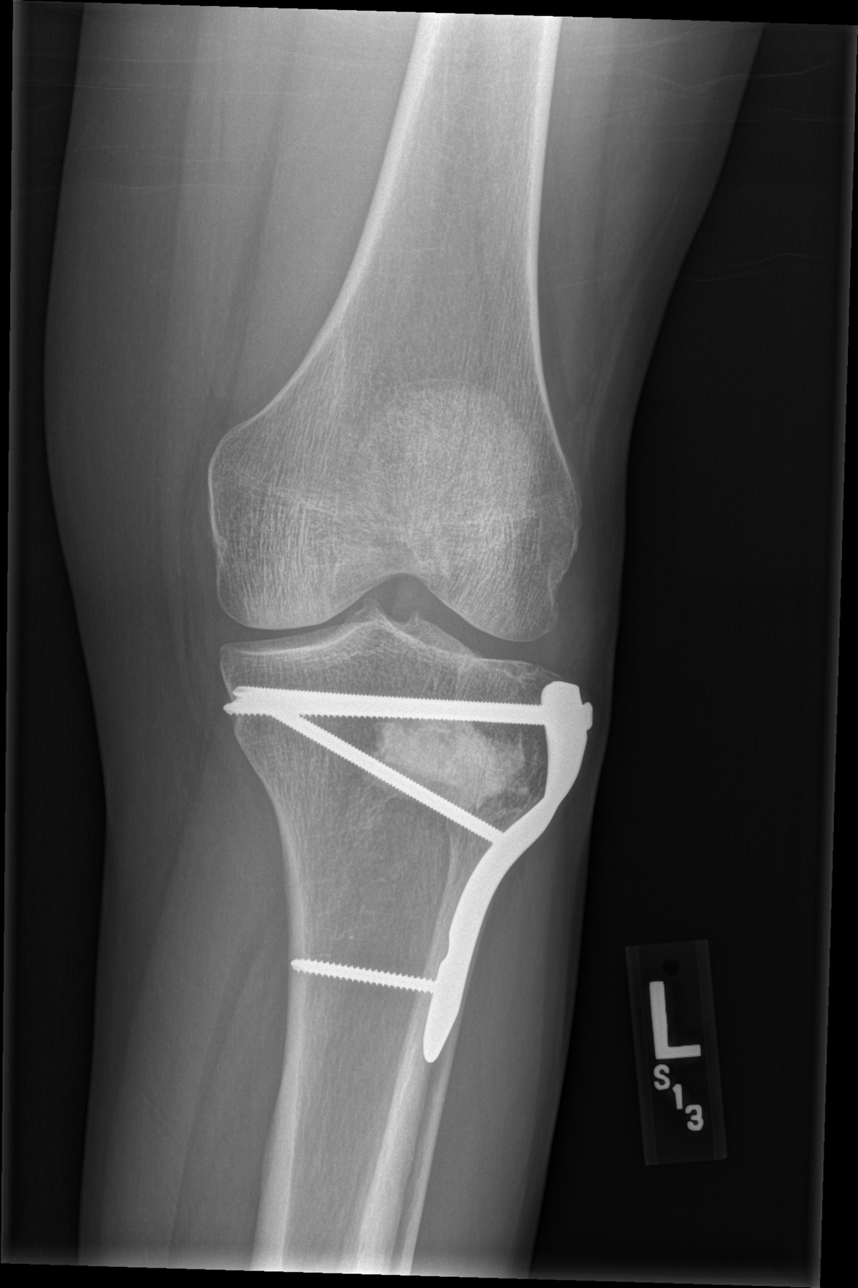

[t knee obl left (2 of 3)]
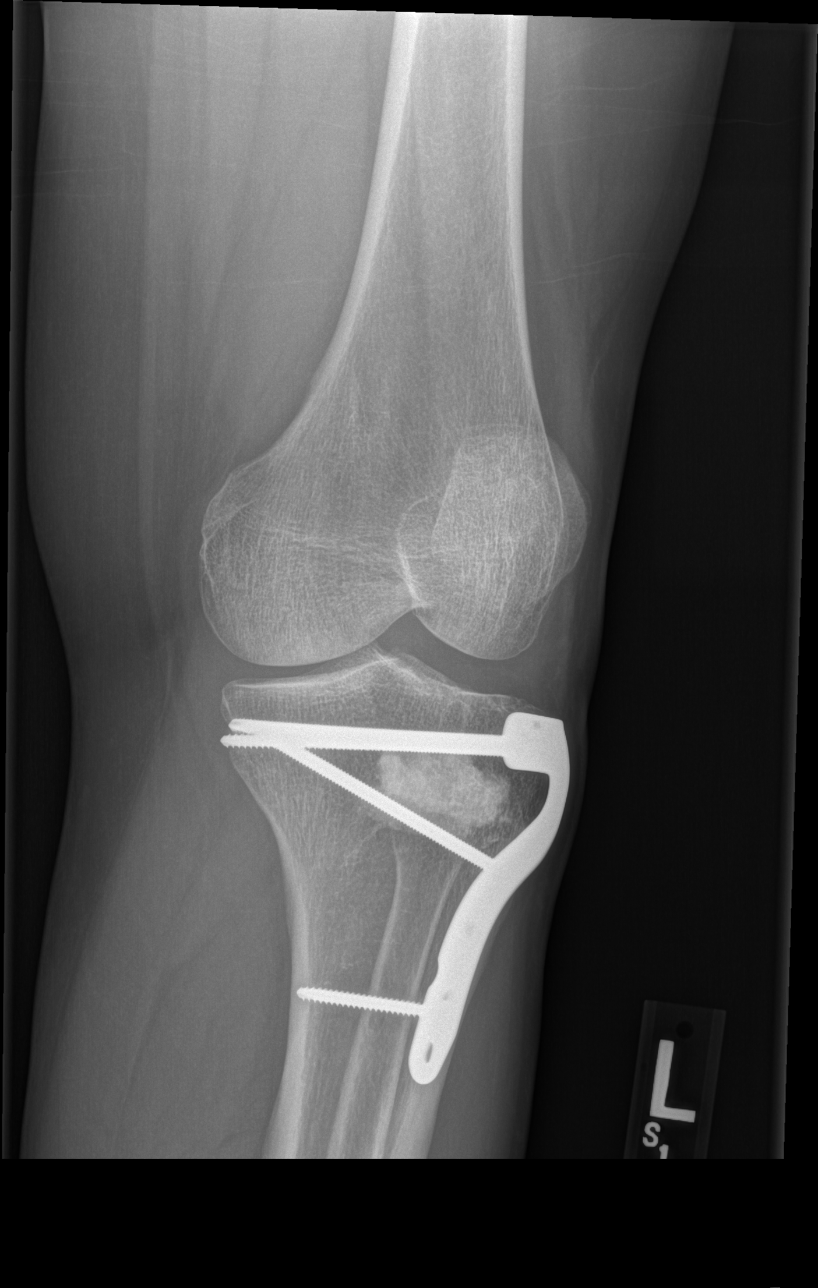

[t knee obl left (3 of 3)]
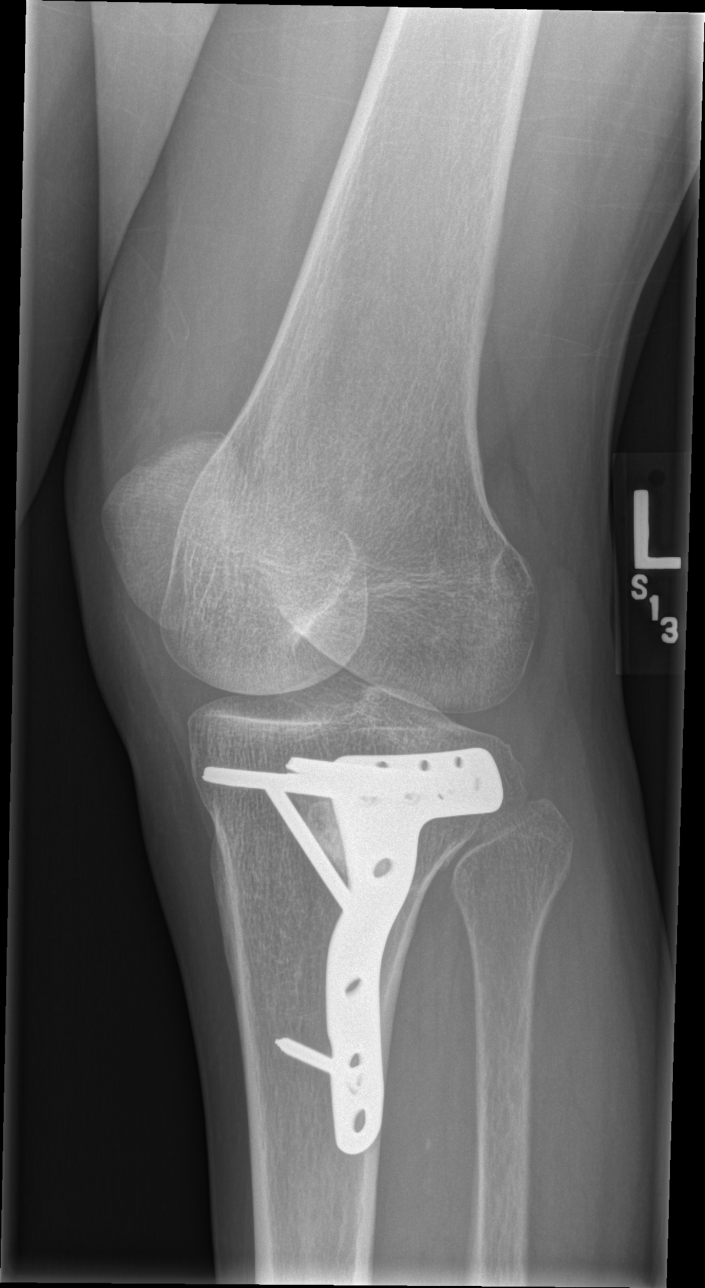

[t knee lat left]
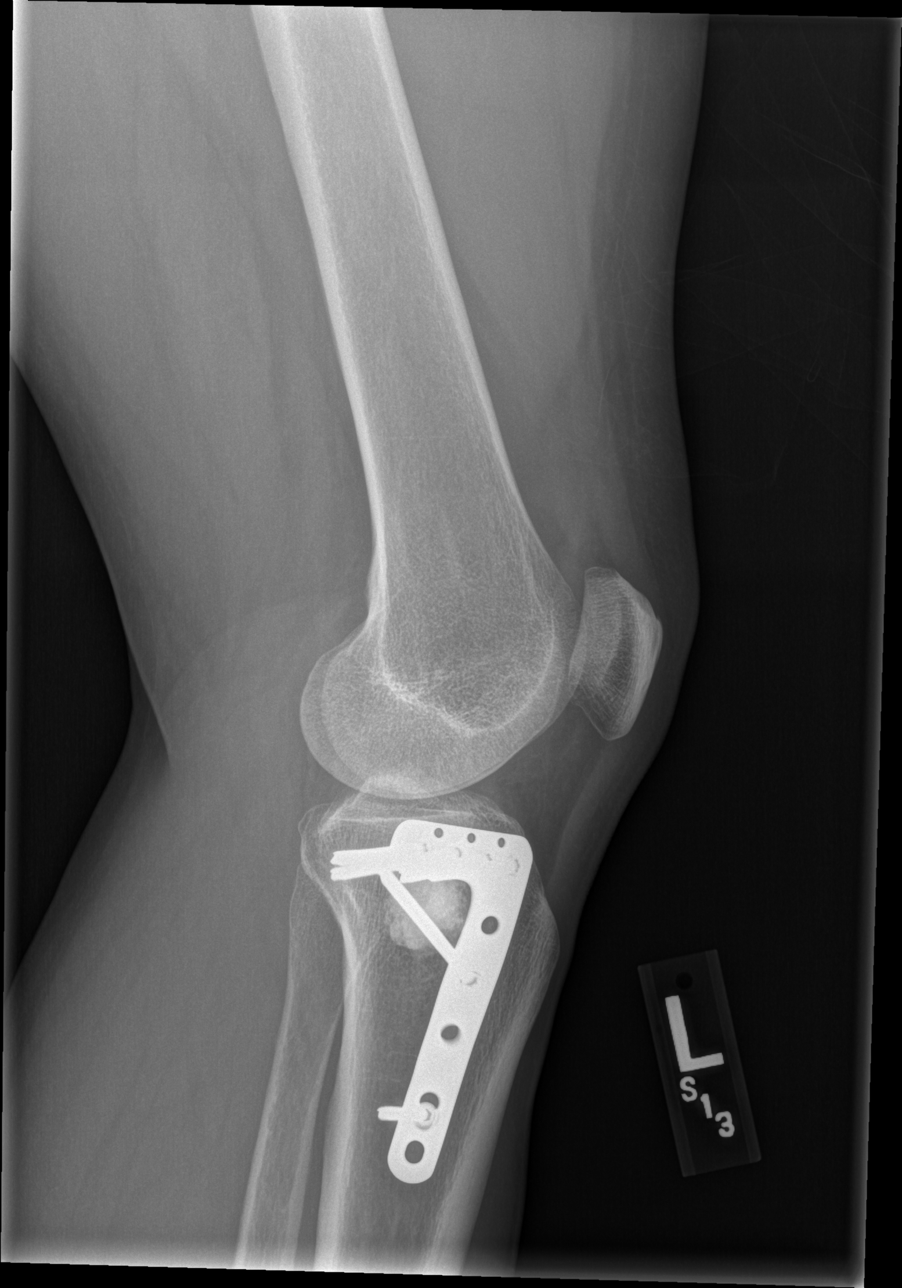

[5 of 5 positions shown; findings below may reference images not displayed]

FINDINGS: No definite joint effusion or fracture.  Lateral plate
and screw fixation of the proximal tibia is seen with the presence
of methylmethacrylate.  No degenerative changes.
IMPRESSION: No acute fracture.

## 2012-04-10 ENCOUNTER — Encounter: Payer: Self-pay | Admitting: Family Medicine

## 2012-04-10 ENCOUNTER — Ambulatory Visit (INDEPENDENT_AMBULATORY_CARE_PROVIDER_SITE_OTHER): Payer: Self-pay | Admitting: Family Medicine

## 2012-04-10 ENCOUNTER — Other Ambulatory Visit (HOSPITAL_COMMUNITY)
Admission: RE | Admit: 2012-04-10 | Discharge: 2012-04-10 | Disposition: A | Discharge status: Home / Self Care | Payer: Self-pay | Source: Ambulatory Visit | Attending: Family Medicine | Admitting: Family Medicine

## 2012-04-10 VITALS — BP 110/70 | HR 80 | Temp 98.1°F | Ht 62.0 in | Wt 168.0 lb

## 2012-04-10 DIAGNOSIS — J329 Chronic sinusitis, unspecified: Secondary | ICD-10-CM

## 2012-04-10 DIAGNOSIS — E785 Hyperlipidemia, unspecified: Secondary | ICD-10-CM

## 2012-04-10 DIAGNOSIS — Z124 Encounter for screening for malignant neoplasm of cervix: Secondary | ICD-10-CM

## 2012-04-10 DIAGNOSIS — Z780 Asymptomatic menopausal state: Secondary | ICD-10-CM

## 2012-04-10 DIAGNOSIS — E119 Type 2 diabetes mellitus without complications: Secondary | ICD-10-CM

## 2012-04-10 DIAGNOSIS — Z01419 Encounter for gynecological examination (general) (routine) without abnormal findings: Secondary | ICD-10-CM | POA: Insufficient documentation

## 2012-04-10 LAB — POCT RAPID STREP A (OFFICE): Rapid Strep A Screen: NEGATIVE

## 2012-04-10 MED ORDER — METFORMIN HCL 1000 MG PO TABS: 1000.0000 mg | ORAL_TABLET | Freq: Two times a day (BID) | ORAL | Status: DC

## 2012-04-10 MED ORDER — OXYMETAZOLINE HCL 0.05 % NA SOLN: 3.0000 | Freq: Two times a day (BID) | NASAL | Status: AC

## 2012-04-10 NOTE — Patient Instructions (Addendum)
For the sinus congestion, there doesn't seem to be any evidence of infection. Your blood pressure is doing well, so I would recommend a 3 day course of afrin. Also, to help with the congestion, you can use a nettypot which will help with the congestion.  If you start developing fever, please come back.   For your pap, I will send you a letter with the results.

## 2012-04-11 DIAGNOSIS — Z780 Asymptomatic menopausal state: Secondary | ICD-10-CM | POA: Insufficient documentation

## 2012-04-11 DIAGNOSIS — J329 Chronic sinusitis, unspecified: Secondary | ICD-10-CM | POA: Insufficient documentation

## 2012-04-11 NOTE — Assessment & Plan Note (Addendum)
No evidence of abscess or bacterial infection. Afrin for 3 days for decongestion. Also neti pott with rec to boil and cool water prior to use. Continue symptomatic treatment with otc decongestants.  Patient to return if no improvement or if worsening of symptoms.

## 2012-04-11 NOTE — Progress Notes (Signed)
Patient ID: Grace Ferrell, female   DOB: Mar 02, 1966, 46 y.o.   MRN: [id number] Patient ID: Grace Ferrell    DOB: 26-Jul-1966, 46 y.o.   MRN: [id number] --- Subjective:  Grace Ferrell is a 46 y.o.female with h/o type 2 diabetes, hypertension, HLD, chronic sinusitis who presents for PAP smear and complaint of sinus headache. Sinus congestion: Frontal headache started 1 week ago. Left periorbital. No sinus drainage. No fever. Feels very congested. No nausea or vomiting. Headache is pressure like. No change in vision. Has a dry, non productive cough. Has been using advil sinus and tylenol sinus along with sinus allergy medicine. Has not been using antihistamines. Has tried nasonex and flonase in the past, but felt like they made her "high". She has been using saline spray without much relief.  Was seen by ENT 2 years ago for these headaches and was found to have a deviated septum and scarred sinuses from chronic infection. She was recommended to have surgery with draining tubes but she refused due to lack of insurance.   Well woman exam:  LMP: February 2006, had ablation of the uterine lining for fibroids. Does have some hot flashes.  Last intercourse: 2010 No history of STD's. History of abnormal pap over 20 years ago. Has had normal pap since.  Denies any spotting, any itching, odor or vaginal discharge.   ROS: see HPI Past Medical History: reviewed and updated medications and allergies. Social History: Tobacco:She reports smoking 1/2 pack per day  Objective: Filed Vitals:   04/10/12 1501  BP: 110/70  Pulse: 80  Temp: 98.1 F (36.7 C)    Physical Examination:   General appearance - alert, well appearing, and in no distress Ears - TM's difficult to visualize due to excessive cerumen Nose - erythematous and inflamed nasal turbinates. No drainage.  Head: pain to palpation along frontal sinuses b/l and maxillary sinuses left greater than right.  Mouth - mucous membranes moist, small white exudate on left  tonsil.  Neck - supple, no significant adenopathy Chest - clear to auscultation, no wheezes, rales or rhonchi, symmetric air entry Heart - normal rate, regular rhythm, normal S1, S2, no murmurs, rubs, clicks or gallops Abdomen - soft, nontender, nondistended, no masses or organomegaly Extremities - peripheral pulses normal, no pedal edema, no clubbing or cyanosis Neuro: CN 2-12 grossly intact.  Pelvic: cervix normal in appearance, external genitalia normal, no adnexal masses or tenderness, no cervical motion tenderness, uterus normal size, shape, and consistency and vagina normal without discharge

## 2012-04-11 NOTE — Assessment & Plan Note (Addendum)
PAP obtained. No history of recent sexual intercourse, so did not test for STD's
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
## 2012-04-11 NOTE — Assessment & Plan Note (Signed)
Continue metformin and glimepiride. Will need A1C checked in august.

## 2012-04-14 ENCOUNTER — Encounter: Payer: Self-pay | Admitting: Family Medicine

## 2012-04-16 ENCOUNTER — Telehealth: Payer: Self-pay | Admitting: Family Medicine

## 2012-04-16 NOTE — Telephone Encounter (Signed)
Tried calling patient to touch base about her sinus congestion and bleeding, but did not get an answer or an answering machine. Will try again tomorrow.

## 2012-04-16 NOTE — Telephone Encounter (Signed)
Patient is calling after being seen last Friday for her PAP and about her sinuses.  She now has quite a bit of blood and the nasal spray has not helped and is very uncomfortable.  She doesn't have insurance so she is hoping for a different solution so that she won't have to come in to be seen.

## 2012-06-05 ENCOUNTER — Telehealth: Payer: Self-pay | Admitting: Family Medicine

## 2012-06-05 NOTE — Telephone Encounter (Signed)
Patient wants to speak to Dr. Gwenlyn Saran about possible Thyroid problems.  She doesn't qualify for the [facility], but she can't afford an appt.

## 2012-06-05 NOTE — Telephone Encounter (Signed)
Will fwd. To Dr.Losq. .Capricia Serda  

## 2012-06-16 ENCOUNTER — Telehealth: Payer: Self-pay | Admitting: *Deleted

## 2012-06-16 NOTE — Telephone Encounter (Signed)
Message copied by Arlyss Repress on Tue Jun 16, 2012  1:53 PM ------      Message from: Marena Chancy E      Created: Thu Jun 11, 2012  3:52 PM       Hi Renato Battles,      Can you call [name] to let her know that it is unfortunately not safe for me to evaluate her for thyroid issues over the phone. I will need to examine her and likely get blood work. She will need to come in for an office visit.      Thank you so much!      Judeth Cornfield

## 2012-06-16 NOTE — Telephone Encounter (Signed)
Called pt. She will schedule OV. Grace Ferrell, Grace Ferrell

## 2012-06-23 ENCOUNTER — Other Ambulatory Visit: Payer: Self-pay | Admitting: Family Medicine

## 2012-06-23 MED ORDER — ENALAPRIL-HYDROCHLOROTHIAZIDE 10-25 MG PO TABS: 1.0000 | ORAL_TABLET | Freq: Every day | ORAL | Status: DC

## 2012-06-23 NOTE — Telephone Encounter (Signed)
Sent refill for enalapril/hctz

## 2012-06-23 NOTE — Telephone Encounter (Signed)
Patient is calling because she needs a refill on her Enalapril.  She says that she needs for all of her meds to be enough refills for a year.

## 2012-06-23 NOTE — Telephone Encounter (Signed)
Fwd. To Dr.Losq for refills. Lorenda Hatchet, Renato Battles

## 2012-08-18 ENCOUNTER — Telehealth: Payer: Self-pay | Admitting: Family Medicine

## 2012-08-18 MED ORDER — GLIMEPIRIDE 4 MG PO TABS: 4.0000 mg | ORAL_TABLET | Freq: Every day | ORAL | Status: DC

## 2012-08-18 NOTE — Telephone Encounter (Signed)
Called patient and told her she would need an appointment for refill, this medication was last filled in May for a 30 day supply and she has not taking it since then. Patient says she is not coming in for an appointment because she has no job no insurance and no money. Told patient she will have to eventually come in for an appointment to be evaluated and lab work in order for [place] to continue to prescribe her medications. Will forward the request to her PCP then call her back tomorrow. Will also try to set patient up with Britta Mccreedy to apply for the orange card.Gera Inboden, Rodena Medin

## 2012-08-18 NOTE — Telephone Encounter (Signed)
Patient is calling for a refill on glimepiride to go to [facility] [address], she is no longer using the [facility].  She would like a call when this has been done.

## 2012-08-18 NOTE — Telephone Encounter (Signed)
Will send for 1 month worth of glimepiride to hold patient over until she makes appointment.

## 2012-08-19 NOTE — Telephone Encounter (Signed)
Attempted to call patient, unable to leave message. Please let her know Rx was sent to pharmacy and to have her schedule appointment with Britta Mccreedy to apply for the orange card. She cannot have any further refill without an office visit.[place]ick, Rodena Medin

## 2013-04-29 ENCOUNTER — Other Ambulatory Visit: Payer: Self-pay

## 2013-06-03 ENCOUNTER — Telehealth: Payer: Self-pay | Admitting: *Deleted

## 2013-06-03 NOTE — Telephone Encounter (Signed)
Letter sent regarding diabetes follow up care Elizabeth Yaslyn Cumby, RN-BSN   

## 2013-06-17 ENCOUNTER — Ambulatory Visit (INDEPENDENT_AMBULATORY_CARE_PROVIDER_SITE_OTHER): Payer: Self-pay | Admitting: Family Medicine

## 2013-06-17 ENCOUNTER — Encounter: Payer: Self-pay | Admitting: Family Medicine

## 2013-06-17 ENCOUNTER — Telehealth: Payer: Self-pay | Admitting: Family Medicine

## 2013-06-17 VITALS — BP 131/72 | HR 86 | Temp 98.0°F | Ht 62.0 in | Wt 163.0 lb

## 2013-06-17 DIAGNOSIS — I10 Essential (primary) hypertension: Secondary | ICD-10-CM

## 2013-06-17 DIAGNOSIS — E119 Type 2 diabetes mellitus without complications: Secondary | ICD-10-CM

## 2013-06-17 DIAGNOSIS — R300 Dysuria: Secondary | ICD-10-CM

## 2013-06-17 DIAGNOSIS — E785 Hyperlipidemia, unspecified: Secondary | ICD-10-CM

## 2013-06-17 DIAGNOSIS — M545 Low back pain: Secondary | ICD-10-CM

## 2013-06-17 LAB — POCT URINALYSIS DIPSTICK
Leukocytes, UA: NEGATIVE
Protein, UA: NEGATIVE
Spec Grav, UA: 1.015
Urobilinogen, UA: 0.2

## 2013-06-17 MED ORDER — METFORMIN HCL 1000 MG PO TABS: 1000.0000 mg | ORAL_TABLET | Freq: Two times a day (BID) | ORAL | Status: DC

## 2013-06-17 MED ORDER — ENALAPRIL-HYDROCHLOROTHIAZIDE 10-25 MG PO TABS: 1.0000 | ORAL_TABLET | Freq: Every day | ORAL | Status: DC

## 2013-06-17 MED ORDER — GLIMEPIRIDE 4 MG PO TABS: 4.0000 mg | ORAL_TABLET | Freq: Every day | ORAL | Status: DC

## 2013-06-17 NOTE — Assessment & Plan Note (Addendum)
Uncontrolled as pt has not been taking medication due to cost (taking metformin and bp med, ran out of glimepiride 1 year ago, did not qualify for [employer]). No hypo/hyperglycemic symptoms.  - A1c checked today - Printed metformin, glimepiride, and enalapril-HCTZ prescriptions so that patient can get these for cheap at [facility] ($9.99 for 90 days). Told to call us if unable to get these. - Follow up as regularly scheduled with Dr. Gwenlyn Saran, discuss pt's diet/exercise goals (discussed medications at length this visit)

## 2013-06-17 NOTE — Progress Notes (Signed)
Patient ID: Grace Ferrell, female   DOB: 28-Oct-1965, 47 y.o.   MRN: [id number] Subjective:   CC: Follow up DM and here with dysuria/back pain  HPI:   1. Diabetes Mellitus - Pt currently taking metformin as prescribed, but has not been taking glimepiride 1 year due to affordability. Takes enalapril-HCTZ as prescribed but close to running out and also worried about cost. Denies medication side effects or signs of hyper- or hypoglycemia (blurred vision, frequent urination, anxiety, dizziness, passing out). Checks blood sugar at work sometimes and remembers the number 131. She did not qualify for [employer]. A1c 6.2 02/2012.  2. Hyperlipidemia - On problem list and pt previously on simvastatin but stopped this due to myalgias. Has not picked up subsequently prescribed pravastatin due to cost.   3. Dysuria and back pain - Dysuria on and off for ~1 week and chills with some back pain and crampy vaginal pain. Denies fevers.   Review of Systems - Per HPI.   SH: Smoker <10 cigarettes daily.    Objective:  Physical Exam BP 131/72  Pulse 86  Temp(Src) 98 F (36.7 C) (Oral)  Ht 5\' 2"  (1.575 m)  Wt 163 lb (73.936 kg)  BMI 29.81 kg/m2 GEN: NAD HEENT: Atraumatic, normocephalic, neck supple, EOMI, sclera clear  PULM:  normal effort SKIN: No rash or cyanosis; warm and well-perfused PSYCH: Mood and affect euthymic, normal rate and volume of speech NEURO: Awake, alert, no focal deficits grossly, normal speech  Assessment:     Grace Ferrell is a 47 y.o. female here for diabetes and hyperlipidemia medication management/follow-up and with dysuria.    Plan:     # See problem list for problem-specific plans.

## 2013-06-17 NOTE — Assessment & Plan Note (Signed)
Afebrile. Back pain and dysuria with some vaginal pain. - UA today - Follow-up with Dr. Gwenlyn Saran to discuss chronic pelvic pain.

## 2013-06-17 NOTE — Assessment & Plan Note (Addendum)
Not on a statin. Pt states she was on one (simvastatin) previously (caused myalgias) but would be willing to try another if affordable.  - Told to find out from [employer] which she could be on cheaply and let us know.

## 2013-06-17 NOTE — Telephone Encounter (Signed)
Please call to tell patient urine was normal with no signs of infection.  A1c was elevated from 6.2 in May 2013 to 8.4 at our visit. This is likely due in part to not taking glimepiride. All the more important to take medications as prescribed and follow-up with Dr. Gwenlyn Saran regularly, for another A1c in 3 months.  Leona Singleton, MD 06/17/2013 11:55 PM

## 2013-06-17 NOTE — Assessment & Plan Note (Signed)
Relatively good control on enalapril-HCTZ. Needs lower-cost option. - Printed rx, told pt about [facility] discount medication program, verbalized understanding

## 2013-06-17 NOTE — Patient Instructions (Addendum)
Diabetes: - We are checking A1c today. I will send you a letter with the result. - I am printing prescriptions. Check with [facility] and let me know if you can get these for an affordable price.  Cholesterol: - Call me with what medications [facility] has for cholesterol and we can try that.  Pelvic pain: Make an appointment to talk about this with Dr. Gwenlyn Saran. - Burning when you pee: We are checking urine today and I will call you if it is NOT  Normal.  Follow up with Dr. Gwenlyn Saran as regularly scheduled or in 3 months to follow up diabetes and high blood pressure.

## 2013-06-18 NOTE — Telephone Encounter (Signed)
Pt is aware of results. Grace Ferrell,CMA  

## 2013-06-18 NOTE — Telephone Encounter (Signed)
Tried to call pt and let her know lab results.  Please inform when she calls back.  Thanks [employer]

## 2013-07-02 ENCOUNTER — Ambulatory Visit: Payer: Self-pay | Admitting: Family Medicine

## 2013-07-31 ENCOUNTER — Ambulatory Visit: Payer: Self-pay

## 2013-07-31 ENCOUNTER — Ambulatory Visit: Payer: Self-pay | Admitting: Emergency Medicine

## 2013-07-31 VITALS — BP 120/80 | HR 85 | Temp 98.7°F | Resp 17 | Ht 62.5 in | Wt 165.0 lb

## 2013-07-31 DIAGNOSIS — J45909 Unspecified asthma, uncomplicated: Secondary | ICD-10-CM

## 2013-07-31 DIAGNOSIS — J3489 Other specified disorders of nose and nasal sinuses: Secondary | ICD-10-CM

## 2013-07-31 DIAGNOSIS — R0789 Other chest pain: Secondary | ICD-10-CM

## 2013-07-31 DIAGNOSIS — J309 Allergic rhinitis, unspecified: Secondary | ICD-10-CM

## 2013-07-31 DIAGNOSIS — E119 Type 2 diabetes mellitus without complications: Secondary | ICD-10-CM

## 2013-07-31 LAB — POCT CBC
HCT, POC: 42.3 % (ref 37.7–47.9)
Hemoglobin: 13.6 g/dL (ref 12.2–16.2)
Lymph, poc: 4.4 — AB (ref 0.6–3.4)
MCHC: 32.2 g/dL (ref 31.8–35.4)
POC Granulocyte: 3.8 (ref 2–6.9)
RBC: 4.95 M/uL (ref 4.04–5.48)
WBC: 8.8 10*3/uL (ref 4.6–10.2)

## 2013-07-31 LAB — TSH: TSH: 0.866 u[IU]/mL (ref 0.350–4.500)

## 2013-07-31 LAB — GLUCOSE, POCT (MANUAL RESULT ENTRY): POC Glucose: 137 mg/dl — AB (ref 70–99)

## 2013-07-31 LAB — T4, FREE: Free T4: 1.17 ng/dL (ref 0.80–1.80)

## 2013-07-31 LAB — POCT SEDIMENTATION RATE: POCT SED RATE: 29 mm/hr — AB (ref 0–22)

## 2013-07-31 IMAGING — CR DG CHEST 2V
2 series · 2 of 2 positions shown · non-contrast
Comparison: None.

CLINICAL DATA: Chest tightness. Hypertension.

EXAM:
CHEST  2 VIEW

[PA]
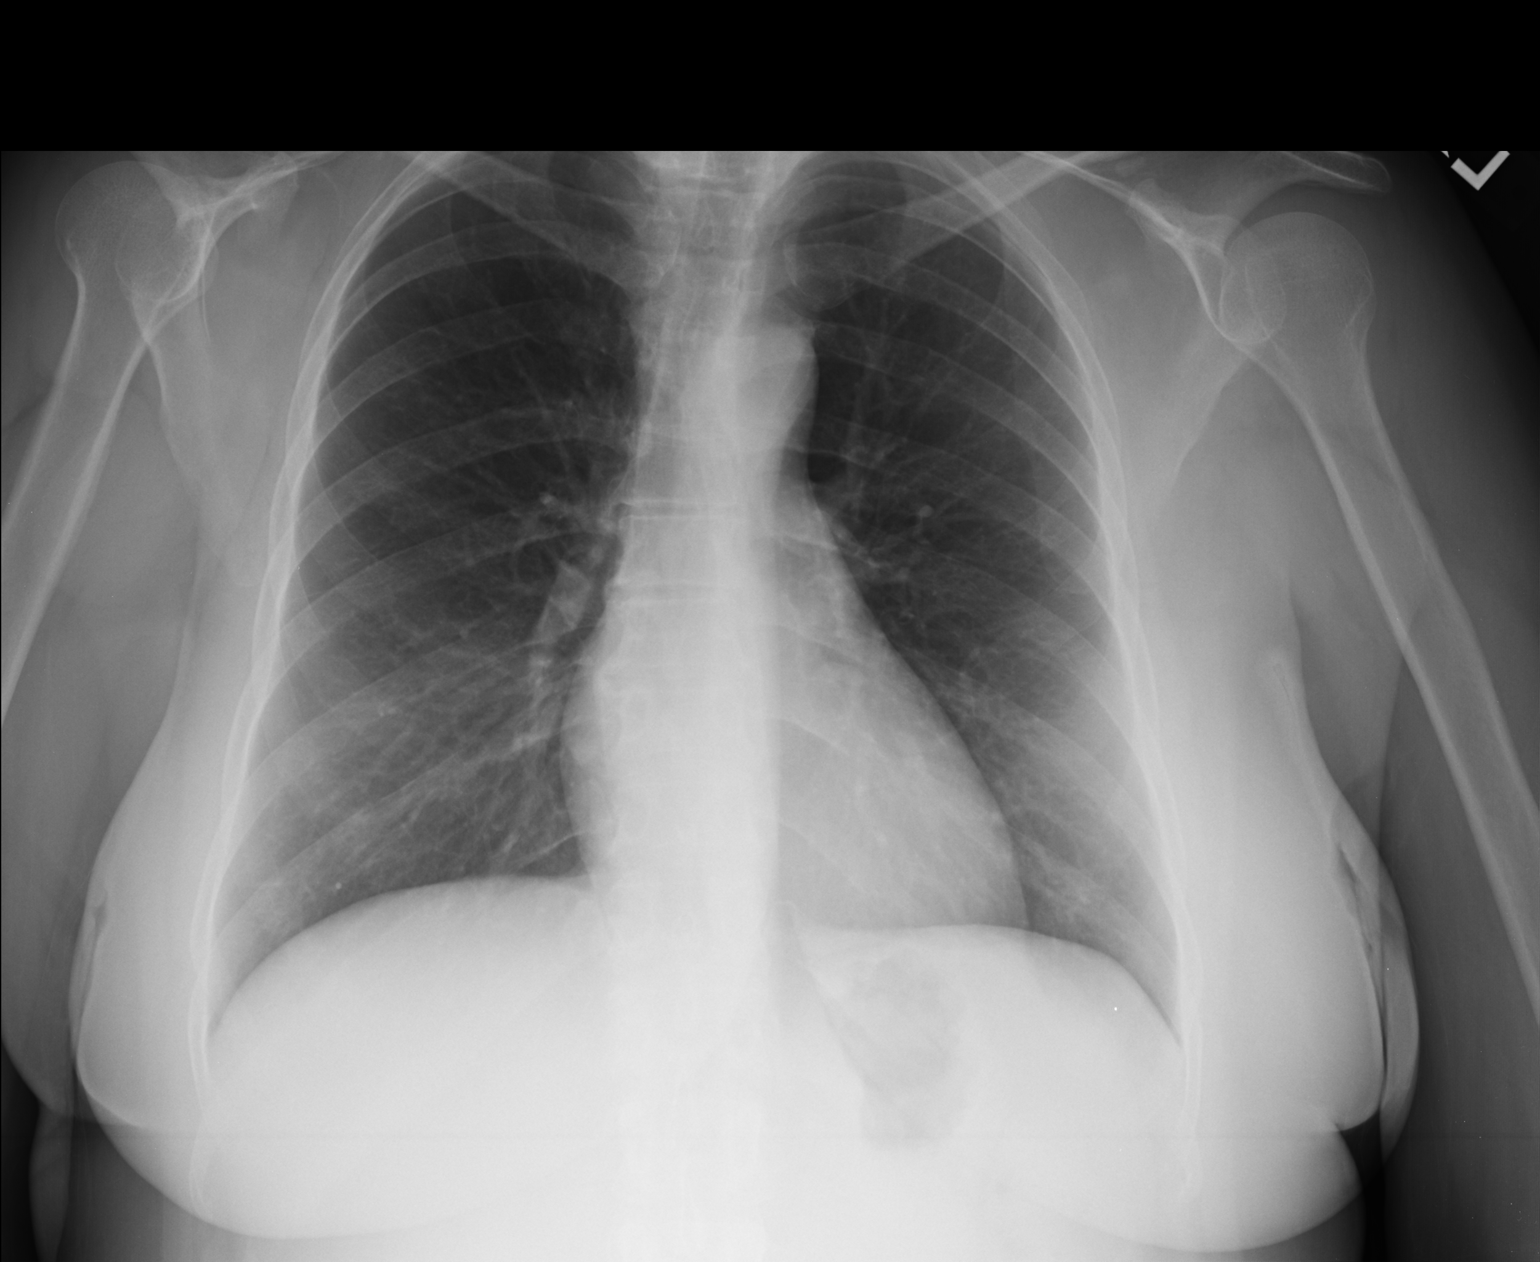

[lateral]
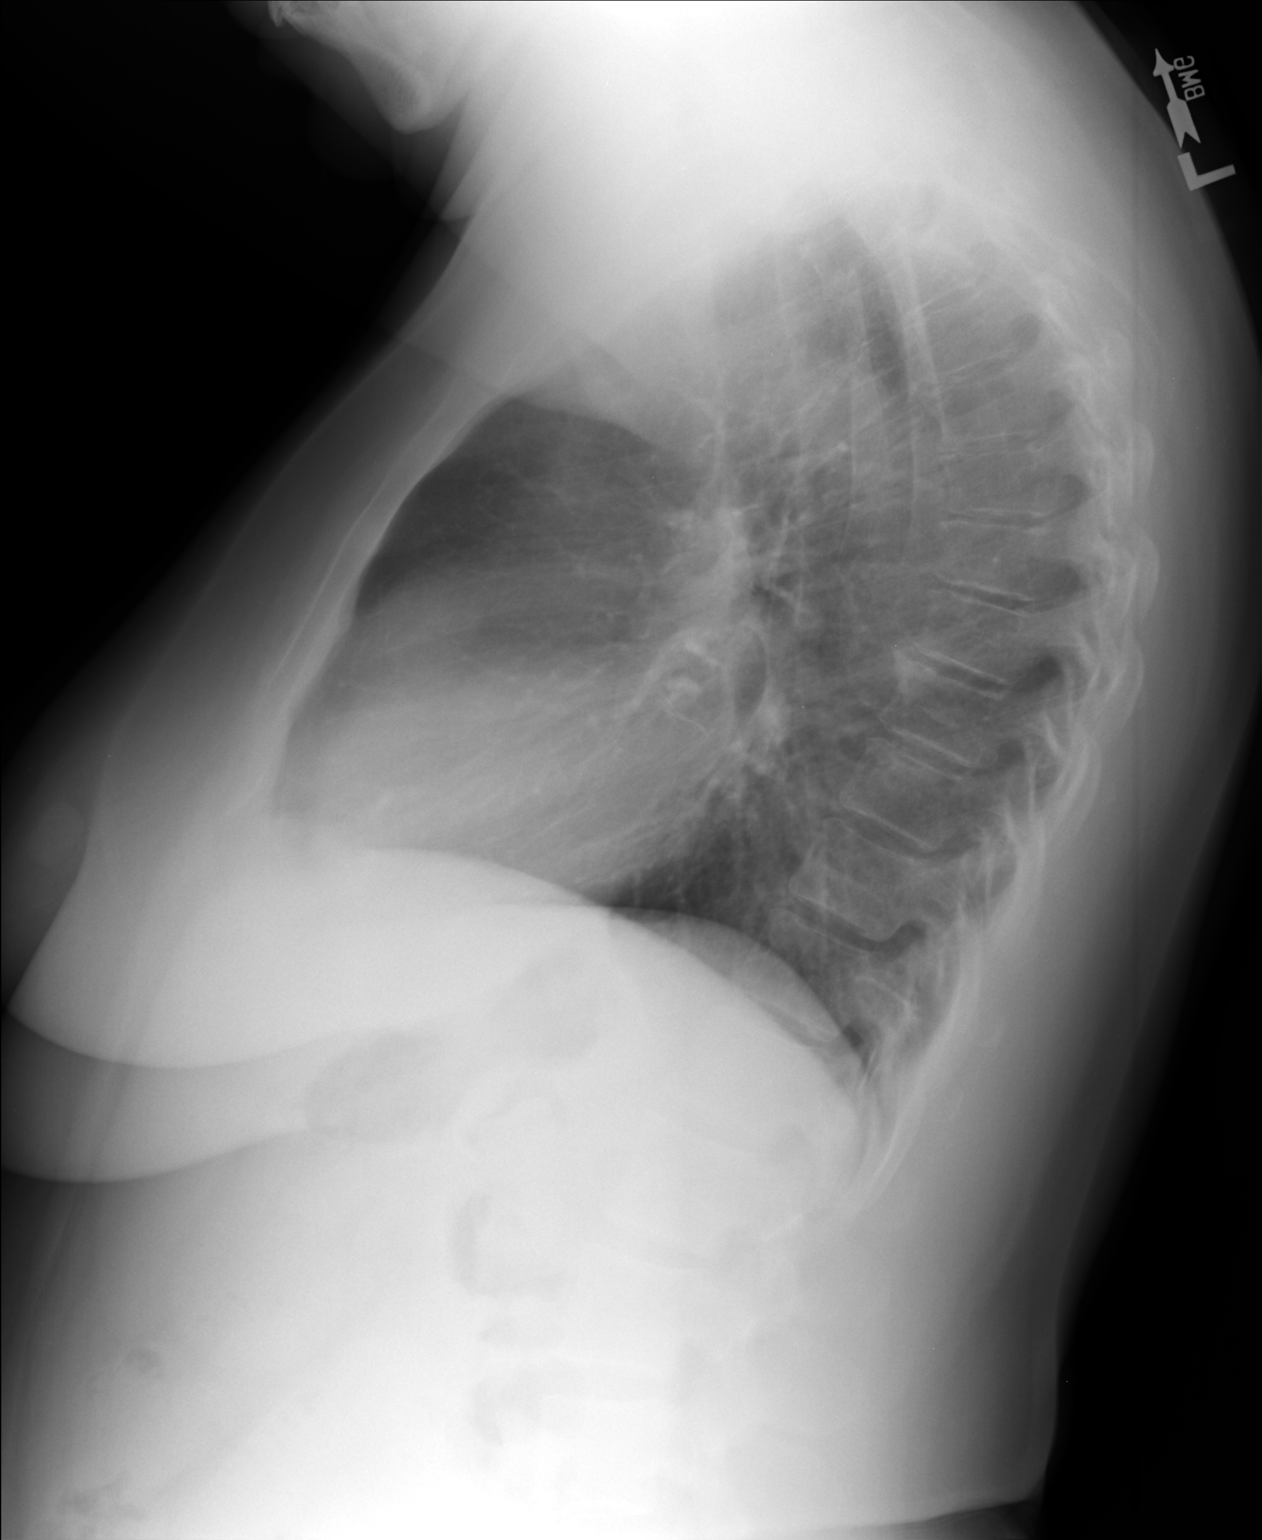

[2 of 2 positions shown; findings below may reference images not displayed]

FINDINGS: The heart size and mediastinal contours are within normal limits.
Both lungs are clear. The visualized skeletal structures are
unremarkable.
IMPRESSION: No active cardiopulmonary disease.

## 2013-07-31 IMAGING — CR DG SINUSES 1-2V
1 series · 1 of 1 positions shown · non-contrast
Comparison: None.

CLINICAL DATA: Facial and sinus pain and congestion.

EXAM:
PARANASAL SINUSES - 1-2 VIEW

[waters]
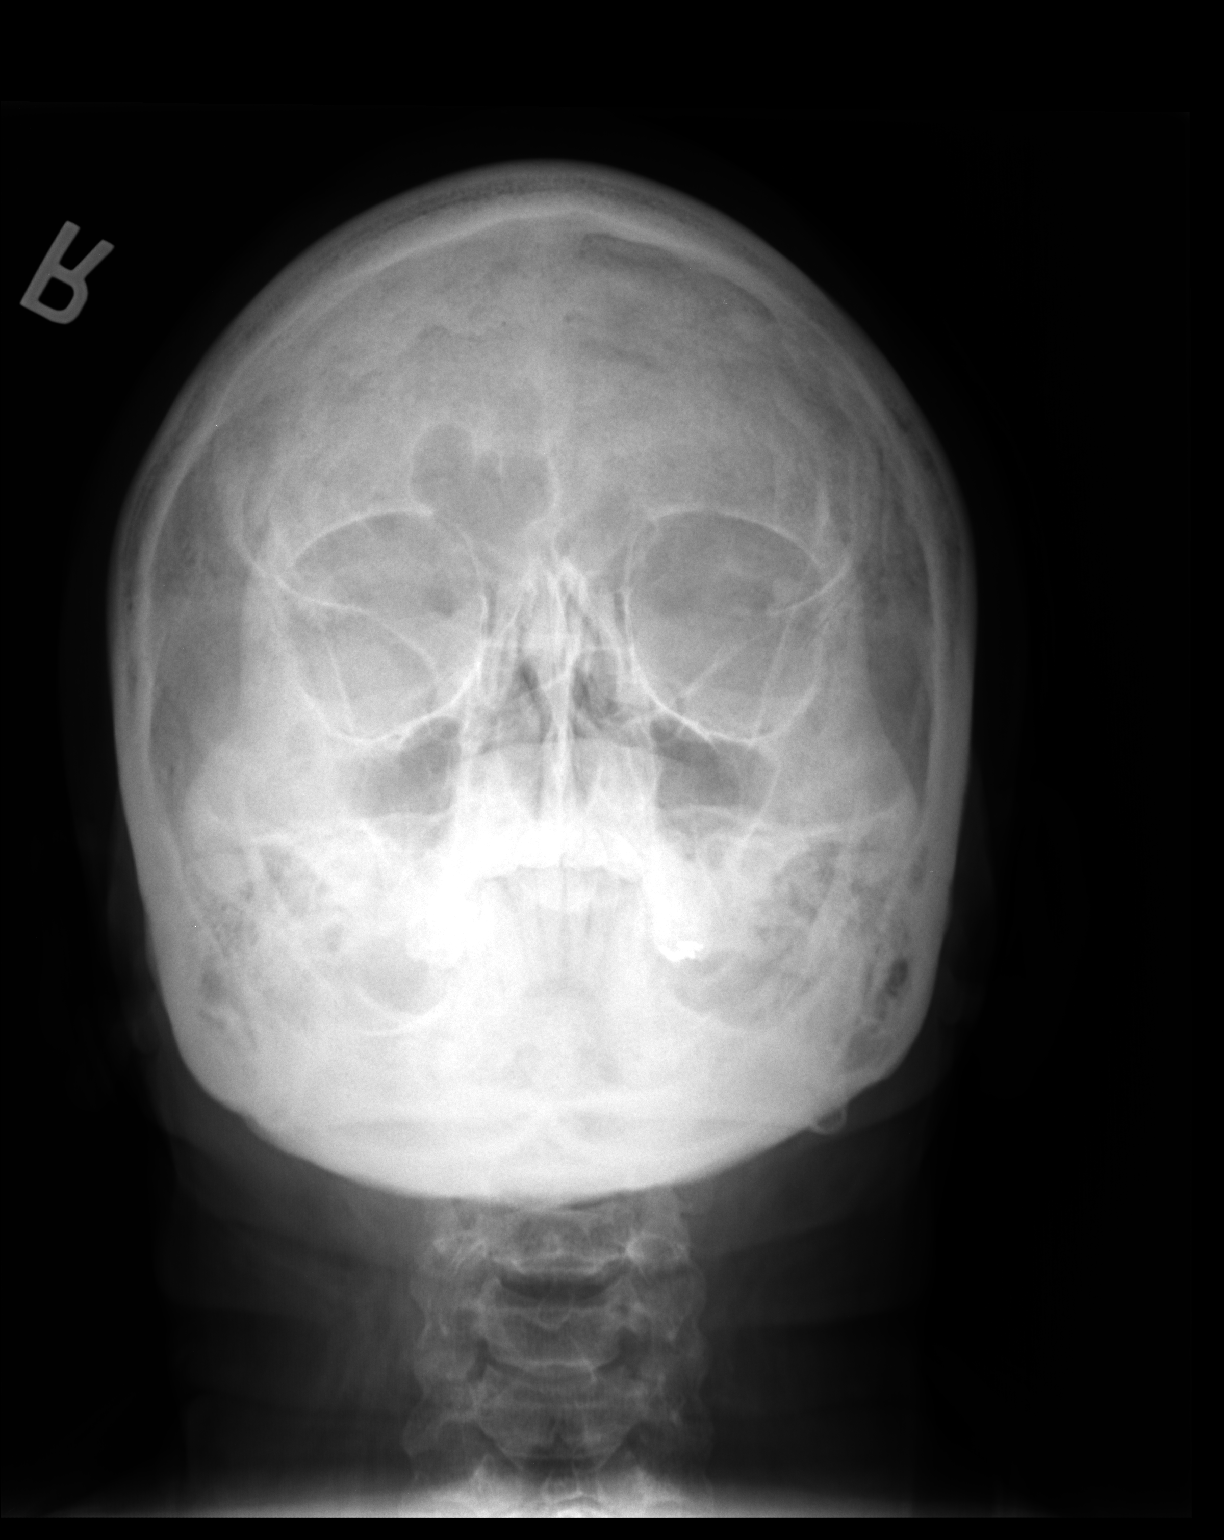

[1 of 1 positions shown; findings below may reference images not displayed]

FINDINGS: The paranasal sinus are aerated. There is no evidence of sinus
opacification air-fluid levels or mucosal thickening. No significant
bone abnormalities are seen.
IMPRESSION: Negative.

## 2013-07-31 MED ORDER — ALBUTEROL SULFATE (2.5 MG/3ML) 0.083% IN NEBU
2.5000 mg | INHALATION_SOLUTION | Freq: Once | RESPIRATORY_TRACT | Status: AC
  Administered 2013-07-31: 2.5 mg via RESPIRATORY_TRACT

## 2013-07-31 MED ORDER — MONTELUKAST SODIUM 10 MG PO TABS: 10.0000 mg | ORAL_TABLET | Freq: Every day | ORAL | Status: DC

## 2013-07-31 MED ORDER — FLUTICASONE PROPIONATE 50 MCG/ACT NA SUSP: 2.0000 | Freq: Every day | NASAL | Status: DC

## 2013-07-31 MED ORDER — ALBUTEROL SULFATE HFA 108 (90 BASE) MCG/ACT IN AERS: 2.0000 | INHALATION_SPRAY | RESPIRATORY_TRACT | Status: DC | PRN

## 2013-07-31 MED ORDER — AZELASTINE HCL 0.1 % NA SOLN: 2.0000 | Freq: Two times a day (BID) | NASAL | Status: DC

## 2013-07-31 NOTE — Patient Instructions (Signed)
Allergic Rhinitis Allergic rhinitis is when the mucous membranes in the nose respond to allergens. Allergens are particles in the air that cause your body to have an allergic reaction. This causes you to release allergic antibodies. Through a chain of events, these eventually cause you to release histamine into the blood stream (hence the use of antihistamines). Although meant to be protective to the body, it is this release that causes your discomfort, such as frequent sneezing, congestion and an itchy runny nose.  CAUSES  The pollen allergens may come from grasses, trees, and weeds. This is seasonal allergic rhinitis, or "hay fever." Other allergens cause year-round allergic rhinitis (perennial allergic rhinitis) such as house dust mite allergen, pet dander and mold spores.  SYMPTOMS   Nasal stuffiness (congestion).  Runny, itchy nose with sneezing and tearing of the eyes.  There is often an itching of the mouth, eyes and ears. It cannot be cured, but it can be controlled with medications. DIAGNOSIS  If you are unable to determine the offending allergen, skin or blood testing may find it. TREATMENT   Avoid the allergen.  Medications and allergy shots (immunotherapy) can help.  Hay fever may often be treated with antihistamines in pill or nasal spray forms. Antihistamines block the effects of histamine. There are over-the-counter medicines that may help with nasal congestion and swelling around the eyes. Check with your caregiver before taking or giving this medicine. If the treatment above does not work, there are many new medications your caregiver can prescribe. Stronger medications may be used if initial measures are ineffective. Desensitizing injections can be used if medications and avoidance fails. Desensitization is when a patient is given ongoing shots until the body becomes less sensitive to the allergen. Make sure you follow up with your caregiver if problems continue. SEEK MEDICAL  CARE IF:   You develop fever (more than 100.5 F (38.1 C).  You develop a cough that does not stop easily (persistent).  You have shortness of breath.  You start wheezing.  Symptoms interfere with normal daily activities. Document Released: 07/02/2001 Document Revised: 12/30/2011 Document Reviewed: 01/11/2009 [employer] Patient Information 2014 [employer].  

## 2013-07-31 NOTE — Progress Notes (Addendum)
Subjective:  This chart was scribed for Lucilla Edin, MD by Arlan Organ, ED Scribe. This patient was seen in room Room 11 and the patient's care was started 12:04 PM.   Patient ID: Grace Ferrell, female    DOB: 03/15/1966, 47 y.o.   MRN: [id number]  HPI HPI Comments: Grace Ferrell is a 47 y.o. female who presents to [facility] complaining of a sinus infection that she states started 6 months ago to a year. Pt states she is also experiencing associated facial pain, and a non productive cough. Pt denies teeth pain. Pt states she has a dog that is currently living in her home, however, she states the dog stays in her daughters room. She states she is taking zyrtec, claritin, saline solution, and severe sinus medication OTC. Pt denies fever.   Review of Systems  Constitutional: Negative for fever.  HENT: Positive for congestion and sinus pressure.     Past Medical History  Diagnosis Date  . Hypertension   . Diabetes mellitus   . Hypercholesteremia     History   Social History  . Marital Status: Single    Spouse Name: N/A    Number of Children: N/A  . Years of Education: N/A   Occupational History  . Not on file.   Social History Main Topics  . Smoking status: Current Every Day Smoker -- 0.50 packs/day for 21 years    Types: Cigarettes  . Smokeless tobacco: Not on file  . Alcohol Use: No  . Drug Use: No  . Sexual Activity: Not on file   Other Topics Concern  . Not on file   Social History Narrative  . No narrative on file    Past Surgical History  Procedure Laterality Date  . Knee surgery    . Tubal ligation    . Endometrial ablation      No family history on file.   Results for orders placed in visit on 06/17/13  POCT GLYCOSYLATED HEMOGLOBIN (HGB A1C)      Result Value Range   Hemoglobin A1C 8.4    POCT URINALYSIS DIPSTICK      Result Value Range   Color, UA YELLOW     Clarity, UA CLEAR     Glucose, UA NEG     Bilirubin, UA NEG     Ketones, UA NEG     Spec Grav, UA  1.015     Blood, UA NEG     pH, UA 5.5     Protein, UA NEG     Urobilinogen, UA 0.2     Nitrite, UA NEG     Leukocytes, UA Negative      Objective:  Physical Exam  Constitutional: She is oriented to person, place, and time. She appears well-developed and well-nourished. No distress.  HENT:  Head: Normocephalic.  Right Ear: Tympanic membrane normal.  Left Ear: Tympanic membrane normal.  TM's clear  Eyes: Conjunctivae are normal. Pupils are equal, round, and reactive to light. No scleral icterus.  Neck: Normal range of motion. Neck supple. No thyromegaly present.  Thyroid symmetrically enlarged bilaterally  Cardiovascular: Normal rate and regular rhythm.  Exam reveals no gallop and no friction rub.   No murmur heard. Pulmonary/Chest: Effort normal and breath sounds normal. No respiratory distress. She has no wheezes. She has no rales.  Abdominal: Soft. Bowel sounds are normal. She exhibits no distension. There is no tenderness. There is no rebound.  Musculoskeletal: Normal range of motion.  Neurological: She  is alert and oriented to person, place, and time.  Skin: Skin is warm and dry. No rash noted.  Psychiatric: She has a normal mood and affect. Her behavior is normal.   [facility] reading (PRIMARY) by  Dr. Cleta Alberts there are no air-fluid levels noted. Chest x-ray does not show any infiltrates normal heart size  Results for orders placed in visit on 07/31/13  POCT CBC      Result Value Range   WBC 8.8  4.6 - 10.2 K/uL   Lymph, poc 4.4 (*) 0.6 - 3.4   POC LYMPH PERCENT 49.7  10 - 50 %L   MID (cbc) 0.7  0 - 0.9   POC MID % 7.6  0 - 12 %M   POC Granulocyte 3.8  2 - 6.9   Granulocyte percent 42.7  37 - 80 %G   RBC 4.95  4.04 - 5.48 M/uL   Hemoglobin 13.6  12.2 - 16.2 g/dL   HCT, POC 09.8  11.9 - 47.9 %   MCV 85.5  80 - 97 fL   MCH, POC 27.5  27 - 31.2 pg   MCHC 32.2  31.8 - 35.4 g/dL   RDW, POC 14.7     Platelet Count, POC 302  142 - 424 K/uL   MPV 8.6  0 - 99.8 fL  GLUCOSE, POCT  (MANUAL RESULT ENTRY)      Result Value Range   POC Glucose 137 (*) 70 - 99 mg/dl        Assessment & Plan:  I denies any air-fluid levels on x-ray. We'll treat with Astelin and Singulair. We'll try to avoid steroids because of her diabetes. I did give her an albuterol treatment to see if this improved her peak flow but I did not hear any wheezing on her examination the

## 2013-08-01 ENCOUNTER — Other Ambulatory Visit: Payer: Self-pay | Admitting: *Deleted

## 2013-08-01 DIAGNOSIS — E049 Nontoxic goiter, unspecified: Secondary | ICD-10-CM

## 2013-08-02 ENCOUNTER — Ambulatory Visit: Payer: Self-pay | Admitting: Family Medicine

## 2013-11-06 ENCOUNTER — Other Ambulatory Visit: Payer: Self-pay | Admitting: Family Medicine

## 2013-11-09 ENCOUNTER — Telehealth: Payer: Self-pay | Admitting: Family Medicine

## 2013-11-09 DIAGNOSIS — E119 Type 2 diabetes mellitus without complications: Secondary | ICD-10-CM

## 2013-11-09 DIAGNOSIS — I10 Essential (primary) hypertension: Secondary | ICD-10-CM

## 2013-11-09 MED ORDER — METFORMIN HCL 1000 MG PO TABS: 1000.0000 mg | ORAL_TABLET | Freq: Two times a day (BID) | ORAL | Status: DC

## 2013-11-09 MED ORDER — GLIMEPIRIDE 4 MG PO TABS: 4.0000 mg | ORAL_TABLET | Freq: Every day | ORAL | Status: DC

## 2013-11-09 MED ORDER — ENALAPRIL-HYDROCHLOROTHIAZIDE 10-25 MG PO TABS: 1.0000 | ORAL_TABLET | Freq: Every day | ORAL | Status: DC

## 2013-11-09 NOTE — Telephone Encounter (Signed)
Called pt. Informed. .Grace Ferrell  

## 2013-11-09 NOTE — Telephone Encounter (Signed)
Refilled Rx for glipizide, metformin and lisinopril/hctz. Please let patient know that I have sent the prescriptions to the pharmacy. Thank you!  Marena Chancy, PGY-3 Family Medicine Resident

## 2013-11-09 NOTE — Telephone Encounter (Signed)
Pt coming to see me on 1/27 for [facility] card.  Will make an appt at front desk after receipt.  Please send request for refill on BP and DM medication until her appt with you.

## 2013-11-16 ENCOUNTER — Ambulatory Visit: Payer: No Typology Code available for payment source

## 2013-11-23 ENCOUNTER — Ambulatory Visit (INDEPENDENT_AMBULATORY_CARE_PROVIDER_SITE_OTHER): Payer: No Typology Code available for payment source | Admitting: Family Medicine

## 2013-11-23 ENCOUNTER — Encounter: Payer: Self-pay | Admitting: Family Medicine

## 2013-11-23 ENCOUNTER — Ambulatory Visit (HOSPITAL_COMMUNITY)
Admission: RE | Admit: 2013-11-23 | Discharge: 2013-11-23 | Disposition: A | Discharge status: Home / Self Care | Payer: No Typology Code available for payment source | Source: Ambulatory Visit | Attending: Family Medicine | Admitting: Family Medicine

## 2013-11-23 VITALS — BP 128/84 | HR 89 | Temp 98.3°F | Ht 62.5 in | Wt 163.0 lb

## 2013-11-23 DIAGNOSIS — R05 Cough: Secondary | ICD-10-CM | POA: Insufficient documentation

## 2013-11-23 DIAGNOSIS — E119 Type 2 diabetes mellitus without complications: Secondary | ICD-10-CM

## 2013-11-23 DIAGNOSIS — J322 Chronic ethmoidal sinusitis: Secondary | ICD-10-CM

## 2013-11-23 DIAGNOSIS — R059 Cough, unspecified: Secondary | ICD-10-CM | POA: Insufficient documentation

## 2013-11-23 LAB — POCT GLYCOSYLATED HEMOGLOBIN (HGB A1C): Hemoglobin A1C: 7

## 2013-11-23 IMAGING — CR DG CHEST 2V
2 series · 2 of 2 positions shown · non-contrast
Comparison: DG CHEST 2V dated [DATE]

CLINICAL DATA: Persistent cough

EXAM:
CHEST  2 VIEW

[w chest pa]
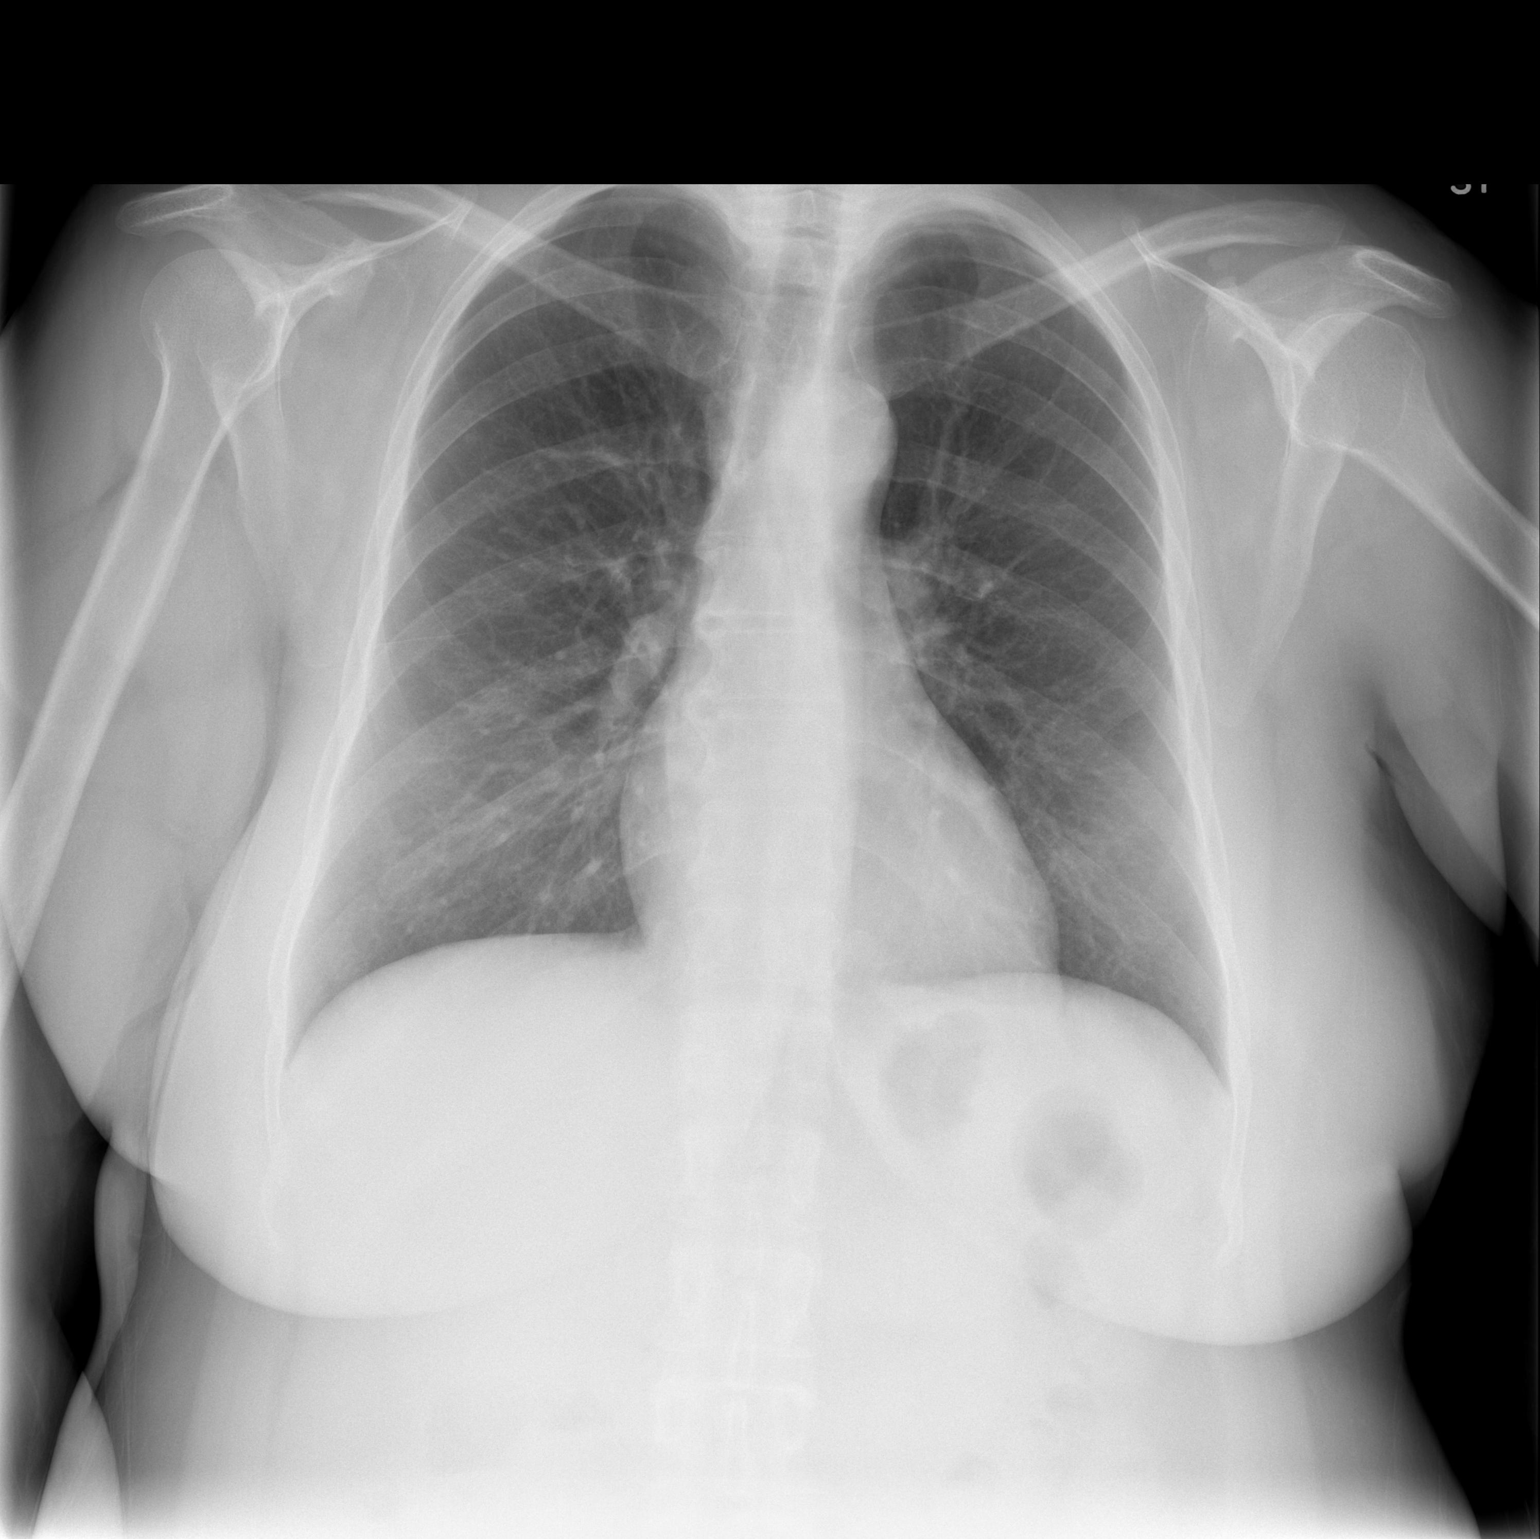

[w chest lat]
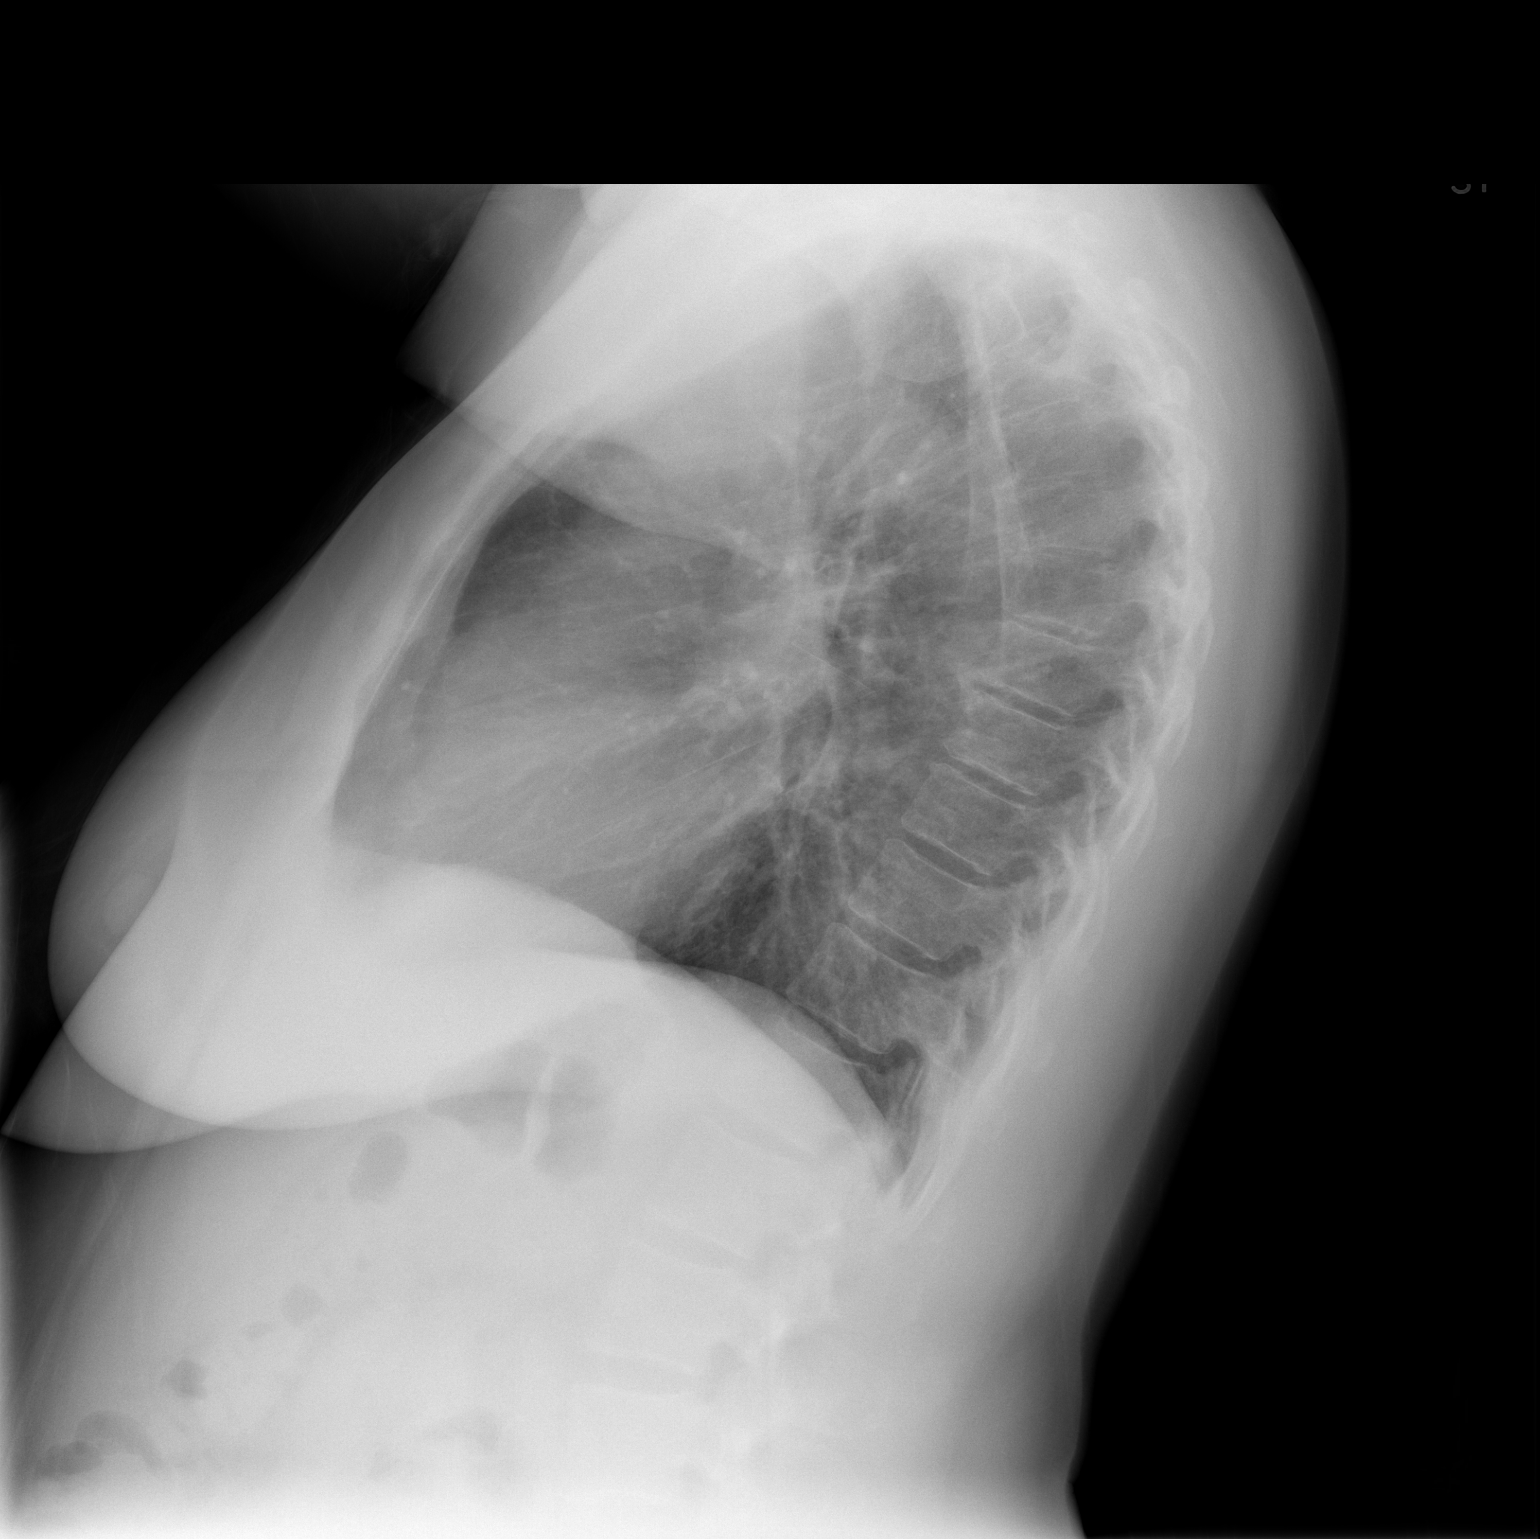

[2 of 2 positions shown; findings below may reference images not displayed]

FINDINGS: The heart size and mediastinal contours are within normal limits.
Both lungs are clear. The visualized skeletal structures are
unremarkable.
IMPRESSION: No active cardiopulmonary disease.

## 2013-11-23 MED ORDER — IPRATROPIUM BROMIDE 0.06 % NA SOLN: 2.0000 | Freq: Four times a day (QID) | NASAL | Status: DC

## 2013-11-23 MED ORDER — RANITIDINE HCL 150 MG PO CAPS: 150.0000 mg | ORAL_CAPSULE | Freq: Two times a day (BID) | ORAL | Status: DC

## 2013-11-23 MED ORDER — AMOXICILLIN-POT CLAVULANATE 875-125 MG PO TABS: 1.0000 | ORAL_TABLET | Freq: Two times a day (BID) | ORAL | Status: DC

## 2013-11-23 NOTE — Progress Notes (Signed)
Patient ID: Grace Ferrell    DOB: Dec 10, 1965, 48 y.o.   MRN: [id number] --- Subjective:  Grace Ferrell is a 48 y.o.female who presents for follow up on DM2 and for evaluation of cough and congestion.   # Diabetes: - medications: metformin 1000mg  bid and glimepiride 4mg  dail - missed doses in 1 week: 0 - hypoglycemic events: none - CBG's at home: doesn't check - frequency of monitoring: 0 - changes in vision? no - numbness or tingling in lower extremities? no - sores or ulcers? no - seen by podiatry? no - last eye check? 2013 - exercise: walks 3 times for 30 minutes  # Congestion and cough: ongoing for several weeks, pressure around the eyes with congestion and rhinorrhea. Cough is non productive, worst at night. She has been having some difficulty breathing through her nose but no shortness of breath. She has been using robitussin with little help. She feels like symptoms got better and then got worst in the last 2 weeks. No fever.  She also reports some burning sensation in her epigastrium and pain there too.   ROS: see HPI Past Medical History: reviewed and updated medications and allergies. Social History: Tobacco: less than 5 cigs  Objective: Filed Vitals:   11/23/13 1445  BP: 128/84  Pulse: 89  Temp: 98.3 F (36.8 C)    Physical Examination:   General appearance - alert, well appearing, and in no distress Ears -cerumen present bilaterally, difficult to clearly visualize TM Nose - congested and erythematous nasal turbinates, tenderness along maxillary sinus bilaterally Mouth - mucous membranes moist, erythematous oropharynx Chest - clear to auscultation, no wheezes, rales or rhonchi, symmetric air entry Heart - normal rate, regular rhythm, normal S1, S2, no murmurs, rubs, clicks or gallops Abdomen - soft, mild tenderness in epigastrium, negative Murphy's, no rebound, no guarding Extremities - no edema, sensation to light touch intact bilaterally

## 2013-11-23 NOTE — Patient Instructions (Signed)
Please follow up with me in 2 weeks.   Sinusitis Sinusitis is redness, soreness, and swelling (inflammation) of the paranasal sinuses. Paranasal sinuses are air pockets within the bones of your face (beneath the eyes, the middle of the forehead, or above the eyes). In healthy paranasal sinuses, mucus is able to drain out, and air is able to circulate through them by way of your nose. However, when your paranasal sinuses are inflamed, mucus and air can become trapped. This can allow bacteria and other germs to grow and cause infection. Sinusitis can develop quickly and last only a short time (acute) or continue over a long period (chronic). Sinusitis that lasts for more than 12 weeks is considered chronic.  CAUSES  Causes of sinusitis include:  Allergies.  Structural abnormalities, such as displacement of the cartilage that separates your nostrils (deviated septum), which can decrease the air flow through your nose and sinuses and affect sinus drainage.  Functional abnormalities, such as when the small hairs (cilia) that line your sinuses and help remove mucus do not work properly or are not present. SYMPTOMS  Symptoms of acute and chronic sinusitis are the same. The primary symptoms are pain and pressure around the affected sinuses. Other symptoms include:  Upper toothache.  Earache.  Headache.  Bad breath.  Decreased sense of smell and taste.  A cough, which worsens when you are lying flat.  Fatigue.  Fever.  Thick drainage from your nose, which often is green and may contain pus (purulent).  Swelling and warmth over the affected sinuses. DIAGNOSIS  Your caregiver will perform a physical exam. During the exam, your caregiver may:  Look in your nose for signs of abnormal growths in your nostrils (nasal polyps).  Tap over the affected sinus to check for signs of infection.  View the inside of your sinuses (endoscopy) with a special imaging device with a light attached  (endoscope), which is inserted into your sinuses. If your caregiver suspects that you have chronic sinusitis, one or more of the following tests may be recommended:  Allergy tests.  Nasal culture A sample of mucus is taken from your nose and sent to a lab and screened for bacteria.  Nasal cytology A sample of mucus is taken from your nose and examined by your caregiver to determine if your sinusitis is related to an allergy. TREATMENT  Most cases of acute sinusitis are related to a viral infection and will resolve on their own within 10 days. Sometimes medicines are prescribed to help relieve symptoms (pain medicine, decongestants, nasal steroid sprays, or saline sprays).  However, for sinusitis related to a bacterial infection, your caregiver will prescribe antibiotic medicines. These are medicines that will help kill the bacteria causing the infection.  Rarely, sinusitis is caused by a fungal infection. In theses cases, your caregiver will prescribe antifungal medicine. For some cases of chronic sinusitis, surgery is needed. Generally, these are cases in which sinusitis recurs more than 3 times per year, despite other treatments. HOME CARE INSTRUCTIONS   Drink plenty of water. Water helps thin the mucus so your sinuses can drain more easily.  Use a humidifier.  Inhale steam 3 to 4 times a day (for example, sit in the bathroom with the shower running).  Apply a warm, moist washcloth to your face 3 to 4 times a day, or as directed by your caregiver.  Use saline nasal sprays to help moisten and clean your sinuses.  Take over-the-counter or prescription medicines for pain, discomfort, or  fever only as directed by your caregiver. SEEK IMMEDIATE MEDICAL CARE IF:  You have increasing pain or severe headaches.  You have nausea, vomiting, or drowsiness.  You have swelling around your face.  You have vision problems.  You have a stiff neck.  You have difficulty breathing. MAKE SURE  YOU:   Understand these instructions.  Will watch your condition.  Will get help right away if you are not doing well or get worse. Document Released: 10/07/2005 Document Revised: 12/30/2011 Document Reviewed: 10/22/2011 [facility] Patient Information 2014 [place], [place].

## 2013-11-24 DIAGNOSIS — J019 Acute sinusitis, unspecified: Secondary | ICD-10-CM | POA: Insufficient documentation

## 2013-11-24 DIAGNOSIS — J0140 Acute pansinusitis, unspecified: Secondary | ICD-10-CM | POA: Insufficient documentation

## 2013-11-24 NOTE — Assessment & Plan Note (Addendum)
Improved from previous with A1C of 7.0 from 8.4. Continue metformin and glimepiride for now.  Patient is hesitant to increase medications for now and feels like her CBG's will be better when she starts feeling better  - continue metformin 1000mg  bid and amaryl 4mg  daily for now. If continues to be higher than 7 in 3 months, will increase amaryl.  - will place opthalmology referral for evaluation of diabetic retinopathy - will get CMP and lipid panel when patient follows up in 2 weeks.

## 2013-11-25 ENCOUNTER — Telehealth: Payer: Self-pay | Admitting: Family Medicine

## 2013-11-25 NOTE — Telephone Encounter (Signed)
Tried calling patient back to let her know that the chest xray results were normal but got answering machine and did not leave message.  Please call patient to let her know that her CXR was normal. Thank you!  Marena Chancy, PGY-3 Family Medicine Resident

## 2013-11-25 NOTE — Telephone Encounter (Signed)
Patient would like the results from her xray.

## 2013-11-25 NOTE — Assessment & Plan Note (Addendum)
Given persistent rhinorrhea and congestion for over 1 week and second sickening syndrome, sinusitis is likely.  Will treat with course of augmentin and follow up in 2 weeks.  - check CXR to rule out PNA in setting of persistent cough and pain with coughing.  - Also recommended ranitidine in case of silent GERD as contributor to cough.  - recommended anihistamine but patient has had side effects with both zyrtec and claritin. Also suggested nasal steroid but patient states that she was overly hyperactive when she used it.  Will prescribe ipratropium nasal spray for congestion.

## 2013-11-26 NOTE — Telephone Encounter (Signed)
Called pt. Informed. .Easter Schinke  

## 2013-11-30 ENCOUNTER — Other Ambulatory Visit: Payer: No Typology Code available for payment source

## 2013-12-01 ENCOUNTER — Other Ambulatory Visit: Payer: No Typology Code available for payment source

## 2013-12-01 DIAGNOSIS — E119 Type 2 diabetes mellitus without complications: Secondary | ICD-10-CM

## 2013-12-01 NOTE — Progress Notes (Signed)
CMP AND FLP DONE TODAY Jeromie Gainor 

## 2013-12-02 LAB — COMPREHENSIVE METABOLIC PANEL
ALT: <8 U/L (ref 0–35)
AST: 10 U/L (ref 0–37)
Albumin: 4.1 g/dL (ref 3.5–5.2)
Alkaline Phosphatase: 91 U/L (ref 39–117)
BUN: 11 mg/dL (ref 6–23)
CALCIUM: 10 mg/dL (ref 8.4–10.5)
CHLORIDE: 100 meq/L (ref 96–112)
CO2: 28 meq/L (ref 19–32)
Creat: 0.76 mg/dL (ref 0.50–1.10)
GLUCOSE: 219 mg/dL — AB (ref 70–99)
Potassium: 4 mEq/L (ref 3.5–5.3)
SODIUM: 137 meq/L (ref 135–145)
Total Bilirubin: 0.3 mg/dL (ref 0.2–1.2)
Total Protein: 6.9 g/dL (ref 6.0–8.3)

## 2013-12-02 LAB — LIPID PANEL
Cholesterol: 258 mg/dL — ABNORMAL HIGH (ref 0–200)
HDL: 37 mg/dL — ABNORMAL LOW (ref >39)
LDL Cholesterol: 164 mg/dL — ABNORMAL HIGH (ref 0–99)
TRIGLYCERIDES: 287 mg/dL — AB (ref <150)
Total CHOL/HDL Ratio: 7 Ratio
VLDL: 57 mg/dL — ABNORMAL HIGH (ref 0–40)

## 2013-12-08 ENCOUNTER — Encounter: Payer: Self-pay | Admitting: Family Medicine

## 2013-12-08 ENCOUNTER — Ambulatory Visit (INDEPENDENT_AMBULATORY_CARE_PROVIDER_SITE_OTHER): Payer: No Typology Code available for payment source | Admitting: Family Medicine

## 2013-12-08 VITALS — BP 150/84 | HR 91 | Temp 98.7°F | Ht 62.0 in | Wt 167.0 lb

## 2013-12-08 DIAGNOSIS — E785 Hyperlipidemia, unspecified: Secondary | ICD-10-CM

## 2013-12-08 DIAGNOSIS — J329 Chronic sinusitis, unspecified: Secondary | ICD-10-CM

## 2013-12-08 MED ORDER — AMOXICILLIN-POT CLAVULANATE 875-125 MG PO TABS: 1.0000 | ORAL_TABLET | Freq: Two times a day (BID) | ORAL | Status: DC

## 2013-12-08 MED ORDER — ATORVASTATIN CALCIUM 40 MG PO TABS: 40.0000 mg | ORAL_TABLET | Freq: Every day | ORAL | Status: DC

## 2013-12-08 NOTE — Patient Instructions (Signed)
Please start taking the augmentin for 3 weeks.   Start taking the nasal steroid: triamcinolone (nasocort) or flonase.   Continue with the ipratropium spray and the saline spray.   Sinusitis Sinusitis is redness, soreness, and swelling (inflammation) of the paranasal sinuses. Paranasal sinuses are air pockets within the bones of your face (beneath the eyes, the middle of the forehead, or above the eyes). In healthy paranasal sinuses, mucus is able to drain out, and air is able to circulate through them by way of your nose. However, when your paranasal sinuses are inflamed, mucus and air can become trapped. This can allow bacteria and other germs to grow and cause infection. Sinusitis can develop quickly and last only a short time (acute) or continue over a long period (chronic). Sinusitis that lasts for more than 12 weeks is considered chronic.  CAUSES  Causes of sinusitis include:  Allergies.  Structural abnormalities, such as displacement of the cartilage that separates your nostrils (deviated septum), which can decrease the air flow through your nose and sinuses and affect sinus drainage.  Functional abnormalities, such as when the small hairs (cilia) that line your sinuses and help remove mucus do not work properly or are not present. SYMPTOMS  Symptoms of acute and chronic sinusitis are the same. The primary symptoms are pain and pressure around the affected sinuses. Other symptoms include:  Upper toothache.  Earache.  Headache.  Bad breath.  Decreased sense of smell and taste.  A cough, which worsens when you are lying flat.  Fatigue.  Fever.  Thick drainage from your nose, which often is green and may contain pus (purulent).  Swelling and warmth over the affected sinuses. DIAGNOSIS  Your caregiver will perform a physical exam. During the exam, your caregiver may:  Look in your nose for signs of abnormal growths in your nostrils (nasal polyps).  Tap over the affected  sinus to check for signs of infection.  View the inside of your sinuses (endoscopy) with a special imaging device with a light attached (endoscope), which is inserted into your sinuses. If your caregiver suspects that you have chronic sinusitis, one or more of the following tests may be recommended:  Allergy tests.  Nasal culture A sample of mucus is taken from your nose and sent to a lab and screened for bacteria.  Nasal cytology A sample of mucus is taken from your nose and examined by your caregiver to determine if your sinusitis is related to an allergy. TREATMENT  Most cases of acute sinusitis are related to a viral infection and will resolve on their own within 10 days. Sometimes medicines are prescribed to help relieve symptoms (pain medicine, decongestants, nasal steroid sprays, or saline sprays).  However, for sinusitis related to a bacterial infection, your caregiver will prescribe antibiotic medicines. These are medicines that will help kill the bacteria causing the infection.  Rarely, sinusitis is caused by a fungal infection. In theses cases, your caregiver will prescribe antifungal medicine. For some cases of chronic sinusitis, surgery is needed. Generally, these are cases in which sinusitis recurs more than 3 times per year, despite other treatments. HOME CARE INSTRUCTIONS   Drink plenty of water. Water helps thin the mucus so your sinuses can drain more easily.  Use a humidifier.  Inhale steam 3 to 4 times a day (for example, sit in the bathroom with the shower running).  Apply a warm, moist washcloth to your face 3 to 4 times a day, or as directed by your caregiver.  Use saline nasal sprays to help moisten and clean your sinuses.  Take over-the-counter or prescription medicines for pain, discomfort, or fever only as directed by your caregiver. SEEK IMMEDIATE MEDICAL CARE IF:  You have increasing pain or severe headaches.  You have nausea, vomiting, or  drowsiness.  You have swelling around your face.  You have vision problems.  You have a stiff neck.  You have difficulty breathing. MAKE SURE YOU:   Understand these instructions.  Will watch your condition.  Will get help right away if you are not doing well or get worse. Document Released: 10/07/2005 Document Revised: 12/30/2011 Document Reviewed: 10/22/2011 [facility] Patient Information 2014 [place], [place].

## 2013-12-09 NOTE — Progress Notes (Signed)
Patient ID: Grace Ferrell    DOB: 08/16/1966, 48 y.o.   MRN: [id number] --- Subjective:  Grace Ferrell is a 48 y.o.female who presents for follow up on acute on chronic sinusitis.  She took 8 days of augmentin and felt better so did not complete the full antibiotic course. She then started having worsening frontal headache, pressure like and nagging, constant headache that is better with laying down. Not associated with change in vision, nausea or vomiting. Has been taking ibuprofen for pain relief. She reports improvement of cough since the last time she was evaluated. She continues to have a dry cough. She continues to have greenish rhinorrhea, nasal congestion. She also reports loss of sense of smell for several years.  She has had chronic sinusitis and chronic sinus headaches for several years. She was seen by Dr. Annalee Genta with ENT but was not able to follow up after loosing health insurance.    ROS: see HPI Past Medical History: reviewed and updated medications and allergies. Social History: Tobacco: current smoker  Objective: Filed Vitals:   12/08/13 1402  BP: 150/84  Pulse: 91  Temp: 98.7 F (37.1 C)    Physical Examination:   General appearance - alert, well appearing, and in no distress Ears - dull TM's bilaterally, no effusion or bulging membrane Nose - erythematous and congested nasal turbinates bilaterally, no maxillary sinus tenderness Mouth - cobblestone pattern of posterior oropharynx, no tonsilar exudates Chest - clear to auscultation, no wheezes, rales or rhonchi, symmetric air entry Heart - normal rate, regular rhythm, normal S1, S2, no murmurs Neuro - CN2-12 grossly intact

## 2013-12-09 NOTE — Assessment & Plan Note (Signed)
Rebound after stopping antibiotics.  - will extend augmentin course to another 3 weeks.  - will hold off on prednisone treatment given patient's elevated glucose.  - start steroid nasal spray - continue ipratropium spray - continue nasal saline - if not improved with current treatment, would like for her to be evaluated by ENT

## 2013-12-28 ENCOUNTER — Telehealth: Payer: Self-pay | Admitting: Family Medicine

## 2013-12-28 DIAGNOSIS — S025XXA Fracture of tooth (traumatic), initial encounter for closed fracture: Secondary | ICD-10-CM

## 2013-12-28 NOTE — Telephone Encounter (Signed)
Need a referral to the Dental Clinic for a broken tooth that need extracted on upper right side.  Please contact as soon as appt made.

## 2013-12-28 NOTE — Telephone Encounter (Signed)
Called pt. She has orange card. Explained, that the dental clinic will call her once we have the referral from her PCP.  Fwd. To PCP to put in the referral for the Dental Clinic. Lorenda Hatchet, Renato Battles

## 2013-12-28 NOTE — Telephone Encounter (Signed)
I have placed dental referral. Please let patient know that we will contact her when the referral goes through.  Thank you!  Marena Chancy, PGY-3 Family Medicine Resident

## 2014-01-24 ENCOUNTER — Other Ambulatory Visit: Payer: No Typology Code available for payment source

## 2014-02-04 ENCOUNTER — Telehealth: Payer: Self-pay | Admitting: Family Medicine

## 2014-02-04 NOTE — Telephone Encounter (Signed)
Pt called and would like to have the doctor to order her some labs. jw

## 2014-02-04 NOTE — Telephone Encounter (Signed)
Called pt.

## 2014-02-04 NOTE — Telephone Encounter (Signed)
Called pt. LMVM to call back. (what type of labs? Pt had labs 12/01/13 lipids, c-met and a1c) waiting for call back. .Thekla Slade  

## 2014-02-07 NOTE — Telephone Encounter (Signed)
Spoke with patient and she is requesting an A1c to be drawn.  Last A1c was 11/23/2013 and I told pt that that lab is typically not drawn except for every 3-6 months and that a MD needs to order it.  I instructed pt to call back and schedule an OV with PCP.  Pt verbalized understanding.  Radene Ou, CMA

## 2014-02-21 ENCOUNTER — Encounter: Payer: Self-pay | Admitting: Family Medicine

## 2014-02-22 ENCOUNTER — Encounter: Payer: Self-pay | Admitting: Family Medicine

## 2014-02-22 ENCOUNTER — Ambulatory Visit (INDEPENDENT_AMBULATORY_CARE_PROVIDER_SITE_OTHER): Payer: No Typology Code available for payment source | Admitting: Family Medicine

## 2014-02-22 VITALS — BP 140/80 | HR 80 | Temp 98.1°F | Ht 62.5 in | Wt 164.0 lb

## 2014-02-22 DIAGNOSIS — E119 Type 2 diabetes mellitus without complications: Secondary | ICD-10-CM

## 2014-02-22 DIAGNOSIS — E785 Hyperlipidemia, unspecified: Secondary | ICD-10-CM

## 2014-02-22 DIAGNOSIS — I10 Essential (primary) hypertension: Secondary | ICD-10-CM

## 2014-02-22 LAB — POCT GLYCOSYLATED HEMOGLOBIN (HGB A1C): Hemoglobin A1C: 7.2

## 2014-02-22 MED ORDER — ATORVASTATIN CALCIUM 40 MG PO TABS: 40.0000 mg | ORAL_TABLET | Freq: Every day | ORAL | Status: DC

## 2014-02-22 MED ORDER — GLIMEPIRIDE 4 MG PO TABS: 4.0000 mg | ORAL_TABLET | Freq: Every day | ORAL | Status: DC

## 2014-02-22 MED ORDER — METFORMIN HCL 1000 MG PO TABS: 1000.0000 mg | ORAL_TABLET | Freq: Two times a day (BID) | ORAL | Status: DC

## 2014-02-22 NOTE — Assessment & Plan Note (Signed)
Continue enalapril and hctz.

## 2014-02-22 NOTE — Assessment & Plan Note (Signed)
not on statin due to finances. Gave her info on MAP and Rx for lipitor. Once patient is on statin for 3-4 months, recheck lipid panel and LFT's.

## 2014-02-22 NOTE — Assessment & Plan Note (Addendum)
A1C of 7.2 today from 7.0. Patient compliant with amaryl 4mg  and metformin 1000mg  bid.  I hesitate to increase her amaryl at this time, since she tells me that she has episodes of possible hypoglycemia.  Additionally, she is under increased stress from having lost her job and not being able to afford food and bills.  - patient only eats one meal per day and I recommended that she increase the frequency of meals to 3 times per day and decrease fried foods.  - encouraged continued exercise.  - eye exam: up to date - on ACEI - foot exam normal - not taking statin due to finances. Gave her info on MAP and Rx for lipitor. Once patient is on statin for 3-4 months, recheck lipid panel and LFT's.

## 2014-02-22 NOTE — Progress Notes (Signed)
Patient ID: ARNETA MAGO    DOB: 18-Jun-1966, 48 y.o.   MRN: [id number] --- Subjective:  Grace Ferrell is a 48 y.o.female who presents for follow up on HTN and DM2.  She reports being very stressed due to the loss of her job and having difficulty paying bills.   # Hypertension: Medications: enalapril 10 and hctz 25 Number of doses missed in 1 week: 0 BP at home: doesn't check Exercise routine: walking every day, 30 minutes.  Salt in diet: some Chest pain: no Lower extremity swelling: none Shortness of breath: none Headache: yes, but sinus related Change in vision: no   # Diabetes: - medications: metformin 100mg  bid, amaryl 4mg  daily - missed doses in 1 week: 0 - hypoglycemic events: sick on stomach at times with nausea, better with hard candy.  - CBG's at home: doesn't check - frequency of monitoring: doesn't check - changes in vision? Seen by optho, no glaucoma, no diabetic retinopathy - numbness or tingling in lower extremities? none - sores or ulcers? none - seen by podiatry? no - last eye check? April 2015 - diet: stayig away from processed foods. Increased vegetables. Diet sodas and water - exercise: walking every day, 30 minutes.    ROS: see HPI Past Medical History: reviewed and updated medications and allergies. Social History: Tobacco: 1/2 pack per day  Objective: Filed Vitals:   02/22/14 0859  BP: 93/60  Pulse: 80  Temp: 98.1 F (36.7 C)    Physical Examination:   General appearance - alert, well appearing, and in no distress Chest - clear to auscultation, no wheezes, rales or rhonchi, symmetric air entry Heart - normal rate, regular rhythm, normal S1, S2, no murmurs Extremities - peripheral pulses normal, no pedal edema, normal microfilament testing bilaterally, no caluses or ulcers, normal nails.

## 2014-02-22 NOTE — Patient Instructions (Signed)
For the diabetes, let's continue with the current medicines. We will do the same for the blood pressure.   Try and get the cholesterol medicine at the patient assistance program.   Follow up in 3 months.

## 2014-07-19 ENCOUNTER — Ambulatory Visit: Payer: Self-pay

## 2014-07-26 ENCOUNTER — Ambulatory Visit: Payer: Self-pay

## 2014-07-29 ENCOUNTER — Other Ambulatory Visit: Payer: Self-pay | Admitting: *Deleted

## 2014-07-29 ENCOUNTER — Other Ambulatory Visit: Payer: Self-pay | Admitting: Family Medicine

## 2014-07-29 DIAGNOSIS — E119 Type 2 diabetes mellitus without complications: Secondary | ICD-10-CM

## 2014-07-29 MED ORDER — METFORMIN HCL 1000 MG PO TABS: 1000.0000 mg | ORAL_TABLET | Freq: Two times a day (BID) | ORAL | Status: DC

## 2014-07-29 NOTE — Telephone Encounter (Signed)
Pt asking for one refill on metformin, has appt for next month, pt will have pharmacy send refill request

## 2014-07-29 NOTE — Telephone Encounter (Signed)
Rx refilled.  Thanks, Joanna Puff, MD [facility] Family Medicine Resident

## 2014-07-30 MED ORDER — METFORMIN HCL 1000 MG PO TABS: 1000.0000 mg | ORAL_TABLET | Freq: Two times a day (BID) | ORAL | Status: DC

## 2014-07-30 NOTE — Telephone Encounter (Signed)
Rx refilled.  Thanks, Joanna Puff, MD [facility] Family Medicine Resident  07/30/2014, 4:24 PM

## 2014-08-28 ENCOUNTER — Emergency Department (HOSPITAL_COMMUNITY): Payer: Self-pay

## 2014-08-28 ENCOUNTER — Encounter (HOSPITAL_COMMUNITY): Payer: Self-pay | Admitting: Emergency Medicine

## 2014-08-28 ENCOUNTER — Inpatient Hospital Stay (HOSPITAL_COMMUNITY)
Admission: EM | Admit: 2014-08-28 | Discharge: 2014-08-29 | DRG: 293 | Disposition: A | Discharge status: Home / Self Care | Payer: Self-pay | Attending: Family Medicine | Admitting: Family Medicine

## 2014-08-28 DIAGNOSIS — R0781 Pleurodynia: Secondary | ICD-10-CM

## 2014-08-28 DIAGNOSIS — F17210 Nicotine dependence, cigarettes, uncomplicated: Secondary | ICD-10-CM | POA: Diagnosis present

## 2014-08-28 DIAGNOSIS — Z885 Allergy status to narcotic agent status: Secondary | ICD-10-CM

## 2014-08-28 DIAGNOSIS — Z79899 Other long term (current) drug therapy: Secondary | ICD-10-CM

## 2014-08-28 DIAGNOSIS — Z9114 Patient's other noncompliance with medication regimen: Secondary | ICD-10-CM | POA: Diagnosis present

## 2014-08-28 DIAGNOSIS — J329 Chronic sinusitis, unspecified: Secondary | ICD-10-CM | POA: Diagnosis present

## 2014-08-28 DIAGNOSIS — I5021 Acute systolic (congestive) heart failure: Principal | ICD-10-CM | POA: Diagnosis present

## 2014-08-28 DIAGNOSIS — R059 Cough, unspecified: Secondary | ICD-10-CM

## 2014-08-28 DIAGNOSIS — R0902 Hypoxemia: Secondary | ICD-10-CM | POA: Diagnosis present

## 2014-08-28 DIAGNOSIS — F329 Major depressive disorder, single episode, unspecified: Secondary | ICD-10-CM | POA: Diagnosis present

## 2014-08-28 DIAGNOSIS — G894 Chronic pain syndrome: Secondary | ICD-10-CM | POA: Diagnosis present

## 2014-08-28 DIAGNOSIS — I509 Heart failure, unspecified: Secondary | ICD-10-CM

## 2014-08-28 DIAGNOSIS — R05 Cough: Secondary | ICD-10-CM | POA: Insufficient documentation

## 2014-08-28 DIAGNOSIS — I10 Essential (primary) hypertension: Secondary | ICD-10-CM | POA: Insufficient documentation

## 2014-08-28 DIAGNOSIS — R0602 Shortness of breath: Secondary | ICD-10-CM

## 2014-08-28 DIAGNOSIS — R0600 Dyspnea, unspecified: Secondary | ICD-10-CM

## 2014-08-28 DIAGNOSIS — J309 Allergic rhinitis, unspecified: Secondary | ICD-10-CM | POA: Diagnosis present

## 2014-08-28 DIAGNOSIS — E049 Nontoxic goiter, unspecified: Secondary | ICD-10-CM | POA: Diagnosis present

## 2014-08-28 DIAGNOSIS — Z882 Allergy status to sulfonamides status: Secondary | ICD-10-CM

## 2014-08-28 DIAGNOSIS — E780 Pure hypercholesterolemia: Secondary | ICD-10-CM | POA: Diagnosis present

## 2014-08-28 DIAGNOSIS — E119 Type 2 diabetes mellitus without complications: Secondary | ICD-10-CM | POA: Insufficient documentation

## 2014-08-28 DIAGNOSIS — E785 Hyperlipidemia, unspecified: Secondary | ICD-10-CM | POA: Diagnosis present

## 2014-08-28 DIAGNOSIS — I5022 Chronic systolic (congestive) heart failure: Secondary | ICD-10-CM

## 2014-08-28 DIAGNOSIS — Z66 Do not resuscitate: Secondary | ICD-10-CM | POA: Diagnosis present

## 2014-08-28 DIAGNOSIS — Z7982 Long term (current) use of aspirin: Secondary | ICD-10-CM

## 2014-08-28 DIAGNOSIS — R079 Chest pain, unspecified: Secondary | ICD-10-CM

## 2014-08-28 LAB — I-STAT TROPONIN, ED: Troponin i, poc: 0.01 ng/mL (ref 0.00–0.08)

## 2014-08-28 LAB — BASIC METABOLIC PANEL
ANION GAP: 13 (ref 5–15)
BUN: 9 mg/dL (ref 6–23)
CALCIUM: 9.7 mg/dL (ref 8.4–10.5)
CO2: 24 mEq/L (ref 19–32)
CREATININE: 0.71 mg/dL (ref 0.50–1.10)
Chloride: 105 mEq/L (ref 96–112)
GFR calc Af Amer: >90 mL/min (ref >90)
Glucose, Bld: 152 mg/dL — ABNORMAL HIGH (ref 70–99)
Potassium: 4.3 mEq/L (ref 3.7–5.3)
SODIUM: 142 meq/L (ref 137–147)

## 2014-08-28 LAB — CBC
HCT: 36.1 % (ref 36.0–46.0)
HCT: 33.7 % — ABNORMAL LOW (ref 36.0–46.0)
Hemoglobin: 11.9 g/dL — ABNORMAL LOW (ref 12.0–15.0)
Hemoglobin: 11.1 g/dL — ABNORMAL LOW (ref 12.0–15.0)
MCH: 26.5 pg (ref 26.0–34.0)
MCH: 26.1 pg (ref 26.0–34.0)
MCHC: 33 g/dL (ref 30.0–36.0)
MCHC: 32.9 g/dL (ref 30.0–36.0)
MCV: 80.4 fL (ref 78.0–100.0)
MCV: 79.1 fL (ref 78.0–100.0)
PLATELETS: 268 10*3/uL (ref 150–400)
Platelets: 221 10*3/uL (ref 150–400)
RBC: 4.49 MIL/uL (ref 3.87–5.11)
RBC: 4.26 MIL/uL (ref 3.87–5.11)
RDW: 15.9 % — AB (ref 11.5–15.5)
RDW: 16 % — AB (ref 11.5–15.5)
WBC: 6.9 10*3/uL (ref 4.0–10.5)
WBC: 7.2 10*3/uL (ref 4.0–10.5)

## 2014-08-28 LAB — CREATININE, SERUM
CREATININE: 0.9 mg/dL (ref 0.50–1.10)
GFR calc Af Amer: 85 mL/min — ABNORMAL LOW (ref >90)
GFR, EST NON AFRICAN AMERICAN: 73 mL/min — AB (ref >90)

## 2014-08-28 LAB — PRO B NATRIURETIC PEPTIDE: PRO B NATRI PEPTIDE: 1700 pg/mL — AB (ref 0–125)

## 2014-08-28 LAB — GLUCOSE, CAPILLARY: GLUCOSE-CAPILLARY: 171 mg/dL — AB (ref 70–99)

## 2014-08-28 LAB — CBG MONITORING, ED: Glucose-Capillary: 214 mg/dL — ABNORMAL HIGH (ref 70–99)

## 2014-08-28 LAB — TROPONIN I: Troponin I: <0.3 ng/mL (ref <0.30)

## 2014-08-28 LAB — D-DIMER, QUANTITATIVE (NOT AT ARMC): D-Dimer, Quant: 0.75 ug/mL-FEU — ABNORMAL HIGH (ref 0.00–0.48)

## 2014-08-28 IMAGING — CR DG CHEST 2V
2 series · 2 of 2 positions shown · non-contrast
Comparison: [DATE]; [DATE]

CLINICAL DATA: Cough and shortness of breath for 2 weeks. No fever.
History of diabetes and hypertension.

EXAM:
CHEST  2 VIEW

[w chest pa]
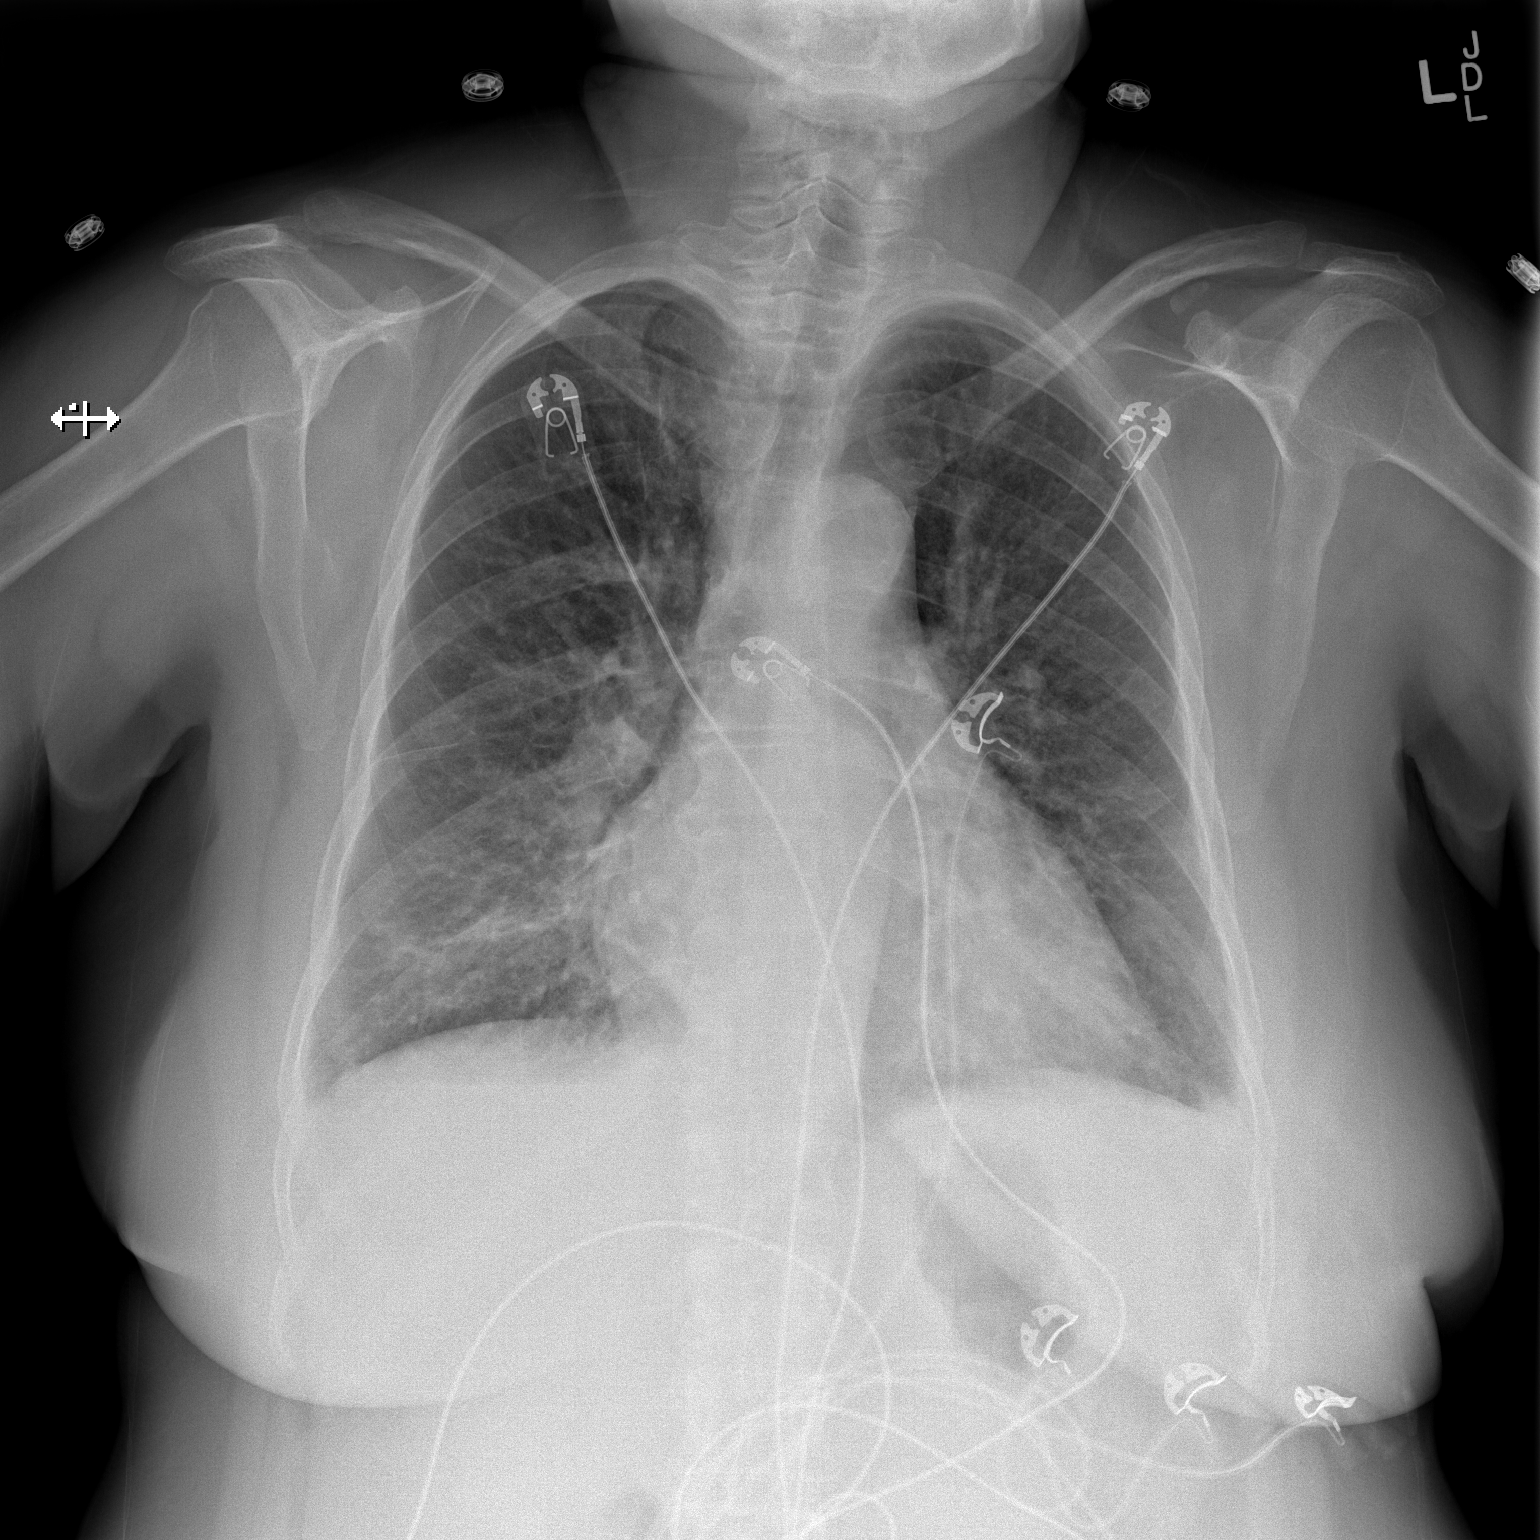

[w chest lat]
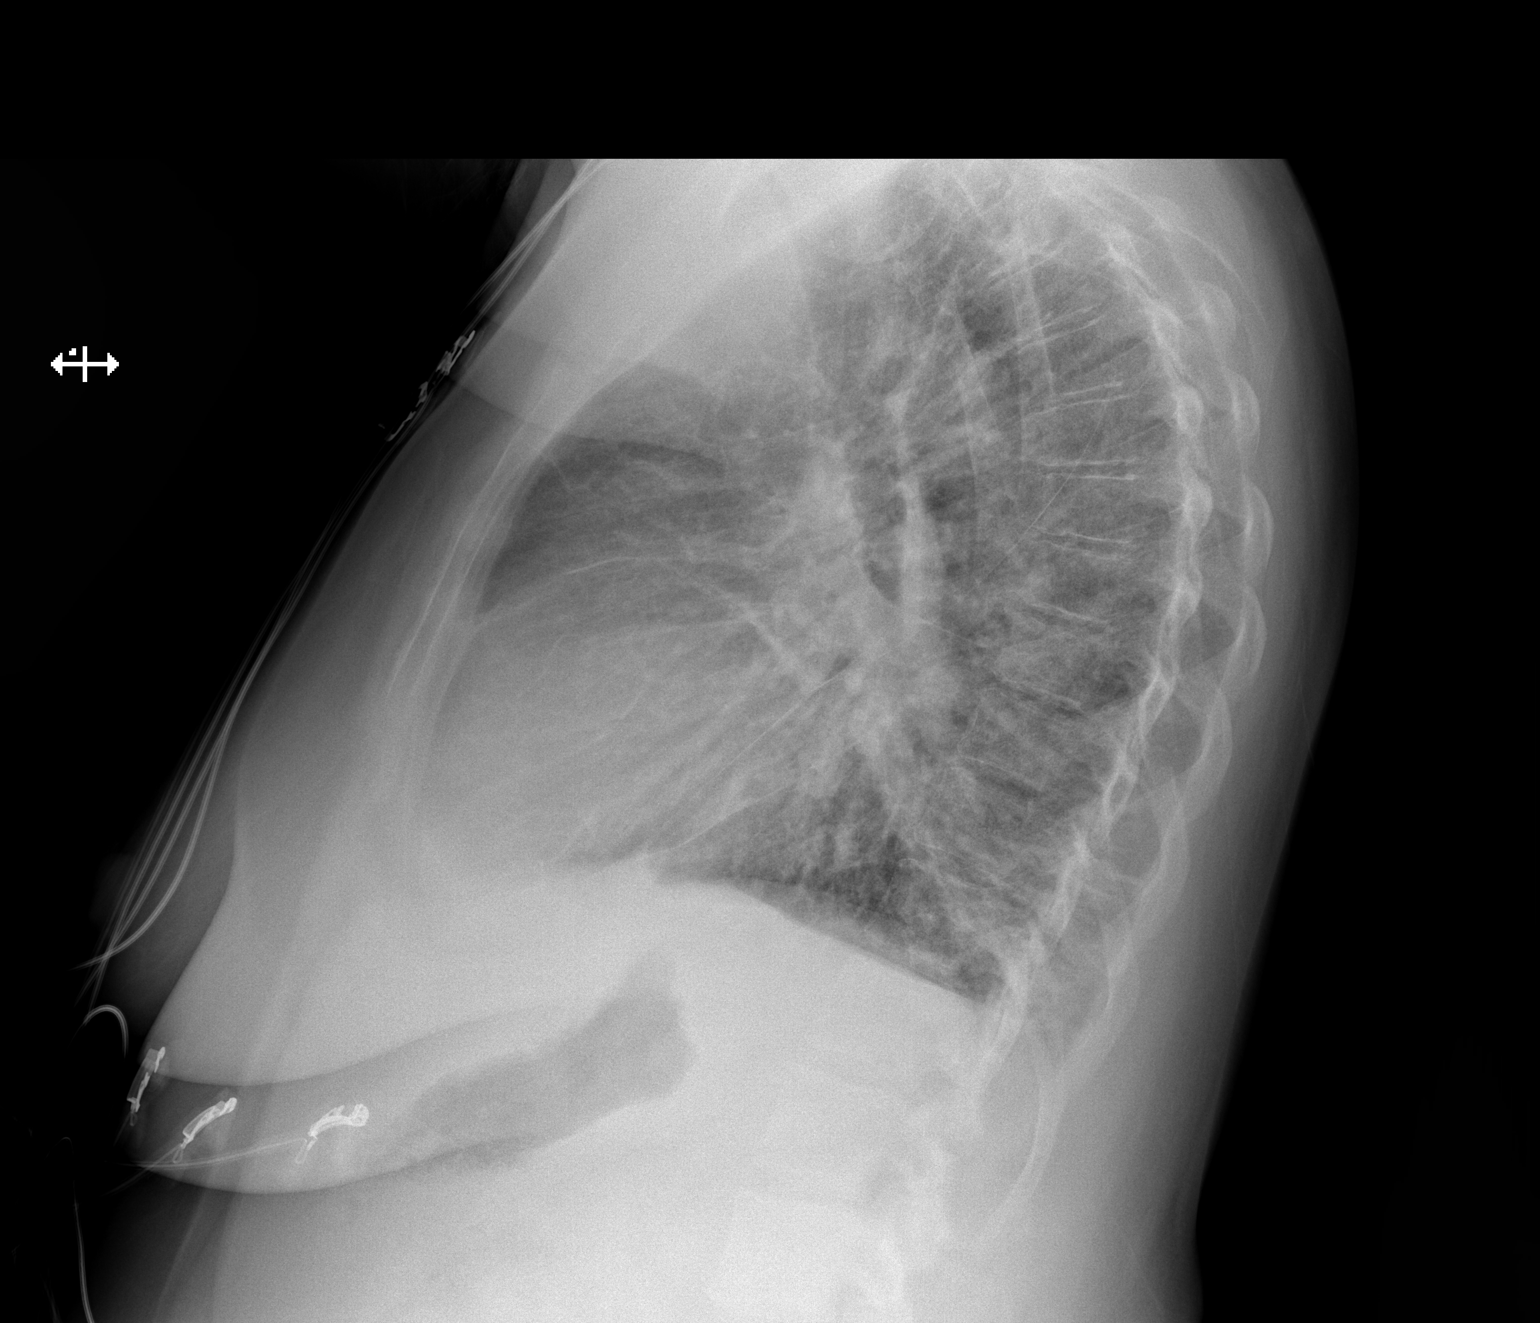

[2 of 2 positions shown; findings below may reference images not displayed]

FINDINGS: Apparent mild enlargement of the cardiac silhouette. The pulmonary
vasculature appears less distinct the present examination with
cephalization of flow. Minimal pleural parenchymal thickening about
the right minor and bilateral major fissures. Interval development
trace bilateral effusions with associated worsening bibasilar
heterogeneous opacities. Unchanged bones.
IMPRESSION: 1. Suspected mild/early pulmonary edema with trace bilateral
effusions and associated bibasilar opacities, likely atelectasis.
2. Apparent minimal enlargement of the cardiac silhouette, possibly
accentuated due to inspiratory effort though could be seen in the
setting of a pericardial effusion. Further evaluation with cardiac
echo could be performed as clinically indicated.

## 2014-08-28 IMAGING — CT CT ANGIO CHEST
1 of 2 series · 19 of 32 positions shown · IV contrast (OMNIPAQUE 300)
Comparison: CT [DATE]

CLINICAL DATA: Mid sternal chest pain for 3 weeks, cough, pleuritic
chest pain

EXAM:
CT ANGIOGRAPHY CHEST WITH CONTRAST
TECHNIQUE: Multidetector CT imaging of the chest was performed using the
standard protocol during bolus administration of intravenous
contrast. Multiplanar CT image reconstructions and MIPs were
obtained to evaluate the vascular anatomy.
CONTRAST:  100mL OMNIPAQUE IOHEXOL 350 MG/ML SOLN

[Series 6: thins for pacs · axial · 0.67mm/px · z∈[-258,-29]mm · 19 of 251 slices shown]
[im 11/251  lung]
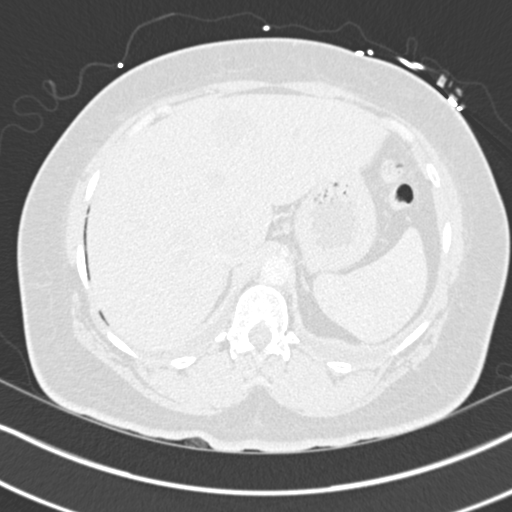
[im 22/251  soft-tissue]
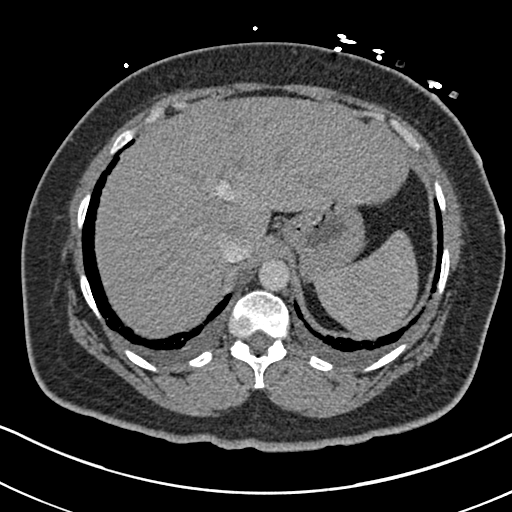
[im 33/251  lung]
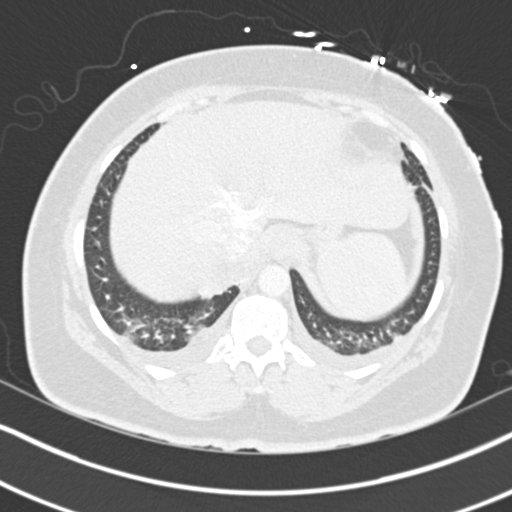
[im 55/251  soft-tissue]
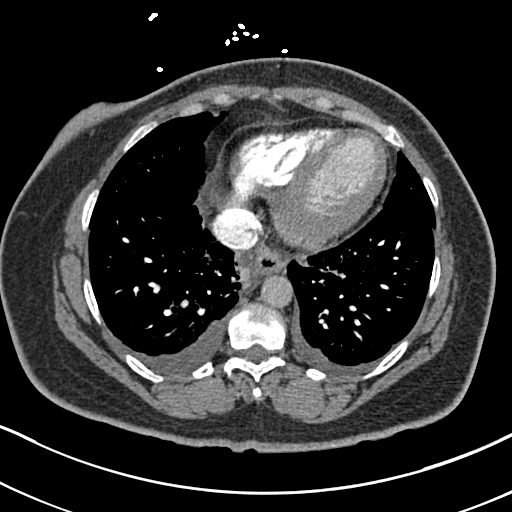
[im 66/251  lung]
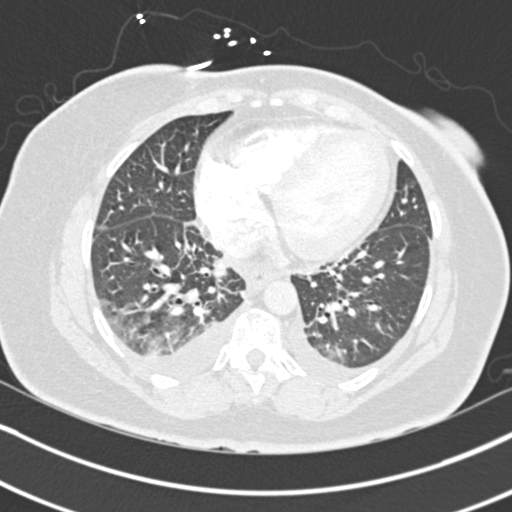
[im 77/251  soft-tissue]
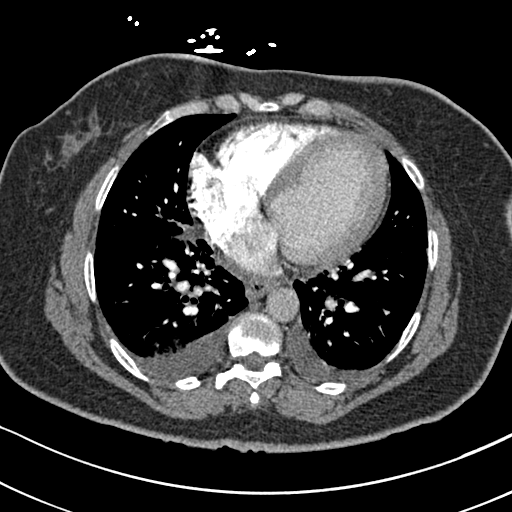
[im 87/251  lung]
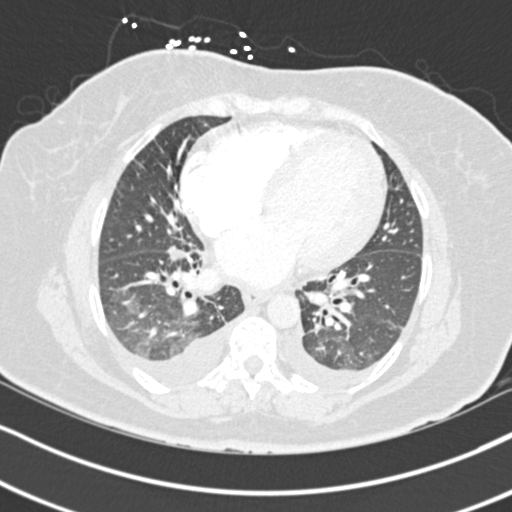
[im 98/251  soft-tissue]
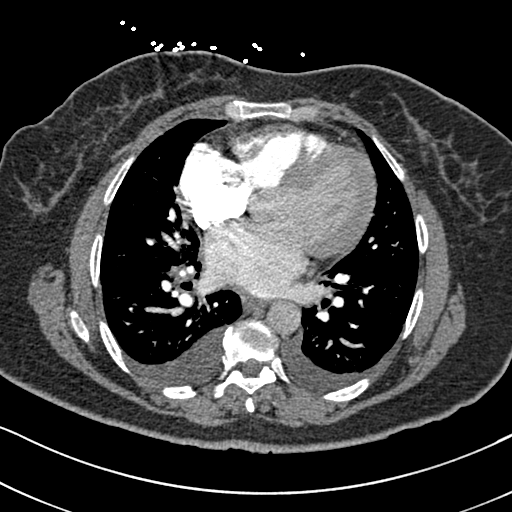
[im 109/251  lung]
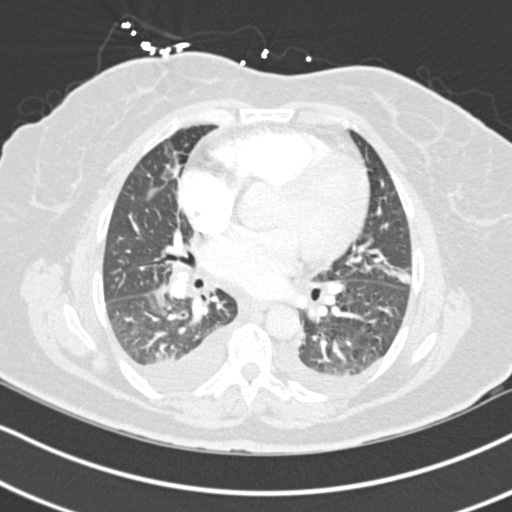
[im 131/251  soft-tissue]
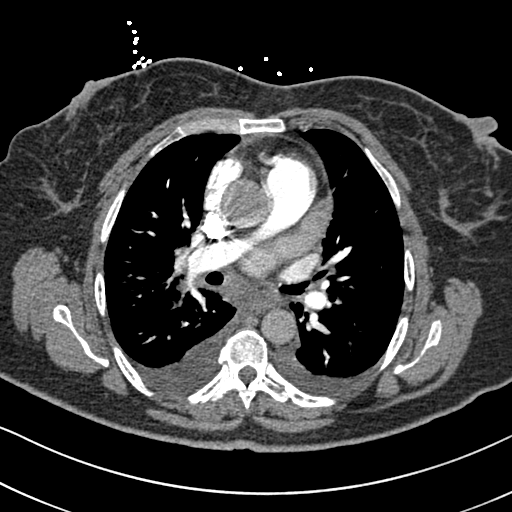
[im 142/251  lung]
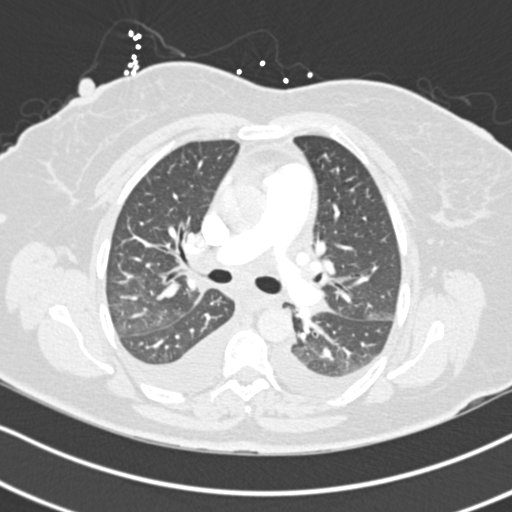
[im 153/251  soft-tissue]
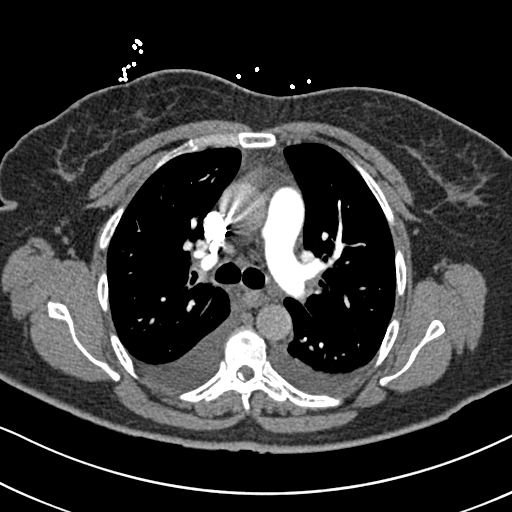
[im 164/251  lung]
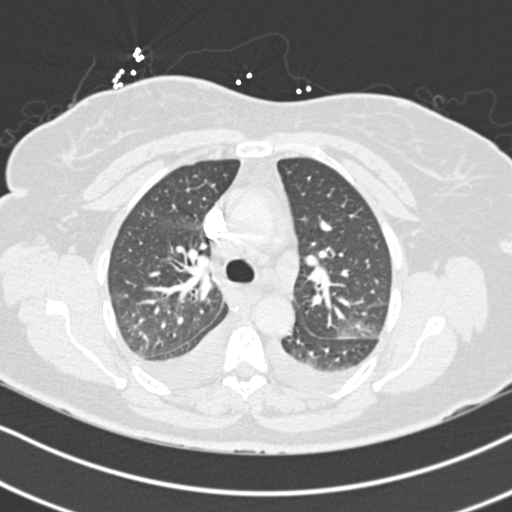
[im 174/251  soft-tissue]
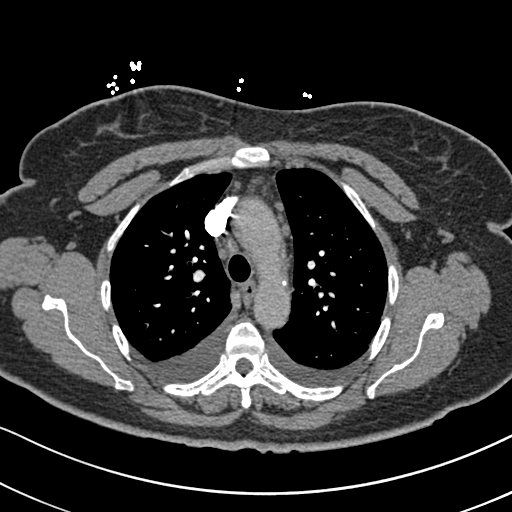
[im 185/251  lung]
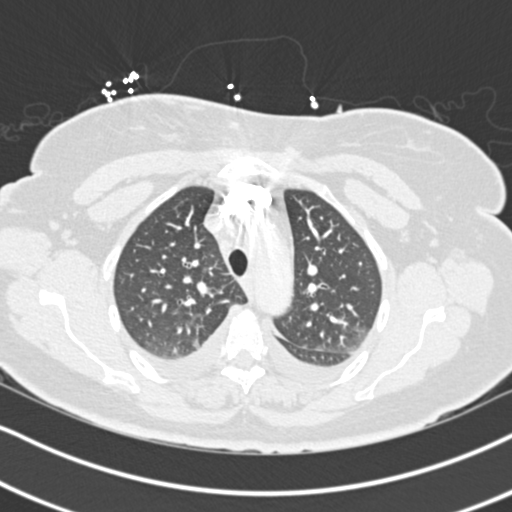
[im 196/251  soft-tissue]
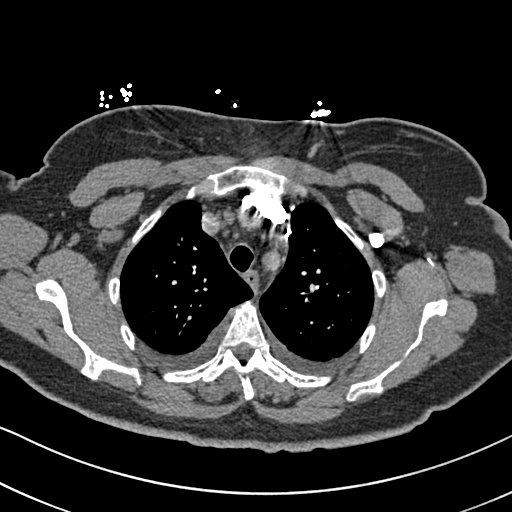
[im 218/251  lung]
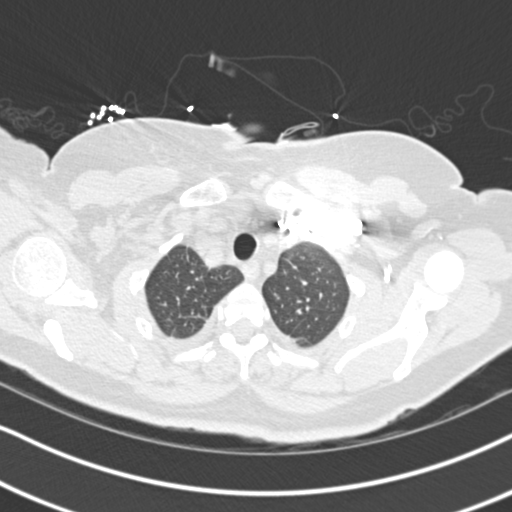
[im 229/251  soft-tissue]
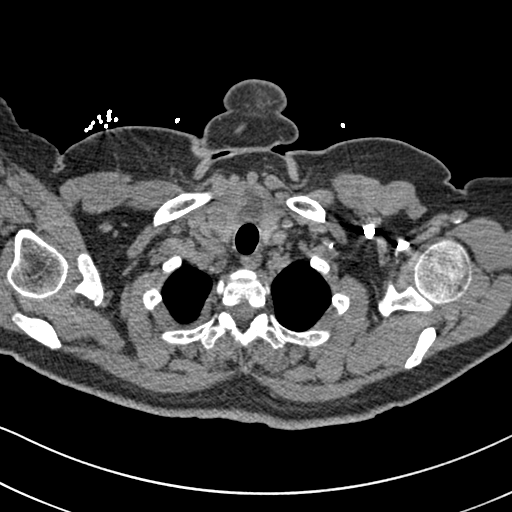
[im 240/251  lung]
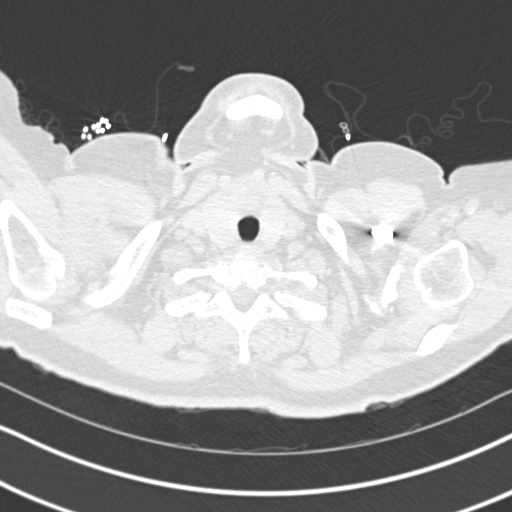

[19 of 32 positions shown; findings below may reference images not displayed]

FINDINGS: The examination is adequate for evaluation for acute pulmonary
embolism up to and including the 3rd order pulmonary arteries. No
focal filling defect is identified up to and including the 3rd order
pulmonary arteries to suggest acute pulmonary embolism.

Small pleural effusions are noted. Prominence of mediastinal and
hilar nodes is identified with representative 1 cm pretracheal node
identified image 30 and right hilar node 0.7 cm image 47. Great
vessels are normal in caliber. Hypodense 2.3 cm isthmus thyroid
nodule reidentified image 6. Mild cardiomegaly noted. Trace
pericardial fluid.

Minimal subpleural scarring or atelectasis. A few areas of patchy
ground-glass airspace opacity at the lung bases are noted. Mild
central bronchial wall thickening.

No acute osseous abnormality.

At incomplete imaging of the upper abdomen, again noted is an
ill-defined 2.8 cm left hepatic lobe lesion image 83. This is
incompletely characterized but stable since [VT] and therefore
highly likely to be benign.

Review of the MIP images confirms the above findings.
IMPRESSION: Pleural effusions and areas of ground-glass airspace opacity and
bronchial wall thickening likely indicating mild pulmonary edema.
Probable reactive lymph nodes in the mediastinum and hilum.

No CT evidence for acute pulmonary embolism.

## 2014-08-28 MED ORDER — ASPIRIN 325 MG PO TABS
325.0000 mg | ORAL_TABLET | Freq: Once | ORAL | Status: AC
  Administered 2014-08-28: 325 mg via ORAL
  Filled 2014-08-28: qty 1

## 2014-08-28 MED ORDER — FUROSEMIDE 10 MG/ML IJ SOLN
40.0000 mg | Freq: Once | INTRAMUSCULAR | Status: AC
  Administered 2014-08-28: 40 mg via INTRAVENOUS
  Filled 2014-08-28: qty 4

## 2014-08-28 MED ORDER — BENZONATATE 100 MG PO CAPS: 100.0000 mg | ORAL_CAPSULE | Freq: Two times a day (BID) | ORAL | Status: DC | PRN

## 2014-08-28 MED ORDER — MORPHINE SULFATE 4 MG/ML IJ SOLN
4.0000 mg | Freq: Once | INTRAMUSCULAR | Status: DC
  Filled 2014-08-28: qty 1

## 2014-08-28 MED ORDER — FENTANYL CITRATE 0.05 MG/ML IJ SOLN
25.0000 ug | INTRAMUSCULAR | Status: DC | PRN
  Administered 2014-08-29: 25 ug via INTRAVENOUS
  Filled 2014-08-28: qty 2

## 2014-08-28 MED ORDER — FUROSEMIDE 10 MG/ML IJ SOLN
20.0000 mg | Freq: Once | INTRAMUSCULAR | Status: AC
  Administered 2014-08-28: 20 mg via INTRAVENOUS
  Filled 2014-08-28: qty 4

## 2014-08-28 MED ORDER — BENZONATATE 100 MG PO CAPS
100.0000 mg | ORAL_CAPSULE | Freq: Once | ORAL | Status: AC
  Administered 2014-08-28: 100 mg via ORAL
  Filled 2014-08-28: qty 1

## 2014-08-28 MED ORDER — HEPARIN SODIUM (PORCINE) 5000 UNIT/ML IJ SOLN
5000.0000 [IU] | Freq: Three times a day (TID) | INTRAMUSCULAR | Status: DC
  Administered 2014-08-28 – 2014-08-29 (×4): 5000 [IU] via SUBCUTANEOUS
  Filled 2014-08-28 (×4): qty 1

## 2014-08-28 MED ORDER — IOHEXOL 350 MG/ML SOLN
100.0000 mL | Freq: Once | INTRAVENOUS | Status: AC | PRN
  Administered 2014-08-28: 100 mL via INTRAVENOUS

## 2014-08-28 MED ORDER — ALBUTEROL SULFATE HFA 108 (90 BASE) MCG/ACT IN AERS: 2.0000 | INHALATION_SPRAY | RESPIRATORY_TRACT | Status: DC | PRN

## 2014-08-28 MED ORDER — ASPIRIN 81 MG PO TBEC: 81.0000 mg | DELAYED_RELEASE_TABLET | Freq: Every day | ORAL | Status: DC

## 2014-08-28 MED ORDER — FENTANYL CITRATE 0.05 MG/ML IJ SOLN
50.0000 ug | Freq: Once | INTRAMUSCULAR | Status: AC
  Administered 2014-08-28: 50 ug via INTRAVENOUS
  Filled 2014-08-28: qty 2

## 2014-08-28 MED ORDER — LOSARTAN POTASSIUM 25 MG PO TABS
25.0000 mg | ORAL_TABLET | Freq: Every day | ORAL | Status: DC
  Administered 2014-08-28 – 2014-08-29 (×2): 25 mg via ORAL
  Filled 2014-08-28 (×2): qty 1

## 2014-08-28 MED ORDER — ALBUTEROL SULFATE (2.5 MG/3ML) 0.083% IN NEBU: 2.5000 mg | INHALATION_SOLUTION | RESPIRATORY_TRACT | Status: DC | PRN

## 2014-08-28 MED ORDER — ALBUTEROL SULFATE HFA 108 (90 BASE) MCG/ACT IN AERS
4.0000 | INHALATION_SPRAY | Freq: Once | RESPIRATORY_TRACT | Status: AC
  Administered 2014-08-28: 4 via RESPIRATORY_TRACT
  Filled 2014-08-28: qty 6.7

## 2014-08-28 MED ORDER — ATORVASTATIN CALCIUM 40 MG PO TABS
40.0000 mg | ORAL_TABLET | Freq: Every day | ORAL | Status: DC
  Administered 2014-08-28: 40 mg via ORAL
  Filled 2014-08-28 (×2): qty 1

## 2014-08-28 MED ORDER — IPRATROPIUM BROMIDE 0.06 % NA SOLN
2.0000 | Freq: Four times a day (QID) | NASAL | Status: DC
  Filled 2014-08-28: qty 15

## 2014-08-28 MED ORDER — HYDROCHLOROTHIAZIDE 25 MG PO TABS
25.0000 mg | ORAL_TABLET | Freq: Every day | ORAL | Status: DC
  Administered 2014-08-29: 25 mg via ORAL
  Filled 2014-08-28: qty 1

## 2014-08-28 MED ORDER — GUAIFENESIN-DM 100-10 MG/5ML PO SYRP
5.0000 mL | ORAL_SOLUTION | ORAL | Status: DC | PRN
  Administered 2014-08-29: 5 mL via ORAL
  Filled 2014-08-28: qty 5

## 2014-08-28 MED ORDER — ASPIRIN EC 81 MG PO TBEC
81.0000 mg | DELAYED_RELEASE_TABLET | Freq: Every day | ORAL | Status: DC
  Administered 2014-08-29: 81 mg via ORAL
  Filled 2014-08-28: qty 1

## 2014-08-28 MED ORDER — SODIUM CHLORIDE 0.9 % IJ SOLN
3.0000 mL | Freq: Two times a day (BID) | INTRAMUSCULAR | Status: DC
  Administered 2014-08-28 – 2014-08-29 (×2): 3 mL via INTRAVENOUS

## 2014-08-28 MED ORDER — FENTANYL CITRATE 0.05 MG/ML IJ SOLN
50.0000 ug | Freq: Once | INTRAMUSCULAR | Status: AC
Start: 2014-08-28 — End: 2014-08-28
  Administered 2014-08-28: 50 ug via INTRAVENOUS
  Filled 2014-08-28: qty 2

## 2014-08-28 MED ORDER — INSULIN ASPART 100 UNIT/ML ~~LOC~~ SOLN
0.0000 [IU] | Freq: Three times a day (TID) | SUBCUTANEOUS | Status: DC
  Administered 2014-08-28 – 2014-08-29 (×2): 3 [IU] via SUBCUTANEOUS

## 2014-08-28 MED ORDER — FUROSEMIDE 20 MG PO TABS: 20.0000 mg | ORAL_TABLET | Freq: Every morning | ORAL | Status: DC

## 2014-08-28 MED ORDER — IBUPROFEN 200 MG PO TABS
400.0000 mg | ORAL_TABLET | Freq: Once | ORAL | Status: AC
  Administered 2014-08-28: 400 mg via ORAL
  Filled 2014-08-28: qty 2

## 2014-08-28 NOTE — ED Notes (Signed)
Patient transported to CT 

## 2014-08-28 NOTE — ED Notes (Signed)
Pt c/o a headache throbbing in character.

## 2014-08-28 NOTE — H&P (Signed)
Family Medicine Teaching [facility] Admission History and Physical Service Pager: [phone]  Patient name: Grace Ferrell Medical record number: [id number] Date of birth: 07/15/1966 Age: 48 y.o. Gender: female  Primary Care Provider: Joanna Pufforsey, Crystal S, MD Consultants: None Code Status: DNR  Chief Complaint: Shortness of Breath  Assessment and Plan: Grace Ferrell is a 48 y.o. female presenting with worsening shortness of breath, edema, and cough x2-3 weeks. She ran out of HTN medications approximately one month ago. PMH is significant for HTN, diabetes, and hypercholesterolemia.  # Shortness of Breath: Suspected to be secondary to new onset CHF.  Shortness of breath worsening over two weeks with increased edema. DDx includes pneumonia (afebrile, WBC normal), COPD (no increased expiratory phase on exam), MI (trending troponins), PE (tachypneic, HR WNL, Wells Score 0, D-dimer 0.75, CT no evidence of PE), and changes secondary to smoking (smoked 1/2ppd x30 years). - BNP- 1700 - Troponins negative. Trending. - WBC 6.9 - D-dimer 0.75 - CT: Pleural effusions and areas of ground-glass airspace opacity and bronchial wall thickening likely indicating mild pulmonary edema. Probable reactive lymph nodes in the mediastinum and hilum. No CT evidence for acute pulmonary embolism - CXR: Suspected mild/early pulmonary edema with trace bilateral - Follow up Echocardiogram - Lasix as needed for diuresis. 20mg  given this morning and 40mg  this afternoon. Will reassess needs daily - Albuterol nebulizer PRN SOB. Atrovent. - Fentanyl for pain. NSAIDs contraindicated due to possible CHF. Allergy to Codeine, Morphine, Vicodin. - Obtain daily weights/strict i/os - pending w/up will consider cards consult - cough potentially related to ACE allergy, will switch to arb, avoid BB in possible acute CHF, consider spiro pending  # HTN- Home medications include Enalapril 10mg , HCTZ 25mg .  - Enalapril discontinued due to  cough. Initiated on Losartan 25mg . Continue HCTZ. - Range of 131-163/ 76-81 since admission  # Diabetes- Home medications include Glimepiride 4mg , Metformin 1000mg  BID - Last A1C 7.2 in 02/2014. Will recheck during hospitalization. - Moderate SSI  # Hypercholesterolemia- Lipid panel in 11/2013 showed Cholesterol 258, TG 287, HDL 37, and LDL 164. ASCVD Risk of 53.9% (Female, 48yo, African American, TC 258, HDL 153, HTN, DM, Smoker). - Home medications include Aspirin 81mg , Atorvastatin 40mg . Will continue. Consider increasing dose of Atorvastatin as tolerated. -question of whether she was on home ator?  # Allergies- Home medications include Ranitidine 150mg  BID  FEN/GI: Heart Healthy/Carb Modified. NPO after midnight Prophylaxis: Subcutaneous Heparin  Disposition: Admitted to Family Medicine Teaching Service  History of Present Illness: Grace Ferrell is a 48 y.o. female who [facility] with SOB and substernal pain. [facility] by her team of primary physicians. She reports that her shortness of breath has been worsening for the last several weeks. She can only walk ten steps without getting shortness of breath, while prior to this episode she could walk a flight of stairs without shortness of breath. She has been out of HCTZ 2-3 weeks. She has been coughing frequently and describes her cough as dry. Denies recent fevers or sick contacts. She has tried Robitussin without relief. Pt will use up to 2-4 pillows at night to sleep and night and believes this is secondary to her sinuses. Denies PND. Admits to chest pain x2 weeks and states that this is worse with respirations, cough, and palpation. Does not radiate and not associated with diaphoresis. Also admits to increased edema in her feet.  Of note pt smokes 1/2  ppd for the last 30 years. Family history includes mother and uncle with CHF. Denies personal history of cardiac problems.  Pt presented to  the ED for persistent SOB. D-dimer obtained 0.75. POCT troponin 0.01. CXR with pulm edema. CTA neg for PE. ProBNP 1700. S/p 20mg  lasix. Noted to be hypoxic to 88 --> improved with 2L.  Review Of Systems: Per HPI  Otherwise 12 point review of systems was performed and was unremarkable.  Patient Active Problem List   Diagnosis Date Noted  . Acute CHF 08/28/2014  . Acute sinusitis, unspecified 11/24/2013  . Dysuria 06/17/2013  . Menopause 04/11/2012  . Chronic sinusitis 04/11/2012  . HLD (hyperlipidemia) 03/12/2012  . Chronic pain syndrome 05/21/2011  . GOITER, UNSPECIFIED 01/08/2010  . TOBACCO USER 07/29/2009  . DIABETES MELLITUS II, UNCOMPLICATED 12/18/2006  . DEPRESSIVE DISORDER, NOS 12/18/2006  . HYPERTENSION, BENIGN SYSTEMIC 12/18/2006   Past Medical History: Past Medical History  Diagnosis Date  . Hypertension   . Diabetes mellitus   . Hypercholesteremia    Past Surgical History: Past Surgical History  Procedure Laterality Date  . Knee surgery    . Tubal ligation    . Endometrial ablation     Social History: History  Substance Use Topics  . Smoking status: Current Every Day Smoker -- 0.50 packs/day for 21 years    Types: Cigarettes  . Smokeless tobacco: Not on file  . Alcohol Use: No   Additional social history: Smokes 1/2ppd x30 years  Please also refer to relevant sections of EMR.  Family History: No family history on file. Allergies and Medications: Allergies  Allergen Reactions  . Codeine     itching  . Darvocet [Propoxyphene N-Acetaminophen] Nausea And Vomiting  . Morphine And Related     itching  . Sulfamethoxazole-Trimethoprim     REACTION: developed itching 03-28-09 after one dose  . Vicodin [Hydrocodone-Acetaminophen] Itching   No current facility-administered medications on file prior to encounter.   Current Outpatient Prescriptions on File Prior to Encounter  Medication Sig Dispense Refill  . aspirin 81 MG EC tablet Take 81 mg by mouth daily.       Marland Kitchen glimepiride (AMARYL) 4 MG tablet Take 1 tablet (4 mg total) by mouth daily with breakfast. 30 tablet 6  . metFORMIN (GLUCOPHAGE) 1000 MG tablet Take 1 tablet (1,000 mg total) by mouth 2 (two) times daily with a meal. 60 tablet 1  . ranitidine (ZANTAC) 150 MG capsule Take 1 capsule (150 mg total) by mouth 2 (two) times daily. 60 capsule 2  . albuterol (PROVENTIL HFA;VENTOLIN HFA) 108 (90 BASE) MCG/ACT inhaler Inhale 2 puffs into the lungs every 4 (four) hours as needed for wheezing (cough, shortness of breath or wheezing.). (Patient not taking: Reported on 08/28/2014) 1 Inhaler 1  . atorvastatin (LIPITOR) 40 MG tablet Take 1 tablet (40 mg total) by mouth daily. (Patient not taking: Reported on 08/28/2014) 30 tablet 3  . enalapril (VASOTEC) 10 MG tablet TAKE 1 TABLET BY MOUTH EVERY DAY (Patient not taking: Reported on 08/28/2014) 90 tablet 3  . hydrochlorothiazide (HYDRODIURIL) 25 MG tablet TAKE 1 TABLET BY MOUTH EVERY DAY (Patient not taking: Reported on 08/28/2014) 90 tablet 1  . ipratropium (ATROVENT) 0.06 % nasal spray Place 2 sprays into both nostrils 4 (four) times daily. (Patient not taking: Reported on 08/28/2014) 15 mL 12  . traMADol (ULTRAM) 50 MG tablet One in the AM and two in the PM as needed for pain (Patient not taking: Reported on 08/28/2014) 90  tablet 2    Objective: BP 131/79 mmHg  Pulse 71  Temp(Src) 97.5 F (36.4 C) (Oral)  Resp 23  SpO2 93% Exam: General: 48yo female resting comfortably in no apparent distress HEENT: Moist mucus membranes, PERRLA, JVD 5+ Cardiovascular: S1 and S2 noted. No murmurs. Regular rate and rhythm. S3 noted. Respiratory: No wheezing noted. No increased work of breathing. No increased expiratory phase. Crackles at lung bases bilaterally. Chest: Tenderness to palpation over sternum. Ribs: expiratory somatic dysfunction on left vs. inspiratory somatic dysfunction on right. Abdomen: bowel sounds noted. Soft and nondistended. Nontender to  palpation. Extremities: Mild edema noted in lower extremities. Pulses palpable. Skin: No rashes noted. Warm. Neuro: No focal deficits. Alert.  Labs and Imaging: CBC BMET   Recent Labs Lab 08/28/14 0822  WBC 6.9  HGB 11.9*  HCT 36.1  PLT 268  BNP- 1700 D-Dimer 0.75   Recent Labs Lab 08/28/14 0822  NA 142  K 4.3  CL 105  CO2 24  BUN 9  CREATININE 0.71  GLUCOSE 152*  CALCIUM 9.7    Dg Chest 2 View  08/28/2014   CLINICAL DATA:  Cough and shortness of breath for 2 weeks. No fever. History of diabetes and hypertension.  EXAM: CHEST  2 VIEW  COMPARISON:  11/23/2013; 07/31/2013  FINDINGS: Apparent mild enlargement of the cardiac silhouette. The pulmonary vasculature appears less distinct the present examination with cephalization of flow. Minimal pleural parenchymal thickening about the right minor and bilateral major fissures. Interval development trace bilateral effusions with associated worsening bibasilar heterogeneous opacities. Unchanged bones.  IMPRESSION: 1. Suspected mild/early pulmonary edema with trace bilateral effusions and associated bibasilar opacities, likely atelectasis. 2. Apparent minimal enlargement of the cardiac silhouette, possibly accentuated due to inspiratory effort though could be seen in the setting of a pericardial effusion. Further evaluation with cardiac echo could be performed as clinically indicated.   Electronically Signed   By: Simonne Come M.D.   On: 08/28/2014 09:06   Ct Angio Chest Pe W/cm &/or Wo Cm  08/28/2014   CLINICAL DATA:  Mid sternal chest pain for 3 weeks, cough, pleuritic chest pain  EXAM: CT ANGIOGRAPHY CHEST WITH CONTRAST  TECHNIQUE: Multidetector CT imaging of the chest was performed using the standard protocol during bolus administration of intravenous contrast. Multiplanar CT image reconstructions and MIPs were obtained to evaluate the vascular anatomy.  CONTRAST:  OMNIPAQUE IOHEXOL 350 MG/ML SOLN  COMPARISON:  CT 09/30/2007   FINDINGS: The examination is adequate for evaluation for acute pulmonary embolism up to and including the 3rd order pulmonary arteries. No focal filling defect is identified up to and including the 3rd order pulmonary arteries to suggest acute pulmonary embolism.  Small pleural effusions are noted. Prominence of mediastinal and hilar nodes is identified with representative 1 cm pretracheal node identified image 30 and right hilar node 0.7 cm image 47. Great vessels are normal in caliber. Hypodense 2.3 cm isthmus thyroid nodule reidentified image 6. Mild cardiomegaly noted. Trace pericardial fluid.  Minimal subpleural scarring or atelectasis. A few areas of patchy ground-glass airspace opacity at the lung bases are noted. Mild central bronchial wall thickening.  No acute osseous abnormality.  At incomplete imaging of the upper abdomen, again noted is an ill-defined 2.8 cm left hepatic lobe lesion image 83. This is incompletely characterized but stable since 2008 and therefore highly likely to be benign.  Review of the MIP images confirms the above findings.  IMPRESSION: Pleural effusions and areas of ground-glass airspace opacity  and bronchial wall thickening likely indicating mild pulmonary edema. Probable reactive lymph nodes in the mediastinum and hilum.  No CT evidence for acute pulmonary embolism.   Electronically Signed   By: Christiana Pellant M.D.   On: 08/28/2014 10:44   Araceli Bouche, DO 08/28/2014, 2:52 PM PGY-1, [facility] FPTS Intern pager: [phone], text pages welcome  Agree with excellent noted by Dr. Caroleen Hamman.We developed the plan that is described in the note and I agree with the content. Any additions made in blue.  Charlane Ferretti, MD Family Medicine PGY-2 Please page or call with questions

## 2014-08-28 NOTE — ED Notes (Signed)
Pt c/o central chest pain and dry cough that has been going on for 3 weeks. Pt states that pain is dull and constant.  Pt denies chest pain radiating anywhere but [place], nausea with it.

## 2014-08-28 NOTE — ED Notes (Signed)
Pt up to restroom.

## 2014-08-28 NOTE — ED Provider Notes (Signed)
CSN: [id number]     Arrival date & time 08/28/14  0750 History   First MD Initiated Contact with Patient 08/28/14 [phone]     Chief Complaint  Patient presents with  . Chest Pain  . Cough     (Consider location/radiation/quality/duration/timing/severity/associated sxs/prior Treatment) Patient is a 48 y.o. female presenting with cough. The history is provided by the patient. No language interpreter was used.  Cough Cough characteristics:  Dry Severity:  Moderate Onset quality:  Gradual Duration:  3 weeks Timing:  Constant Progression:  Worsening Chronicity:  New Smoker: yes   Relieved by:  Nothing Worsened by:  Nothing tried Ineffective treatments:  None tried Associated symptoms: chest pain and shortness of breath   Associated symptoms: no chills, no diaphoresis, no eye discharge, no fever, no headaches, no rhinorrhea, no sinus congestion, no sore throat, no weight loss and no wheezing   Associated symptoms comment:  Bilateral lower extremity edema Chest pain:    Quality:  Pressure   Severity:  Moderate   Onset quality:  Unable to specify   Duration:  1 week   Timing:  Constant   Progression:  Unchanged   Chronicity:  New   Past Medical History  Diagnosis Date  . Hypertension   . Diabetes mellitus   . Hypercholesteremia    Past Surgical History  Procedure Laterality Date  . Knee surgery    . Tubal ligation    . Endometrial ablation     No family history on file. History  Substance Use Topics  . Smoking status: Current Every Day Smoker -- 0.50 packs/day for 21 years    Types: Cigarettes  . Smokeless tobacco: Not on file  . Alcohol Use: No   OB History    No data available     Review of Systems  Constitutional: Negative for fever, chills, weight loss, diaphoresis, activity change, appetite change and fatigue.  HENT: Negative for congestion, facial swelling, rhinorrhea and sore throat.   Eyes: Negative for photophobia and discharge.  Respiratory: Positive  for cough and shortness of breath. Negative for chest tightness and wheezing.   Cardiovascular: Positive for chest pain. Negative for palpitations and leg swelling.  Gastrointestinal: Negative for nausea, vomiting, abdominal pain and diarrhea.  Endocrine: Negative for polydipsia and polyuria.  Genitourinary: Negative for dysuria, frequency, difficulty urinating and pelvic pain.  Musculoskeletal: Negative for back pain, arthralgias, neck pain and neck stiffness.  Skin: Negative for color change and wound.  Allergic/Immunologic: Negative for immunocompromised state.  Neurological: Negative for facial asymmetry, weakness, numbness and headaches.  Hematological: Does not bruise/bleed easily.  Psychiatric/Behavioral: Negative for confusion and agitation.      Allergies  Codeine; Darvocet; Morphine and related; Sulfamethoxazole-trimethoprim; and Vicodin  Home Medications   Prior to Admission medications   Medication Sig Start Date End Date Taking? Authorizing Provider  aspirin 81 MG EC tablet Take 81 mg by mouth daily.     Yes Historical Provider, MD  enalapril (VASOTEC) 10 MG tablet Take 10 mg by mouth daily.   Yes Historical Provider, MD  glimepiride (AMARYL) 4 MG tablet Take 1 tablet (4 mg total) by mouth daily with breakfast. 02/22/14  Yes Lonia SkinnerStephanie E Losq, MD  metFORMIN (GLUCOPHAGE) 1000 MG tablet Take 1 tablet (1,000 mg total) by mouth 2 (two) times daily with a meal. 07/30/14  Yes Joanna Puffrystal S Dorsey, MD  ranitidine (ZANTAC) 150 MG capsule Take 1 capsule (150 mg total) by mouth 2 (two) times daily. 11/23/13  Yes Lonia SkinnerStephanie E Losq,  MD  albuterol (PROVENTIL HFA;VENTOLIN HFA) 108 (90 BASE) MCG/ACT inhaler Inhale 2 puffs into the lungs every 4 (four) hours as needed for wheezing (cough, shortness of breath or wheezing.). Patient not taking: Reported on 08/28/2014 07/31/13   Collene Gobble, MD  atorvastatin (LIPITOR) 40 MG tablet Take 1 tablet (40 mg total) by mouth daily. Patient not taking:  Reported on 08/28/2014 02/22/14   Lonia Skinner, MD  enalapril (VASOTEC) 10 MG tablet TAKE 1 TABLET BY MOUTH EVERY DAY Patient not taking: Reported on 08/28/2014 11/06/13   Lonia Skinner, MD  hydrochlorothiazide (HYDRODIURIL) 25 MG tablet TAKE 1 TABLET BY MOUTH EVERY DAY Patient not taking: Reported on 08/28/2014 11/06/13   Lonia Skinner, MD  ipratropium (ATROVENT) 0.06 % nasal spray Place 2 sprays into both nostrils 4 (four) times daily. Patient not taking: Reported on 08/28/2014 11/23/13   Lonia Skinner, MD  traMADol Janean Sark) 50 MG tablet One in the AM and two in the PM as needed for pain Patient not taking: Reported on 08/28/2014 03/10/12   Doree Albee, MD   BP 163/76 mmHg  Pulse 87  Temp(Src) 97.5 F (36.4 C) (Oral)  Resp 25  SpO2 100% Physical Exam  Constitutional: She is oriented to person, place, and time. She appears well-developed and well-nourished. No distress.  HENT:  Head: Normocephalic and atraumatic.  Mouth/Throat: No oropharyngeal exudate.  Eyes: Pupils are equal, round, and reactive to light.  Neck: Normal range of motion. Neck supple.  Cardiovascular: Normal rate, regular rhythm and normal heart sounds.  Exam reveals no gallop and no friction rub.   No murmur heard. Pulmonary/Chest: Effort normal. No respiratory distress. She has no wheezes. She has rales in the right lower field and the left lower field.  Abdominal: Soft. Bowel sounds are normal. She exhibits no distension and no mass. There is no tenderness. There is no rebound and no guarding.  Musculoskeletal: Normal range of motion. She exhibits edema. She exhibits no tenderness.  Neurological: She is alert and oriented to person, place, and time.  Skin: Skin is warm and dry.  Psychiatric: She has a normal mood and affect.    ED Course  Procedures (including critical care time) Labs Review Labs Reviewed  CBC - Abnormal; Notable for the following:    Hemoglobin 11.9 (*)    RDW 15.9 (*)    All other  components within normal limits  BASIC METABOLIC PANEL - Abnormal; Notable for the following:    Glucose, Bld 152 (*)    All other components within normal limits  PRO B NATRIURETIC PEPTIDE - Abnormal; Notable for the following:    Pro B Natriuretic peptide (BNP) 1700.0 (*)    All other components within normal limits  D-DIMER, QUANTITATIVE - Abnormal; Notable for the following:    D-Dimer, Quant 0.75 (*)    All other components within normal limits  I-STAT TROPOININ, ED  Rosezena Sensor, ED    Imaging Review Dg Chest 2 View  08/28/2014   CLINICAL DATA:  Cough and shortness of breath for 2 weeks. No fever. History of diabetes and hypertension.  EXAM: CHEST  2 VIEW  COMPARISON:  11/23/2013; 07/31/2013  FINDINGS: Apparent mild enlargement of the cardiac silhouette. The pulmonary vasculature appears less distinct the present examination with cephalization of flow. Minimal pleural parenchymal thickening about the right minor and bilateral major fissures. Interval development trace bilateral effusions with associated worsening bibasilar heterogeneous opacities. Unchanged bones.  IMPRESSION: 1. Suspected mild/early pulmonary edema with  trace bilateral effusions and associated bibasilar opacities, likely atelectasis. 2. Apparent minimal enlargement of the cardiac silhouette, possibly accentuated due to inspiratory effort though could be seen in the setting of a pericardial effusion. Further evaluation with cardiac echo could be performed as clinically indicated.   Electronically Signed   By: Simonne Come M.D.   On: 08/28/2014 09:06   Ct Angio Chest Pe W/cm &/or Wo Cm  08/28/2014   CLINICAL DATA:  Mid sternal chest pain for 3 weeks, cough, pleuritic chest pain  EXAM: CT ANGIOGRAPHY CHEST WITH CONTRAST  TECHNIQUE: Multidetector CT imaging of the chest was performed using the standard protocol during bolus administration of intravenous contrast. Multiplanar CT image reconstructions and MIPs were obtained to  evaluate the vascular anatomy.  CONTRAST:  OMNIPAQUE IOHEXOL 350 MG/ML SOLN  COMPARISON:  CT 09/30/2007  FINDINGS: The examination is adequate for evaluation for acute pulmonary embolism up to and including the 3rd order pulmonary arteries. No focal filling defect is identified up to and including the 3rd order pulmonary arteries to suggest acute pulmonary embolism.  Small pleural effusions are noted. Prominence of mediastinal and hilar nodes is identified with representative 1 cm pretracheal node identified image 30 and right hilar node 0.7 cm image 47. Great vessels are normal in caliber. Hypodense 2.3 cm isthmus thyroid nodule reidentified image 6. Mild cardiomegaly noted. Trace pericardial fluid.  Minimal subpleural scarring or atelectasis. A few areas of patchy ground-glass airspace opacity at the lung bases are noted. Mild central bronchial wall thickening.  No acute osseous abnormality.  At incomplete imaging of the upper abdomen, again noted is an ill-defined 2.8 cm left hepatic lobe lesion image 83. This is incompletely characterized but stable since 2008 and therefore highly likely to be benign.  Review of the MIP images confirms the above findings.  IMPRESSION: Pleural effusions and areas of ground-glass airspace opacity and bronchial wall thickening likely indicating mild pulmonary edema. Probable reactive lymph nodes in the mediastinum and hilum.  No CT evidence for acute pulmonary embolism.   Electronically Signed   By: Christiana Pellant M.D.   On: 08/28/2014 10:44     EKG Interpretation   Date/Time:  Sunday August 28 2014 07:57:34 EST Ventricular Rate:  78 PR Interval:  140 QRS Duration: 87 QT Interval:  388 QTC Calculation: 442 R Axis:   15 Text Interpretation:  Sinus rhythm Nonspecific T abnormalities, lateral  leads Baseline wander in lead(s) II III aVR aVF T waves more prominent of  EKG from dec '08, but similar to EKG on 8/'01 Confirmed by Chrishun Scheer  MD,  Damyon Mullane [phone]) on  08/28/2014 8:01:38 AM      MDM   Final diagnoses:  Cough  Chest pain  Pleuritic chest pain  Dyspnea    Pt is a 48 y.o. female with Pmhx as above who presents with about 3 weeks of dry cough. She has now had about one week of a central dull chest pain that is constant and nonradiating. She is associated shortness of breath that is worse with movement she has had no fever. She has had several days of bilateral lower extremity edema. She's had no recent immobilization or significant travel history. She is a less than one pack-a-day smoker. EKG with nonspecific T wave abnormalities. Troponin normal however BNP is elevated at 1700. D-dimer mildly elevated at 0.75 prompting a CT anterior chest which showed pleural effusions and mild pulmonary edema. Patient given 20 mg of IV Lasix with good urine output. However  she continued to be hypoxic during ambulation dropping down to as low as 84% on room air. Spoke with family practice who will admit and transfer to Mrs. Cohen for further treatment.    Toy Cookey, MD 08/28/14 [phone]

## 2014-08-28 NOTE — ED Notes (Signed)
Pt given lunch tray.

## 2014-08-28 NOTE — ED Notes (Addendum)
Pt had episode of desat when she was transported back from CT. Sats 82% on RA. Immediately replaced ans sats up to 94% on 2L

## 2014-08-28 NOTE — ED Notes (Signed)
Patient transported to X-ray 

## 2014-08-29 ENCOUNTER — Inpatient Hospital Stay (HOSPITAL_COMMUNITY): Payer: Self-pay

## 2014-08-29 DIAGNOSIS — E119 Type 2 diabetes mellitus without complications: Secondary | ICD-10-CM | POA: Insufficient documentation

## 2014-08-29 DIAGNOSIS — I10 Essential (primary) hypertension: Secondary | ICD-10-CM | POA: Insufficient documentation

## 2014-08-29 DIAGNOSIS — I5021 Acute systolic (congestive) heart failure: Principal | ICD-10-CM

## 2014-08-29 DIAGNOSIS — R0600 Dyspnea, unspecified: Secondary | ICD-10-CM | POA: Insufficient documentation

## 2014-08-29 DIAGNOSIS — R05 Cough: Secondary | ICD-10-CM

## 2014-08-29 DIAGNOSIS — I059 Rheumatic mitral valve disease, unspecified: Secondary | ICD-10-CM

## 2014-08-29 DIAGNOSIS — R059 Cough, unspecified: Secondary | ICD-10-CM | POA: Insufficient documentation

## 2014-08-29 DIAGNOSIS — R0781 Pleurodynia: Secondary | ICD-10-CM | POA: Insufficient documentation

## 2014-08-29 DIAGNOSIS — I509 Heart failure, unspecified: Secondary | ICD-10-CM

## 2014-08-29 LAB — GLUCOSE, CAPILLARY
GLUCOSE-CAPILLARY: 227 mg/dL — AB (ref 70–99)
Glucose-Capillary: 195 mg/dL — ABNORMAL HIGH (ref 70–99)
Glucose-Capillary: 99 mg/dL (ref 70–99)

## 2014-08-29 LAB — HEMOGLOBIN A1C
Hgb A1c MFr Bld: 6 % — ABNORMAL HIGH (ref <5.7)
Mean Plasma Glucose: 126 mg/dL — ABNORMAL HIGH (ref <117)

## 2014-08-29 IMAGING — CR DG CHEST 2V
2 series · 2 of 2 positions shown · non-contrast
Comparison: [DATE]

CLINICAL DATA: Shortness of breath and cough.

EXAM:
CHEST  2 VIEW

[w chest pa]
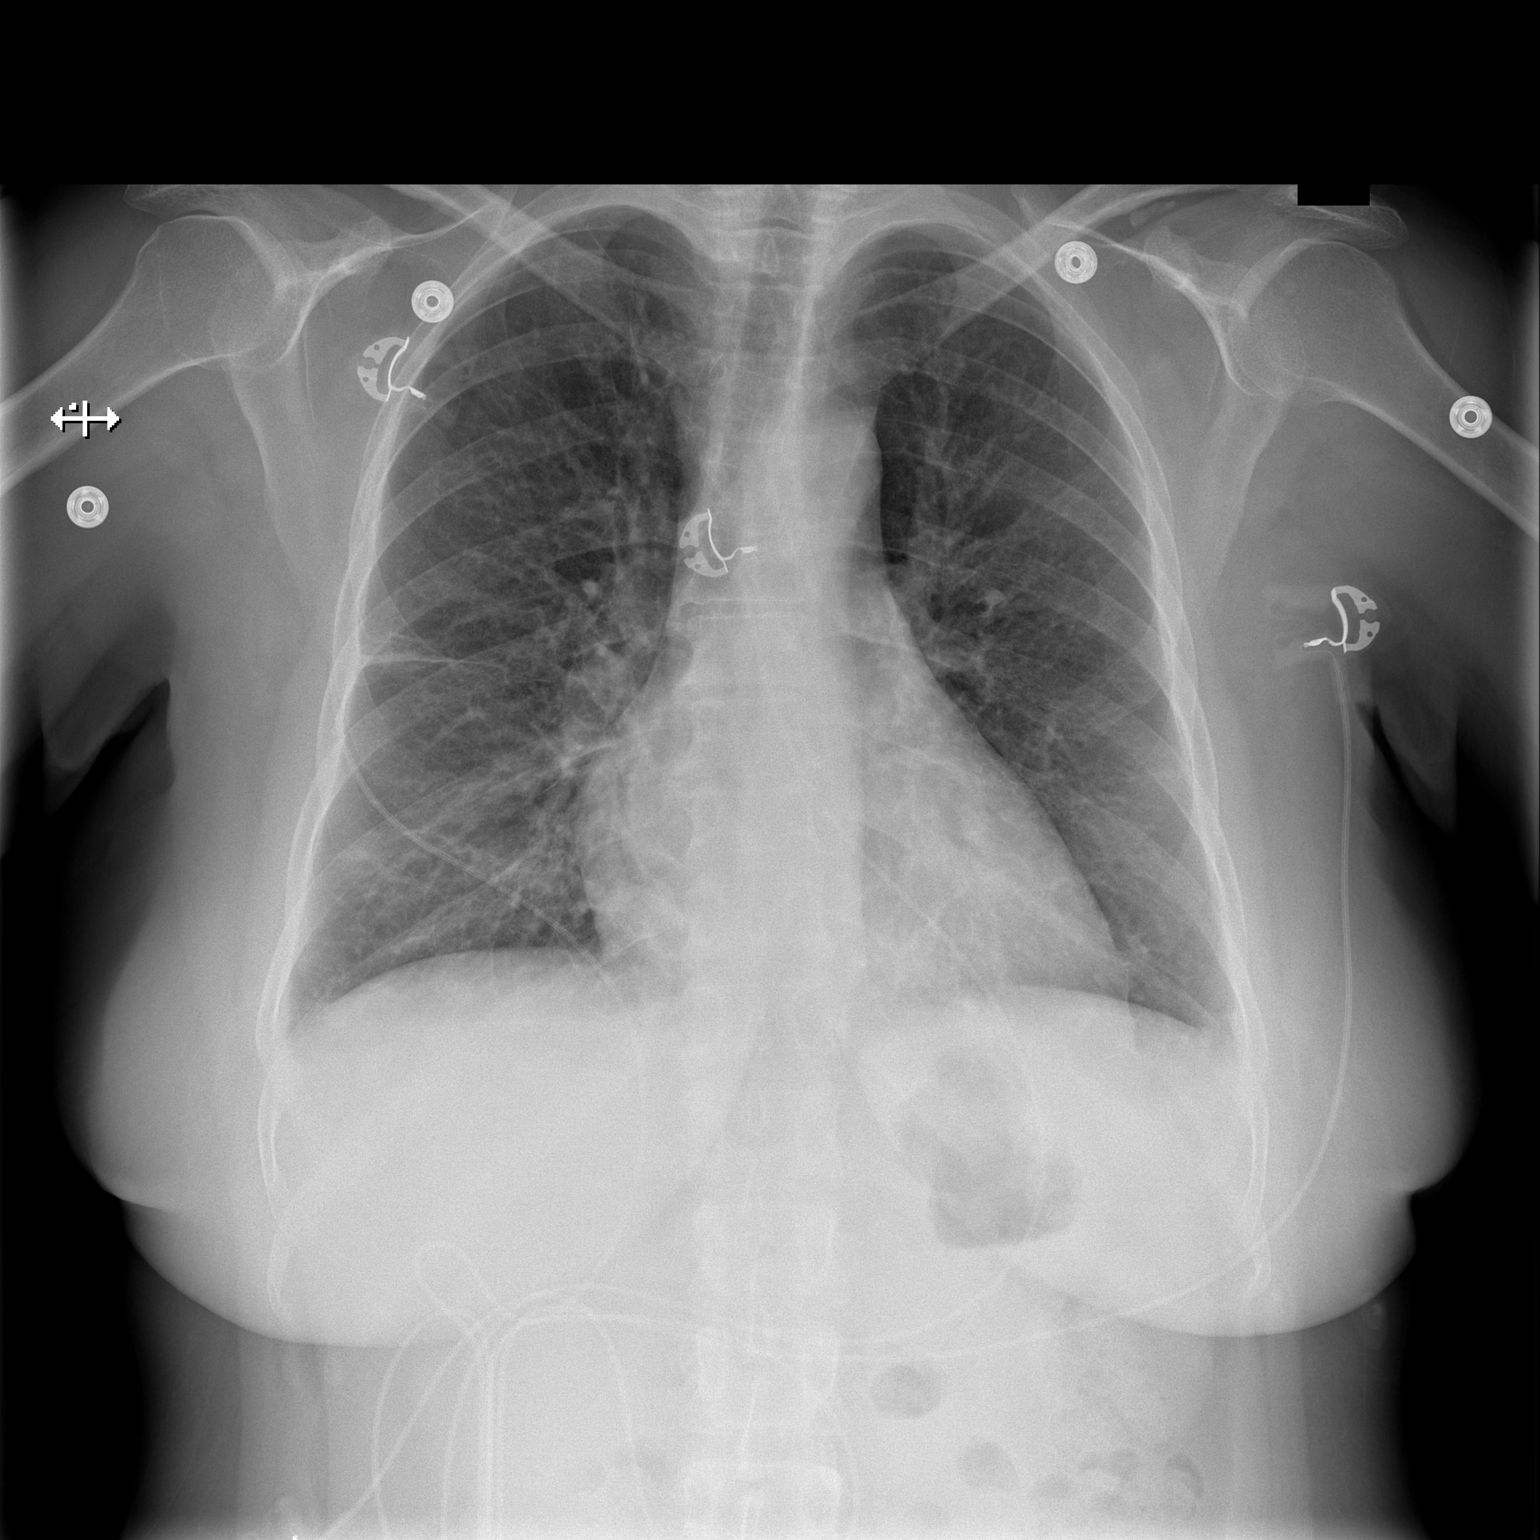

[w chest lat]
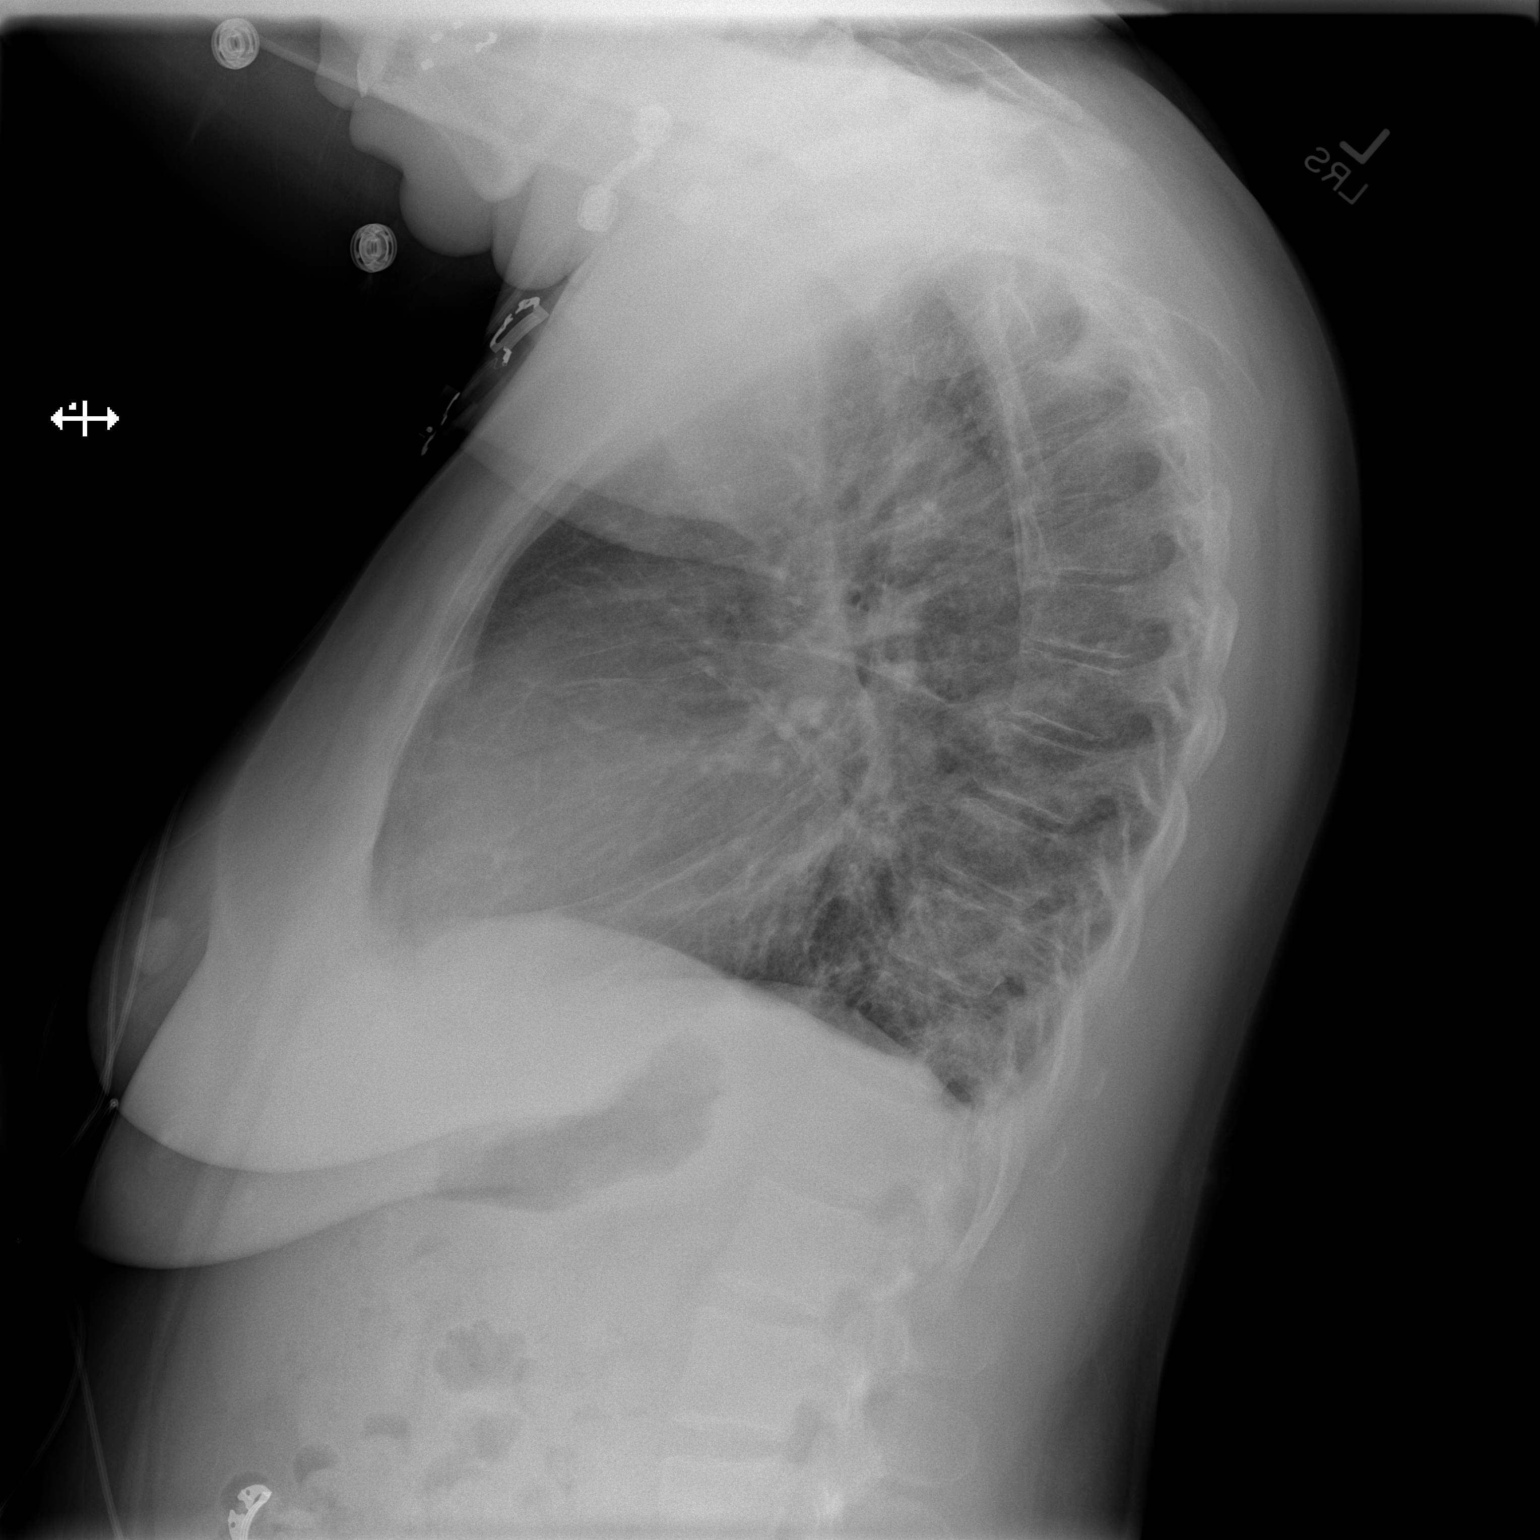

[2 of 2 positions shown; findings below may reference images not displayed]

FINDINGS: Heart size is within normal limits. There is persistent change of
interstitial edema. Small bilateral pleural effusions are present.
No new consolidations are identified. Visualized osseous structures
have a normal appearance.
IMPRESSION: Persistent interstitial pulmonary edema.

## 2014-08-29 MED ORDER — BENZONATATE 100 MG PO CAPS
100.0000 mg | ORAL_CAPSULE | Freq: Three times a day (TID) | ORAL | Status: DC | PRN
  Filled 2014-08-29: qty 1

## 2014-08-29 MED ORDER — LOSARTAN POTASSIUM 25 MG PO TABS: 25.0000 mg | ORAL_TABLET | Freq: Every day | ORAL | Status: DC

## 2014-08-29 MED ORDER — FUROSEMIDE 20 MG PO TABS: 40.0000 mg | ORAL_TABLET | Freq: Every day | ORAL | Status: DC

## 2014-08-29 MED ORDER — METOPROLOL SUCCINATE 12.5 MG HALF TABLET: 12.5000 mg | ORAL_TABLET | Freq: Every day | ORAL | Status: DC

## 2014-08-29 MED ORDER — FUROSEMIDE 40 MG PO TABS: 40.0000 mg | ORAL_TABLET | Freq: Every day | ORAL | Status: DC

## 2014-08-29 MED ORDER — INFLUENZA VAC SPLIT QUAD 0.5 ML IM SUSY: 0.5000 mL | PREFILLED_SYRINGE | INTRAMUSCULAR | Status: DC

## 2014-08-29 MED ORDER — PNEUMOCOCCAL VAC POLYVALENT 25 MCG/0.5ML IJ INJ
0.5000 mL | INJECTION | INTRAMUSCULAR | Status: DC
Start: 2014-08-30 — End: 2014-08-29

## 2014-08-29 MED ORDER — ATORVASTATIN CALCIUM 80 MG PO TABS
80.0000 mg | ORAL_TABLET | Freq: Every day | ORAL | Status: DC
  Filled 2014-08-29: qty 1

## 2014-08-29 MED ORDER — ROSUVASTATIN CALCIUM 20 MG PO TABS: 20.0000 mg | ORAL_TABLET | Freq: Every day | ORAL | Status: DC

## 2014-08-29 NOTE — Progress Notes (Signed)
Patient states she is leaving the hospital and there is nothing we can do to keep her here.  Telemetry removed and IV removed per patient requests. Azalee Course, [place] and family medicine MD are aware.

## 2014-08-29 NOTE — Progress Notes (Signed)
Discharge instructions reviewed with patient/family. Patient verbalized that MD already given her RXs. No other concerns voice at this time. Patient refused wheelchair and wanted to walk out at discharge. Family at bedside.  Sim Boast, RN

## 2014-08-29 NOTE — Progress Notes (Addendum)
Cardiology was consulted for CHF and newly diagnosed LV dysfunction. Patient fully dressed when I went into the room and determined to leave AMA. I have discussed with her potential complication of CHF and need for work to differentiate ischemic vs nonischemic cardiomyopathy. I have discussed with her the need for fluid restriction and Na restriction and potentially diuretic. I told her that if she leave now AMA, there is a possibility that she will returned with heart failure as well. Patient states "my mother had heart failure, now I have heart failure, I'll deal with it".   Since patient is unlikely to listen to me and determined to leave AMA, I have instructed the patient regarding sign and symptom of heart failure. And discussed with her the need to seek medical attention if she is having worsening LE edema, orthopnea or PND. I have discussed with her the need for fluid restriction and Na restriction.   Ramond Dial PA Pager: [phone]   I have attempted to page Family Medicine to update on her situation, currently waiting call back.

## 2014-08-29 NOTE — Progress Notes (Signed)
UR completed Aarti Mankowski K. Keiland Pickering, RN, BSN, MSHL, CCM  08/29/2014 3:16 PM

## 2014-08-29 NOTE — Progress Notes (Signed)
Grace Ferrell is one of my clinic patients. She came in with SOB and an echo consistent with CHF. Unfortunately, she was displeased that the cardiologist took so long to see her and wished to leave before a work-up could be done. She was changed to Crestor (more affordable by MAP) and metoprolol was added to her regimen.  Thank you to the [facility]MT[facility] for the wonderful care you gave to Grace Ferrell.  Joanna Puff, MD [facility] Family Medicine Resident

## 2014-08-29 NOTE — Progress Notes (Signed)
Attempted to reviewed new acute onset of patient's diagnosis of CHF. Patient stated "I don't have that, the doctor that just camed in here 30 minutes agao said I have fluid in my lungs:"  MD paged to relay information regarding pt's diagnosis. ECHO at bedside at this time. Wilol return and reviewed CHF packet with patient again.   Tanicia Wolaver, RN.

## 2014-08-29 NOTE — Discharge Summary (Signed)
Family Medicine [facility] Discharge Summary  Patient name: Grace CornLinda M Jane Medical record number: [id number] Date of birth: 07/01/1966 Age: 48 y.o. Gender: female Date of Admission: 08/28/2014  Date of Discharge: 08/29/2014 Admitting Physician: Nestor RampSara L Neal, MD  Primary Care Provider: Joanna Pufforsey, Crystal S, MD  Indication for Hospitalization: dyspnea Discharge Diagnoses:  1. Acute systolic CHF 2. Hypertension 3. Hyperlipidemia 4. Type 2 diabetes  Consultations: cardiology, could not be completed due to patient wanting to leave the hospital  Significant Labs and Imaging:   Recent Labs Lab 08/28/14 0822 08/28/14 1625  HGB 11.9* 11.1*  HCT 36.1 33.7*  WBC 6.9 7.2  PLT 268 221    Recent Labs Lab 08/28/14 0822 08/28/14 1625  NA 142  --   K 4.3  --   CL 105  --   CO2 24  --   GLUCOSE 152*  --   BUN 9  --   CREATININE 0.71 0.90  CALCIUM 9.7  --      Recent Labs Lab 08/28/14 1625 08/28/14 2200  TROPONINI <0.30 <0.30   proBNP 1700 D-dimer 0.75  CT Angio Chest 08/27/2014 IMPRESSION: Pleural effusions and areas of ground-glass airspace opacity and bronchial wall thickening likely indicating mild pulmonary edema. Probable reactive lymph nodes in the mediastinum and hilum.  No CT evidence for acute pulmonary embolism.  2 View chest 08/28/2014 IMPRESSION: 1. Suspected mild/early pulmonary edema with trace bilateral effusions and associated bibasilar opacities, likely atelectasis. 2. Apparent minimal enlargement of the cardiac silhouette, possibly accentuated due to inspiratory effort though could be seen in the setting of a pericardial effusion. Further evaluation with cardiac echo could be performed as clinically indicated.  2 view chest 08/29/2014 IMPRESSION: Persistent interstitial pulmonary edema.  Procedures: none  Brief Hospital Course:   Grace Ferrell is a 48 y.o. female presenting with worsening shortness of breath, edema, and cough x2-3 weeks.  She ran out of HTN medications approximately one month ago. PMH is significant for HTN, diabetes, and hypercholesterolemia.  Shorts of breath This was felt to be most likely due to new onset CHF. A TTE was obtained showing an EF of35-40% with diffuse hypokinesis and hypokinesis of the mid apical inferior myocardium. Cardiology was consult for recommendations however the patient refused to stay in the hospital long enough to see cardiology.  She came in with enalapril, aspirin, and HCTZ which were all continued. She states that she was not taking Lipitor and this was changed to Crestor to be better covered by the map program. 12.5 mg of metoprolol twice a day was added prior to her discharge.  Prior to leaving the hospital her dyspnea had improved and she was down 4 pounds.she was encouraged follow-up with her PCP whom she has a follow-up scheduled for tomorrow. She was also encouraged to follow-up with cardiology and information was given to her for follow-up.  Hypertension Data during admission, we continued enalapril and HCTZ. We also added metoprolol due to CHF.  Diabetes Her glimepiride and metformin from home were held and she is managed with sliding scale insulin. Her last A1c was 7.2. Her home meds were restarted on discharge  Hyperlipidemia Continue ASCVD risk score was calculated as 53.9%. She states that she is not taking Lipitor at home. We had a discussion that the map program would cover store and so prescription was given for that.   Discharge Medications:    Medication List    STOP taking these medications  atorvastatin 40 MG tablet  Commonly known as:  LIPITOR      TAKE these medications        albuterol 108 (90 BASE) MCG/ACT inhaler  Commonly known as:  PROVENTIL HFA;VENTOLIN HFA  Inhale 2 puffs into the lungs every 4 (four) hours as needed for wheezing (cough, shortness of breath or wheezing.).     aspirin 81 MG EC tablet  Take 81 mg by mouth daily.      benzonatate 100 MG capsule  Commonly known as:  TESSALON  Take 1 capsule (100 mg total) by mouth 2 (two) times daily as needed for cough.     enalapril 10 MG tablet  Commonly known as:  VASOTEC  Take 10 mg by mouth daily.     furosemide 40 MG tablet  Commonly known as:  LASIX  Take 1 tablet (40 mg total) by mouth daily.     glimepiride 4 MG tablet  Commonly known as:  AMARYL  Take 1 tablet (4 mg total) by mouth daily with breakfast.     hydrochlorothiazide 25 MG tablet  Commonly known as:  HYDRODIURIL  TAKE 1 TABLET BY MOUTH EVERY DAY     ipratropium 0.06 % nasal spray  Commonly known as:  ATROVENT  Place 2 sprays into both nostrils 4 (four) times daily.     metFORMIN 1000 MG tablet  Commonly known as:  GLUCOPHAGE  Take 1 tablet (1,000 mg total) by mouth 2 (two) times daily with a meal.     metoprolol succinate 12.5 mg Tb24 24 hr tablet  Commonly known as:  TOPROL-XL  Take 0.5 tablets (12.5 mg total) by mouth daily.     ranitidine 150 MG capsule  Commonly known as:  ZANTAC  Take 1 capsule (150 mg total) by mouth 2 (two) times daily.     rosuvastatin 20 MG tablet  Commonly known as:  CRESTOR  Take 1 tablet (20 mg total) by mouth daily.     traMADol 50 MG tablet  Commonly known as:  ULTRAM  One in the AM and two in the PM as needed for pain       Issues for Follow Up:  - encouraged heart failure follow-up - Follow-up BMP as she was started on loop diuretic - Closely monitor fluid status and she was started on a loop diuretic and continued on her thiazide diuretic.  Outstanding Results: none  Discharge Instructions: Please refer to Patient Instructions section of EMR for full details.  Patient was counseled important signs and symptoms that should prompt return to medical care, changes in medications, dietary instructions, activity restrictions, and follow up appointments.  Significant instructions noted below:      Follow-up Information    Follow up with  Joanna Puff, MD On 08/30/2014.   Specialty:  Family Medicine   Why:  At 1:30 pm, arrive at 1:15   Contact information:   [address] [place] [address] [phone]       Follow up with Valera Castle, MD.   Specialty:  Cardiology   Why:  This Wednesday in heart failure clinic or next wednresday of you cannot get in this week   Contact information:   [address] [place] [address] [phone]       Discharge Condition: stable  Kevin Fenton, MD 08/29/2014, 3:08 PM

## 2014-08-29 NOTE — Progress Notes (Signed)
Patient voiced that she is hungry with order to be NPO. I already notified teaching sx twice regarding her above concern but per teaching sx cardiology need to see pt. I informed patient. She appears upset and verbalized that someone need to see her soon, and if not, she would like to be discharge. Emotional support given. Patient is resting comfortably in bed with no other distress noted. Noon blood sugar stable. Cardiology paged. Awaiting on call back. Will continue to monitor.   Sim Boast, RN

## 2014-08-29 NOTE — Progress Notes (Signed)
Patient is requesting to leave AMA.  I explained the risk vs benefits. Patient refused to sign AMA form. Teaching sx paged and Dr. Leonides Schanz is aware. Patient appears very upset at this time. Writer unabel to reason with patient. CN notified.   Sim Boast, RN

## 2014-08-29 NOTE — Progress Notes (Signed)
  Echocardiogram 2D Echocardiogram has been performed.  Grace Ferrell FRANCES 08/29/2014, 9:16 AM

## 2014-08-29 NOTE — Progress Notes (Signed)
Family Medicine Teaching Service Daily Progress Note Intern Pager: [phone]  Patient name: Grace Ferrell Medical record number: [id number] Date of birth: 09/10/1966 Age: 48 y.o. Gender: female  Primary Care Provider: Joanna Pufforsey, Crystal S, MD Consultants: None Code Status: DNR  Pt Overview and Major Events to Date:  08[facility]29/14: [facility]  Assessment and Plan:  Grace Ferrell is a 48 y.o. female presenting with worsening shortness of breath, edema, and cough x2-3 weeks. She ran out of HTN medications approximately one month ago. PMH is significant for HTN, diabetes, and hypercholesterolemia.  # Shortness of Breath: Suspected to be secondary to new onset CHF. Shortness of breath worsening over two weeks with increased edema. DDx includes pneumonia (afebrile, WBC normal), COPD (no increased expiratory phase on exam), MI (trending troponins), PE (tachypneic, HR WNL, Wells Score 0, D-dimer 0.75, CT no evidence of PE), and changes secondary to smoking (smoked 1/2ppd x30 years). - BNP- 1700 - Troponins negative.  - WBC 6.9>>7.2 - D-dimer 0.75 - CT: No CT evidence for acute pulmonary embolism - CXR: Suspected mild/early pulmonary edema with trace bilateral - Echocardiogram- EF 35% to 40%. Diffuse hypokinesis. There is hypokinesis ofthe mid-apicalinferior myocardium - Lasix as needed for diuresis. 60mg  total given on 11/8. Will reassess needs daily - Albuterol nebulizer PRN SOB. Atrovent. - Fentanyl for pain. NSAIDs contraindicated due to possible CHF. Allergy to Codeine, Morphine, Vicodin. - Obtain daily weights/strict i/os  - 165 lb at admission. 161lb this morning. - Cardiology Consulted--appreciate recommendations. - Cough potentially related to ACE allergy, will switch to arb, avoid BB in possible acute CHF, consider spiro pending  # HTN- Home medications include Enalapril 10mg , HCTZ 25mg .  - Enalapril discontinued due to cough. Initiated on Losartan 25mg .  Continue HCTZ. - 143/57 this morning  # Diabetes- Home medications include Glimepiride 4mg , Metformin 1000mg  BID - Last A1C 7.2 in 02/2014. Will recheck during hospitalization. - Moderate SSI  # Hypercholesterolemia- Lipid panel in 11/2013 showed Cholesterol 258, TG 287, HDL 37, and LDL 164. ASCVD Risk of 53.9% (Female, 48yo, African American, TC 258, HDL 153, HTN, DM, Smoker). - Home medications include Aspirin 81mg , Atorvastatin 40mg . Will continue. Consider increasing dose of Atorvastatin as tolerated.  FEN/GI: Heart Healthy/Carb Modified. NPO after midnight Prophylaxis: Subcutaneous Heparin  [facility]  Subjective:  No acute complaints overnight. States she is feeling much better today. Shortness of breath has improved. Complains of frontal headache and continues to note cough. No further complaints today.  Objective: Temp:  [97.5 F (36.4 C)-98.2 F (36.8 C)] 97.9 F (36.6 C) (11/09 0542) Pulse Rate:  [71-87] 73 (11/09 0542) Resp:  [18-25] 18 (11/09 0542) BP: (131-163)/(57-79) 143/57 mmHg (11/09 0542) SpO2:  [85 %-100 %] 93 % (11/09 0542) Weight:  [161 lb 8 oz (73.256 kg)-165 lb 6.4 oz (75.025 kg)] 161 lb 8 oz (73.256 kg) (11/09 0542) Physical Exam: General: 48yo female resting comfortably in no apparent distress Cardiovascular: S1 and S2 noted. Regular rate and rhythm. Respiratory: Mild crackles in bases bilaterally. No increased work of breathing. Abdomen: Soft and non-distended. Nontender. Bowel sounds noted. Extremities: No edema noted. Pulses palpable.  Laboratory:  Recent Labs Lab 08/28/14 0822 08/28/14 1625  WBC 6.9 7.2  HGB 11.9* 11.1*  HCT 36.1 33.7*  PLT 268 221    Recent Labs Lab 08/28/14 0822 08/28/14 1625  NA 142  --   K 4.3  --   CL 105  --   CO2  24  --   BUN 9  --   CREATININE 0.71 0.90  CALCIUM 9.7  --   GLUCOSE 152*  --   - Troponins negative - D-dimer- 0.75 - BNP-  1700  Imaging/Diagnostic Tests: Dg Chest 2 View  08/28/2014   CLINICAL DATA:  Cough and shortness of breath for 2 weeks. No fever. History of diabetes and hypertension.  EXAM: CHEST  2 VIEW  COMPARISON:  11/23/2013; 07/31/2013  FINDINGS: Apparent mild enlargement of the cardiac silhouette. The pulmonary vasculature appears less distinct the present examination with cephalization of flow. Minimal pleural parenchymal thickening about the right minor and bilateral major fissures. Interval development trace bilateral effusions with associated worsening bibasilar heterogeneous opacities. Unchanged bones.  IMPRESSION: 1. Suspected mild/early pulmonary edema with trace bilateral effusions and associated bibasilar opacities, likely atelectasis. 2. Apparent minimal enlargement of the cardiac silhouette, possibly accentuated due to inspiratory effort though could be seen in the setting of a pericardial effusion. Further evaluation with cardiac echo could be performed as clinically indicated.   Electronically Signed   By: Simonne Come M.D.   On: 08/28/2014 09:06   Ct Angio Chest Pe W/cm &/or Wo Cm  08/28/2014   CLINICAL DATA:  Mid sternal chest pain for 3 weeks, cough, pleuritic chest pain  EXAM: CT ANGIOGRAPHY CHEST WITH CONTRAST  TECHNIQUE: Multidetector CT imaging of the chest was performed using the standard protocol during bolus administration of intravenous contrast. Multiplanar CT image reconstructions and MIPs were obtained to evaluate the vascular anatomy.  CONTRAST:  OMNIPAQUE IOHEXOL 350 MG/ML SOLN  COMPARISON:  CT 09/30/2007  FINDINGS: The examination is adequate for evaluation for acute pulmonary embolism up to and including the 3rd order pulmonary arteries. No focal filling defect is identified up to and including the 3rd order pulmonary arteries to suggest acute pulmonary embolism.  Small pleural effusions are noted. Prominence of mediastinal and hilar nodes is identified with representative 1 cm  pretracheal node identified image 30 and right hilar node 0.7 cm image 47. Great vessels are normal in caliber. Hypodense 2.3 cm isthmus thyroid nodule reidentified image 6. Mild cardiomegaly noted. Trace pericardial fluid.  Minimal subpleural scarring or atelectasis. A few areas of patchy ground-glass airspace opacity at the lung bases are noted. Mild central bronchial wall thickening.  No acute osseous abnormality.  At incomplete imaging of the upper abdomen, again noted is an ill-defined 2.8 cm left hepatic lobe lesion image 83. This is incompletely characterized but stable since 2008 and therefore highly likely to be benign.  Review of the MIP images confirms the above findings.  IMPRESSION: Pleural effusions and areas of ground-glass airspace opacity and bronchial wall thickening likely indicating mild pulmonary edema. Probable reactive lymph nodes in the mediastinum and hilum.  No CT evidence for acute pulmonary embolism.   Electronically Signed   By: Christiana Pellant M.D.   On: 08/28/2014 10:44   Araceli Bouche, DO 08/29/2014, 8:30 AM PGY-1, [facility] FPTS Intern pager: [phone], text pages welcome

## 2014-08-29 NOTE — Care Management Note (Addendum)
  Page 1 of 1   08/29/2014     3:15:49 PM CARE MANAGEMENT NOTE 08/29/2014  Patient:  SEANDRA, CRISLIP   Account Number:  [id number]  Date Initiated:  08/29/2014  Documentation initiated by:  Donato Schultz  Subjective/Objective Assessment:   CHF     Action/Plan:   CM to follow for disposition needs   Anticipated DC Date:  08/29/2014   Anticipated DC Plan:  HOME/SELF CARE         Choice offered to / List presented to:             Status of service:  Completed, signed off Medicare Important Message given?  NA - LOS <3 / Initial given by admissions (If response is "NO", the following Medicare IM given date fields will be blank) Date Medicare IM given:   Medicare IM given by:   Date Additional Medicare IM given:   Additional Medicare IM given by:    Discharge Disposition:  HOME/SELF CARE  Per UR Regulation:  Reviewed for med. necessity/level of care/duration of stay  If discussed at Long Length of Stay Meetings, dates discussed:    Comments:  Penina Reisner RN, BSN, MSHL, CCM  Nurse - Case Manager,  ([facility])  [phone]  08/18/2014

## 2014-08-29 NOTE — Discharge Instructions (Signed)
Grace Ferrell have a new diagnosis of heart failure, theres a lot of information below about it.   The medicines that you should specifically take for heart failure are: Aspirin Metoprolol Losartan And Lasix (the fluid pill)  Crestor- the cholesterol medicine that is covered at the [facility] program   Hydrochlorothiazide is a blood pressure medicine that is beneficial as well.  Please come to your appointment tomorrow  Please consider seeing a Heart doctor, they may be able to help you more than just with medicines.    The [facility] program is at : [address], [place], [place].         [phone]      Heart Failure Heart failure is a condition in which the heart has trouble pumping blood. This means your heart does not pump blood efficiently for your body to work well. In some cases of heart failure, fluid may back up into your lungs or you may have swelling (edema) in your lower legs. Heart failure is usually a long-term (chronic) condition. It is important for you to take good care of yourself and follow your health care provider's treatment plan. CAUSES  Some health conditions can cause heart failure. Those health conditions include:  High blood pressure (hypertension). Hypertension causes the heart muscle to work harder than normal. When pressure in the blood vessels is high, the heart needs to pump (contract) with more force in order to circulate blood throughout the body. High blood pressure eventually causes the heart to become stiff and weak.  Coronary artery disease (CAD). CAD is the buildup of cholesterol and fat (plaque) in the arteries of the heart. The blockage in the arteries deprives the heart muscle of oxygen and blood. This can cause chest pain and may lead to a heart attack. High blood pressure can also contribute to CAD.  Heart attack (myocardial infarction). A heart attack occurs when one or more arteries in the heart become blocked. The loss of  oxygen damages the muscle tissue of the heart. When this happens, part of the heart muscle dies. The injured tissue does not contract as well and weakens the heart's ability to pump blood.  Abnormal heart valves. When the heart valves do not open and close properly, it can cause heart failure. This makes the heart muscle pump harder to keep the blood flowing.  Heart muscle disease (cardiomyopathy or myocarditis). Heart muscle disease is damage to the heart muscle from a variety of causes. These can include drug or alcohol abuse, infections, or unknown reasons. These can increase the risk of heart failure.  Lung disease. Lung disease makes the heart work harder because the lungs do not work properly. This can cause a strain on the heart, leading it to fail.  Diabetes. Diabetes increases the risk of heart failure. High blood sugar contributes to high fat (lipid) levels in the blood. Diabetes can also cause slow damage to tiny blood vessels that carry important nutrients to the heart muscle. When the heart does not get enough oxygen and food, it can cause the heart to become weak and stiff. This leads to a heart that does not contract efficiently.  Other conditions can contribute to heart failure. These include abnormal heart rhythms, thyroid problems, and low blood counts (anemia). Certain unhealthy behaviors can increase the risk of heart failure, including:  Being overweight.  Smoking or chewing tobacco.  Eating foods high in fat and cholesterol.  Abusing illicit drugs or alcohol.  Lacking  physical activity. SYMPTOMS  Heart failure symptoms may vary and can be hard to detect. Symptoms may include:  Shortness of breath with activity, such as climbing stairs.  Persistent cough.  Swelling of the feet, ankles, legs, or abdomen.  Unexplained weight gain.  Difficulty breathing when lying flat (orthopnea).  Waking from sleep because of the need to sit up and get more air.  Rapid  heartbeat.  Fatigue and loss of energy.  Feeling light-headed, dizzy, or close to fainting.  Loss of appetite.  Nausea.  Increased urination during the night (nocturia). DIAGNOSIS  A diagnosis of heart failure is based on your history, symptoms, physical examination, and diagnostic tests. Diagnostic tests for heart failure may include:  Echocardiography.  Electrocardiography.  Chest X-ray.  Blood tests.  Exercise stress test.  Cardiac angiography.  Radionuclide scans. TREATMENT  Treatment is aimed at managing the symptoms of heart failure. Medicines, behavioral changes, or surgical intervention may be necessary to treat heart failure.  Medicines to help treat heart failure may include:  Angiotensin-converting enzyme (ACE) inhibitors. This type of medicine blocks the effects of a blood protein called angiotensin-converting enzyme. ACE inhibitors relax (dilate) the blood vessels and help lower blood pressure.  Angiotensin receptor blockers (ARBs). This type of medicine blocks the actions of a blood protein called angiotensin. Angiotensin receptor blockers dilate the blood vessels and help lower blood pressure.  Water pills (diuretics). Diuretics cause the kidneys to remove salt and water from the blood. The extra fluid is removed through urination. This loss of extra fluid lowers the volume of blood the heart pumps.  Beta blockers. These prevent the heart from beating too fast and improve heart muscle strength.  Digitalis. This increases the force of the heartbeat.  Healthy behavior changes include:  Obtaining and maintaining a healthy weight.  Stopping smoking or chewing tobacco.  Eating heart-healthy foods.  Limiting or avoiding alcohol.  Stopping illicit drug use.  Physical activity as directed by your health care provider.  Surgical treatment for heart failure may include:  A procedure to open blocked arteries, repair damaged heart valves, or remove damaged  heart muscle tissue.  A pacemaker to improve heart muscle function and control certain abnormal heart rhythms.  An internal cardioverter defibrillator to treat certain serious abnormal heart rhythms.  A left ventricular assist device (LVAD) to assist the pumping ability of the heart. HOME CARE INSTRUCTIONS   Take medicines only as directed by your health care provider. Medicines are important in reducing the workload of your heart, slowing the progression of heart failure, and improving your symptoms.  Do not stop taking your medicine unless directed by your health care provider.  Do not skip any dose of medicine.  Refill your prescriptions before you run out of medicine. Your medicines are needed every day.  Engage in moderate physical activity if directed by your health care provider. Moderate physical activity can benefit some people. The elderly and people with severe heart failure should consult with a health care provider for physical activity recommendations.  Eat heart-healthy foods. Food choices should be free of trans fat and low in saturated fat, cholesterol, and salt (sodium). Healthy choices include fresh or frozen fruits and vegetables, fish, lean meats, legumes, fat-free or low-fat dairy products, and whole grain or high fiber foods. Talk to a dietitian to learn more about heart-healthy foods.  Limit sodium if directed by your health care provider. Sodium restriction may reduce symptoms of heart failure in some people. Talk to a dietitian  to learn more about heart-healthy seasonings.  Use healthy cooking methods. Healthy cooking methods include roasting, grilling, broiling, baking, poaching, steaming, or stir-frying. Talk to a dietitian to learn more about healthy cooking methods.  Limit fluids if directed by your health care provider. Fluid restriction may reduce symptoms of heart failure in some people.  Weigh yourself every day. Daily weights are important in the early  recognition of excess fluid. You should weigh yourself every morning after you urinate and before you eat breakfast. Wear the same amount of clothing each time you weigh yourself. Record your daily weight. Provide your health care provider with your weight record.  Monitor and record your blood pressure if directed by your health care provider.  Check your pulse if directed by your health care provider.  Lose weight if directed by your health care provider. Weight loss may reduce symptoms of heart failure in some people.  Stop smoking or chewing tobacco. Nicotine makes your heart work harder by causing your blood vessels to constrict. Do not use nicotine gum or patches before talking to your health care provider.  Keep all follow-up visits as directed by your health care provider. This is important.  Limit alcohol intake to no more than 1 drink per day for nonpregnant women and 2 drinks per day for men. One drink equals 12 ounces of beer, 5 ounces of wine, or 1 ounces of hard liquor. Drinking more than that is harmful to your heart. Tell your health care provider if you drink alcohol several times a week. Talk with your health care provider about whether alcohol is safe for you. If your heart has already been damaged by alcohol or you have severe heart failure, drinking alcohol should be stopped completely.  Stop illicit drug use.  Stay up-to-date with immunizations. It is especially important to prevent respiratory infections through current pneumococcal and influenza immunizations.  Manage other health conditions such as hypertension, diabetes, thyroid disease, or abnormal heart rhythms as directed by your health care provider.  Learn to manage stress.  Plan rest periods when fatigued.  Learn strategies to manage high temperatures. If the weather is extremely hot:  Avoid vigorous physical activity.  Use air conditioning or fans or seek a cooler location.  Avoid caffeine and  alcohol.  Wear loose-fitting, lightweight, and light-colored clothing.  Learn strategies to manage cold temperatures. If the weather is extremely cold:  Avoid vigorous physical activity.  Layer clothes.  Wear mittens or gloves, a hat, and a scarf when going outside.  Avoid alcohol.  Obtain ongoing education and support as needed.  Participate in or seek rehabilitation as needed to maintain or improve independence and quality of life. SEEK MEDICAL CARE IF:   Your weight increases by 03 lb/1.4 kg in 1 day or 05 lb/2.3 kg in a week.  You have increasing shortness of breath that is unusual for you.  You are unable to participate in your usual physical activities.  You tire easily.  You cough more than normal, especially with physical activity.  You have any or more swelling in areas such as your hands, feet, ankles, or abdomen.  You are unable to sleep because it is hard to breathe.  You feel like your heart is beating fast (palpitations).  You become dizzy or light-headed upon standing up. SEEK IMMEDIATE MEDICAL CARE IF:   You have difficulty breathing.  There is a change in mental status such as decreased alertness or difficulty with concentration.  You have a pain  or discomfort in your chest.  You have an episode of fainting (syncope). MAKE SURE YOU:   Understand these instructions.  Will watch your condition.  Will get help right away if you are not doing well or get worse. Document Released: 10/07/2005 Document Revised: 02/21/2014 Document Reviewed: 11/06/2012 [facility] Patient Information 2015 [place], [place]. This information is not intended to replace advice given to you by your health care provider. Make sure you discuss any questions you have with your health care provider.

## 2014-08-30 ENCOUNTER — Telehealth: Payer: Self-pay | Admitting: *Deleted

## 2014-08-30 ENCOUNTER — Encounter: Payer: Self-pay | Admitting: Family Medicine

## 2014-08-30 ENCOUNTER — Ambulatory Visit (INDEPENDENT_AMBULATORY_CARE_PROVIDER_SITE_OTHER): Payer: Self-pay | Admitting: Family Medicine

## 2014-08-30 VITALS — BP 152/87 | HR 87 | Temp 98.0°F | Ht 60.0 in | Wt 163.2 lb

## 2014-08-30 DIAGNOSIS — I10 Essential (primary) hypertension: Secondary | ICD-10-CM

## 2014-08-30 DIAGNOSIS — E785 Hyperlipidemia, unspecified: Secondary | ICD-10-CM

## 2014-08-30 DIAGNOSIS — E119 Type 2 diabetes mellitus without complications: Secondary | ICD-10-CM

## 2014-08-30 DIAGNOSIS — I5021 Acute systolic (congestive) heart failure: Secondary | ICD-10-CM

## 2014-08-30 MED ORDER — HYDROCHLOROTHIAZIDE 25 MG PO TABS: 25.0000 mg | ORAL_TABLET | Freq: Every day | ORAL | Status: DC

## 2014-08-30 MED ORDER — METOPROLOL SUCCINATE ER 25 MG PO TB24: 12.5000 mg | ORAL_TABLET | Freq: Every day | ORAL | Status: DC

## 2014-08-30 MED ORDER — ENALAPRIL MALEATE 10 MG PO TABS: 10.0000 mg | ORAL_TABLET | Freq: Every day | ORAL | Status: DC

## 2014-08-30 NOTE — Patient Instructions (Signed)
While I'd like for you to have all medications, here is the list with the most important first: Lasix, enalapril, metoprolol, metformin, Crestor, aspirin,  HCTZ, glimepiride Please follow up with cardiology tomorrow: they will discuss your heart failure in more detail Congratulations on your blood sugar management! Please make an appointment with me in 1 month.  Heart Failure Heart failure is a condition in which the heart has trouble pumping blood. This means your heart does not pump blood efficiently for your body to work well. In some cases of heart failure, fluid may back up into your lungs or you may have swelling (edema) in your lower legs. Heart failure is usually a long-term (chronic) condition. It is important for you to take good care of yourself and follow your health care provider's treatment plan. CAUSES  Some health conditions can cause heart failure. Those health conditions include:  High blood pressure (hypertension). Hypertension causes the heart muscle to work harder than normal. When pressure in the blood vessels is high, the heart needs to pump (contract) with more force in order to circulate blood throughout the body. High blood pressure eventually causes the heart to become stiff and weak.  Coronary artery disease (CAD). CAD is the buildup of cholesterol and fat (plaque) in the arteries of the heart. The blockage in the arteries deprives the heart muscle of oxygen and blood. This can cause chest pain and may lead to a heart attack. High blood pressure can also contribute to CAD.  Heart attack (myocardial infarction). A heart attack occurs when one or more arteries in the heart become blocked. The loss of oxygen damages the muscle tissue of the heart. When this happens, part of the heart muscle dies. The injured tissue does not contract as well and weakens the heart's ability to pump blood.  Abnormal heart valves. When the heart valves do not open and close properly, it can cause  heart failure. This makes the heart muscle pump harder to keep the blood flowing.  Heart muscle disease (cardiomyopathy or myocarditis). Heart muscle disease is damage to the heart muscle from a variety of causes. These can include drug or alcohol abuse, infections, or unknown reasons. These can increase the risk of heart failure.  Lung disease. Lung disease makes the heart work harder because the lungs do not work properly. This can cause a strain on the heart, leading it to fail.  Diabetes. Diabetes increases the risk of heart failure. High blood sugar contributes to high fat (lipid) levels in the blood. Diabetes can also cause slow damage to tiny blood vessels that carry important nutrients to the heart muscle. When the heart does not get enough oxygen and food, it can cause the heart to become weak and stiff. This leads to a heart that does not contract efficiently.  Other conditions can contribute to heart failure. These include abnormal heart rhythms, thyroid problems, and low blood counts (anemia). Certain unhealthy behaviors can increase the risk of heart failure, including:  Being overweight.  Smoking or chewing tobacco.  Eating foods high in fat and cholesterol.  Abusing illicit drugs or alcohol.  Lacking physical activity. SYMPTOMS  Heart failure symptoms may vary and can be hard to detect. Symptoms may include:  Shortness of breath with activity, such as climbing stairs.  Persistent cough.  Swelling of the feet, ankles, legs, or abdomen.  Unexplained weight gain.  Difficulty breathing when lying flat (orthopnea).  Waking from sleep because of the need to sit up and get  more air.  Rapid heartbeat.  Fatigue and loss of energy.  Feeling light-headed, dizzy, or close to fainting.  Loss of appetite.  Nausea.  Increased urination during the night (nocturia). DIAGNOSIS  A diagnosis of heart failure is based on your history, symptoms, physical examination, and  diagnostic tests. Diagnostic tests for heart failure may include:  Echocardiography.  Electrocardiography.  Chest X-ray.  Blood tests.  Exercise stress test.  Cardiac angiography.  Radionuclide scans. TREATMENT  Treatment is aimed at managing the symptoms of heart failure. Medicines, behavioral changes, or surgical intervention may be necessary to treat heart failure.  Medicines to help treat heart failure may include:  Angiotensin-converting enzyme (ACE) inhibitors. This type of medicine blocks the effects of a blood protein called angiotensin-converting enzyme. ACE inhibitors relax (dilate) the blood vessels and help lower blood pressure.  Angiotensin receptor blockers (ARBs). This type of medicine blocks the actions of a blood protein called angiotensin. Angiotensin receptor blockers dilate the blood vessels and help lower blood pressure.  Water pills (diuretics). Diuretics cause the kidneys to remove salt and water from the blood. The extra fluid is removed through urination. This loss of extra fluid lowers the volume of blood the heart pumps.  Beta blockers. These prevent the heart from beating too fast and improve heart muscle strength.  Digitalis. This increases the force of the heartbeat.  Healthy behavior changes include:  Obtaining and maintaining a healthy weight.  Stopping smoking or chewing tobacco.  Eating heart-healthy foods.  Limiting or avoiding alcohol.  Stopping illicit drug use.  Physical activity as directed by your health care provider.  Surgical treatment for heart failure may include:  A procedure to open blocked arteries, repair damaged heart valves, or remove damaged heart muscle tissue.  A pacemaker to improve heart muscle function and control certain abnormal heart rhythms.  An internal cardioverter defibrillator to treat certain serious abnormal heart rhythms.  A left ventricular assist device (LVAD) to assist the pumping ability of the  heart. HOME CARE INSTRUCTIONS   Take medicines only as directed by your health care provider. Medicines are important in reducing the workload of your heart, slowing the progression of heart failure, and improving your symptoms.  Do not stop taking your medicine unless directed by your health care provider.  Do not skip any dose of medicine.  Refill your prescriptions before you run out of medicine. Your medicines are needed every day.  Engage in moderate physical activity if directed by your health care provider. Moderate physical activity can benefit some people. The elderly and people with severe heart failure should consult with a health care provider for physical activity recommendations.  Eat heart-healthy foods. Food choices should be free of trans fat and low in saturated fat, cholesterol, and salt (sodium). Healthy choices include fresh or frozen fruits and vegetables, fish, lean meats, legumes, fat-free or low-fat dairy products, and whole grain or high fiber foods. Talk to a dietitian to learn more about heart-healthy foods.  Limit sodium if directed by your health care provider. Sodium restriction may reduce symptoms of heart failure in some people. Talk to a dietitian to learn more about heart-healthy seasonings.  Use healthy cooking methods. Healthy cooking methods include roasting, grilling, broiling, baking, poaching, steaming, or stir-frying. Talk to a dietitian to learn more about healthy cooking methods.  Limit fluids if directed by your health care provider. Fluid restriction may reduce symptoms of heart failure in some people.  Weigh yourself every day. Daily weights are important  in the early recognition of excess fluid. You should weigh yourself every morning after you urinate and before you eat breakfast. Wear the same amount of clothing each time you weigh yourself. Record your daily weight. Provide your health care provider with your weight record.  Monitor and record  your blood pressure if directed by your health care provider.  Check your pulse if directed by your health care provider.  Lose weight if directed by your health care provider. Weight loss may reduce symptoms of heart failure in some people.  Stop smoking or chewing tobacco. Nicotine makes your heart work harder by causing your blood vessels to constrict. Do not use nicotine gum or patches before talking to your health care provider.  Keep all follow-up visits as directed by your health care provider. This is important.  Limit alcohol intake to no more than 1 drink per day for nonpregnant women and 2 drinks per day for men. One drink equals 12 ounces of beer, 5 ounces of wine, or 1 ounces of hard liquor. Drinking more than that is harmful to your heart. Tell your health care provider if you drink alcohol several times a week. Talk with your health care provider about whether alcohol is safe for you. If your heart has already been damaged by alcohol or you have severe heart failure, drinking alcohol should be stopped completely.  Stop illicit drug use.  Stay up-to-date with immunizations. It is especially important to prevent respiratory infections through current pneumococcal and influenza immunizations.  Manage other health conditions such as hypertension, diabetes, thyroid disease, or abnormal heart rhythms as directed by your health care provider.  Learn to manage stress.  Plan rest periods when fatigued.  Learn strategies to manage high temperatures. If the weather is extremely hot:  Avoid vigorous physical activity.  Use air conditioning or fans or seek a cooler location.  Avoid caffeine and alcohol.  Wear loose-fitting, lightweight, and light-colored clothing.  Learn strategies to manage cold temperatures. If the weather is extremely cold:  Avoid vigorous physical activity.  Layer clothes.  Wear mittens or gloves, a hat, and a scarf when going outside.  Avoid  alcohol.  Obtain ongoing education and support as needed.  Participate in or seek rehabilitation as needed to maintain or improve independence and quality of life. SEEK MEDICAL CARE IF:   Your weight increases by 03 lb/1.4 kg in 1 day or 05 lb/2.3 kg in a week.  You have increasing shortness of breath that is unusual for you.  You are unable to participate in your usual physical activities.  You tire easily.  You cough more than normal, especially with physical activity.  You have any or more swelling in areas such as your hands, feet, ankles, or abdomen.  You are unable to sleep because it is hard to breathe.  You feel like your heart is beating fast (palpitations).  You become dizzy or light-headed upon standing up. SEEK IMMEDIATE MEDICAL CARE IF:   You have difficulty breathing.  There is a change in mental status such as decreased alertness or difficulty with concentration.  You have a pain or discomfort in your chest.  You have an episode of fainting (syncope). MAKE SURE YOU:   Understand these instructions.  Will watch your condition.  Will get help right away if you are not doing well or get worse. Document Released: 10/07/2005 Document Revised: 02/21/2014 Document Reviewed: 11/06/2012 [facility] Patient Information 2015 [place], [place]. This information is not intended to replace advice  given to you by your health care provider. Make sure you discuss any questions you have with your health care provider.  

## 2014-08-30 NOTE — Assessment & Plan Note (Signed)
Patient's last lipid panel in 11/2013 revealed an elevated LDL and triglycerides. She has not been taking Lipitor as she could not afford it. She was given a Rx for Crestor to be filled with the [facility] program.  - Advised pt to have Crestor Rx filled  - Once she's been on this for ~2-3 months will repeat lipid panel and LFTs

## 2014-08-30 NOTE — Telephone Encounter (Signed)
New Rx with correct dosage faxed to [facility].  Clovis Pu, RN

## 2014-08-30 NOTE — Assessment & Plan Note (Signed)
Congratulated patient on improvement of A1c to 6.0. - Continue metformin and Amaryl  - If patient's financial issues prohibit her from getting some of her medications, consider discontinuing Amaryl.

## 2014-08-30 NOTE — Progress Notes (Signed)
[facility]  Grace S. Dorsey, MD  Subjective:  Chief complaint: initially scheduled as a "meet new doctor" however now a hospital follow-up  Patient presents for a hospital follow-up. She presented with cough, SOB, and edema x 2-3 weeks. She was noted to have a BNP of 1700, CXR with suspected mild/early pulmonary edema with trace bilateral effusions and associated bibasilar opacities, likely atelectasis, and an echocardiogram which revealed an EF of 35% to 40%, diffuse hypokinesis, and hypokinesis of the mid-apicalinferior myocardium.  She was very frustrated at being NPO awaiting cardiology consult and left prior to cardiology seeing her.  She was continued on enalapril and HCTZ and she was started on lasix 40, metoprolol 12.5mg, and ASA. Given issues with insurance, she was transitioned from Lipitor to Crestor and referred to [facility].  Today the patient states she still has a constant cough, however it is improved from admission. She denies any SOB and feels her LE swelling has improved. She states she hasn't been sleeping since she got home, as she's scared that if she falls asleep, she may not wake up.  She states she may try to sleep sitting up tonight, as she has fallen asleep while on the couch previously.   She tearfully admits to me that she hasn't gotten her prescriptions filled yet (she left the hospital yesterday afternoon). She does not have a car and relies on others for a ride. She has her prescriptions for metoprolol, Crestor, and Lasix in hand and tells me she's going to get them filled today. She asks me which of her medications are the most important, as she's worried that the [facility] program may not have some of her prescriptions in stock and she may not be able to afford the other prescriptions at a commercial pharmacy. I advised her that today, the most important would be Lasix and enalapril, then metoprolol. She has a f/u appt scheduled with cardiology tomorrow at 10:30 and  I advise her to please speak with them about whether they may have samples in their office.  DM: Patient doing well, taking Amaryl and metformin. No issues with hypoglycemia. Tries to stay active but admits her diet is not great, as she eats whatever her family/friends provide and inexpensive foods. No polyuria or polydipsia  ROS- No chest pain or change in bowel habits.   Past Medical History Patient Active Problem List   Diagnosis Date Noted  . Cough   . Dyspnea   . Pleuritic chest pain   . Essential hypertension   . Type 2 diabetes mellitus without complication   . Acute CHF 08/28/2014  . Acute sinusitis, unspecified 11/24/2013  . Dysuria 06/17/2013  . Menopause 04/11/2012  . Chronic sinusitis 04/11/2012  . HLD (hyperlipidemia) 03/12/2012  . Chronic pain syndrome 05/21/2011  . GOITER, UNSPECIFIED 01/08/2010  . TOBACCO USER 07/29/2009  . DIABETES MELLITUS II, UNCOMPLICATED 12/18/2006  . DEPRESSIVE DISORDER, NOS 12/18/2006  . HYPERTENSION, BENIGN SYSTEMIC 12/18/2006    Medications- reviewed and updated Current Outpatient Prescriptions  Medication Sig Dispense Refill  . aspirin 81 MG EC tablet Take 81 mg by mouth daily.      . enalapril (VASOTEC) 10 MG tablet Take 1 tablet (10 mg total) by mouth daily. 30 tablet 3  . furosemide (LASIX) 40 MG tablet Take 1 tablet (40 mg total) by mouth daily. 30 tablet 1  . glimepiride (AMARYL) 4 MG tablet Take 1 tablet (4 mg total) by mouth daily with breakfast. 30 tablet 6  .   hydrochlorothiazide (HYDRODIURIL) 25 MG tablet Take 1 tablet (25 mg total) by mouth daily. 30 tablet 3  . ipratropium (ATROVENT) 0.06 % nasal spray Place 2 sprays into both nostrils 4 (four) times daily. 15 mL 12  . metFORMIN (GLUCOPHAGE) 1000 MG tablet Take 1 tablet (1,000 mg total) by mouth 2 (two) times daily with a meal. 60 tablet 1  . metoprolol succinate (TOPROL-XL) 25 MG 24 hr tablet Take 0.5 tablets (12.5 mg total) by mouth daily. 30 tablet 2  . ranitidine  (ZANTAC) 150 MG capsule Take 1 capsule (150 mg total) by mouth 2 (two) times daily. 60 capsule 2  . rosuvastatin (CRESTOR) 20 MG tablet Take 1 tablet (20 mg total) by mouth daily. 30 tablet 3   No current facility-administered medications for this visit.    Smokes 10 cigs/day   Objective: BP 152/87 mmHg  Pulse 87  Temp(Src) 98 F (36.7 C) (Oral)  Ht 5' (1.524 m)  Wt 163 lb 3.2 oz (74.027 kg)  BMI 31.87 kg/m2 Gen: No acute distress. Alert, cooperative with exam, tearful at times HEENT: Atraumatic, EOMI, PERRLA, Oropharynx clear. MMM CV: RRR. No murmurs, rubs, or gallops noted. 2+ radial and DP pulses bilaterally. Minimal pitting edema bilaterally. Resp: Mild crackles in the lung bilaterally.  No wheezing or rhonchi noted. No increased WOB. Abd: +BS. Soft, non-distended, non-tender. No rebound or guarding.  Ext: No gross deformities. Neuro: Alert and oriented, No gross focal deficits  Hemoglobin A1c 6.0 Assessment/Plan:  HYPERTENSION, BENIGN SYSTEMIC BP not at goal, however patient has not taken her HCTZ this AM.  For now, continue taking HCTZ and enalapril, but if she's going to conitinue Lasix in the long-tern, consider discontinuation of HCTZ.  Acute CHF Patient with continued cough with some mild crackles on exam, however she denies SOB. She has a f/u with cardiology on 11/11.  - continue Lasix, enalapril, and metoprolol - if patient is to continue on Lasix, consider discontinuation of HCTZ and addition of potassium supplement if she's found to be hypokalemic. - patient was very concerned about the idea of a hearth catheterization (as discussed by the cardiology PA who briefly met with hre) in the future, advised her to f/u with cardiology tomorrow to discuss options and benefits/risks.   HLD (hyperlipidemia) Patient's last lipid panel in 11/2013 revealed an elevated LDL and triglycerides. She has not been taking Lipitor as she could not afford it. She was given a Rx for  Crestor to be filled with the [facility] program.  - Advised pt to have Crestor Rx filled  - Once she's been on this for ~2-3 months will repeat lipid panel and LFTs   Type 2 diabetes mellitus without complication Congratulated patient on improvement of A1c to 6.0. - Continue metformin and Amaryl  - If patient's financial issues prohibit her from getting some of her medications, consider discontinuing Amaryl.    No orders of the defined types were placed in this encounter.    Meds ordered this encounter  Medications  . enalapril (VASOTEC) 10 MG tablet    Sig: Take 1 tablet (10 mg total) by mouth daily.    Dispense:  30 tablet    Refill:  3  . hydrochlorothiazide (HYDRODIURIL) 25 MG tablet    Sig: Take 1 tablet (25 mg total) by mouth daily.    Dispense:  30 tablet    Refill:  3    No refills available  . metoprolol succinate (TOPROL-XL) 25 MG 24 hr tablet  Sig: Take 0.5 tablets (12.5 mg total) by mouth daily.    Dispense:  30 tablet    Refill:  2    

## 2014-08-30 NOTE — Assessment & Plan Note (Signed)
BP not at goal, however patient has not taken her HCTZ this AM.  For now, continue taking HCTZ and enalapril, but if she's going to conitinue Lasix in the long-tern, consider discontinuation of HCTZ.

## 2014-08-30 NOTE — Telephone Encounter (Signed)
Olegario Messier from [facility] called to clarify directions for Toprol XL.  Please give her a call at [phone].  Clovis Pu, RN

## 2014-08-30 NOTE — Assessment & Plan Note (Signed)
Patient with continued cough with some mild crackles on exam, however she denies SOB. She has a f/u with cardiology on 11/11.  - continue Lasix, enalapril, and metoprolol - if patient is to continue on Lasix, consider discontinuation of HCTZ and addition of potassium supplement if she's found to be hypokalemic. - patient was very concerned about the idea of a hearth catheterization (as discussed by the cardiology PA who briefly met with hre) in the future, advised her to f/u with cardiology tomorrow to discuss options and benefits/risks.

## 2014-08-31 ENCOUNTER — Ambulatory Visit: Payer: Self-pay | Attending: Cardiology | Admitting: Cardiology

## 2014-08-31 ENCOUNTER — Encounter: Payer: Self-pay | Admitting: Cardiology

## 2014-08-31 DIAGNOSIS — I5041 Acute combined systolic (congestive) and diastolic (congestive) heart failure: Secondary | ICD-10-CM

## 2014-08-31 DIAGNOSIS — I10 Essential (primary) hypertension: Secondary | ICD-10-CM | POA: Insufficient documentation

## 2014-08-31 DIAGNOSIS — F17200 Nicotine dependence, unspecified, uncomplicated: Secondary | ICD-10-CM

## 2014-08-31 DIAGNOSIS — E782 Mixed hyperlipidemia: Secondary | ICD-10-CM | POA: Insufficient documentation

## 2014-08-31 DIAGNOSIS — I5022 Chronic systolic (congestive) heart failure: Secondary | ICD-10-CM | POA: Insufficient documentation

## 2014-08-31 DIAGNOSIS — E119 Type 2 diabetes mellitus without complications: Secondary | ICD-10-CM | POA: Insufficient documentation

## 2014-08-31 DIAGNOSIS — E1169 Type 2 diabetes mellitus with other specified complication: Secondary | ICD-10-CM

## 2014-08-31 DIAGNOSIS — Z7982 Long term (current) use of aspirin: Secondary | ICD-10-CM | POA: Insufficient documentation

## 2014-08-31 DIAGNOSIS — Z720 Tobacco use: Secondary | ICD-10-CM

## 2014-08-31 DIAGNOSIS — F17210 Nicotine dependence, cigarettes, uncomplicated: Secondary | ICD-10-CM | POA: Insufficient documentation

## 2014-08-31 MED ORDER — LOSARTAN POTASSIUM 50 MG PO TABS: 50.0000 mg | ORAL_TABLET | Freq: Every day | ORAL | Status: DC

## 2014-08-31 MED ORDER — ATORVASTATIN CALCIUM 40 MG PO TABS: 40.0000 mg | ORAL_TABLET | Freq: Every day | ORAL | Status: DC

## 2014-08-31 MED ORDER — ENALAPRIL MALEATE 10 MG PO TABS: 10.0000 mg | ORAL_TABLET | Freq: Every day | ORAL | Status: DC

## 2014-08-31 MED ORDER — METOPROLOL SUCCINATE ER 25 MG PO TB24: 12.5000 mg | ORAL_TABLET | Freq: Every day | ORAL | Status: DC

## 2014-08-31 NOTE — Assessment & Plan Note (Signed)
This should improve as we remove more fluid with furosemide.

## 2014-08-31 NOTE — Assessment & Plan Note (Signed)
Continue current medicines but change enalapril to losarten.

## 2014-08-31 NOTE — Assessment & Plan Note (Signed)
She  is diuresing with the addition of furosemide. Will discontinue hydrochlorothiazide. Check metabolic profile in one week follow-up potassium and renal function. We will try to obtain her scales for daily weights.Enalapril may be exacerbating her cough is very problematic for her. We'll change her to losartan 50 mg a day.I will get her back for close follow-up in 2-3 weeks. We may need to get a stress nuclear study with her segmental Lexys Milliner motion abnormalities and possibility of coronary artery disease. At this point, we will not go straight to heart catheterization. Discussed with patient. All questions answered.

## 2014-08-31 NOTE — Progress Notes (Signed)
Left message on VM for pt to pick home scale up at [facility] clinic

## 2014-08-31 NOTE — Progress Notes (Signed)
HPI Grace Ferrell is referred to the cardiology clinic today by family practice to establish with me as her cardiologist.  She was admitted to the hospital recently with 2 weeks of worsening shortness of breath, orthopnea, and edema. Echocardiogram showed an ejection fraction of 30-35% with inferior apical Mariano Doshi motion abnormality. She left AMA before cardiology could see her.  Troponins were negative. EKG showed no acute changes. He was diuresed.  He was seen yesterday in the clinic by Dr. Leonides Schanz. She was started on furosemide 40 mg by mouth every morning. Since then she has diuresed more in her shortness of breath is improved. She is plagued by chronic nonproductive cough particularly at night. She is on enalapril.  Continues to smoke. She has a severe mixed hyperlipidemia and was switched to Crestor. She cannot afford this. We'll put her back on atorvastatin. She also has type 2 diabetes her hemoglobin A1c is 6.0.  She does not have bathroom skills and is not weighing herself.  Past Medical History  Diagnosis Date  . Hypertension   . Diabetes mellitus   . Hypercholesteremia     Current Outpatient Prescriptions  Medication Sig Dispense Refill  . aspirin 81 MG EC tablet Take 81 mg by mouth daily.      Marland Kitchen atorvastatin (LIPITOR) 40 MG tablet Take 1 tablet (40 mg total) by mouth daily. 90 tablet 3  . furosemide (LASIX) 40 MG tablet Take 1 tablet (40 mg total) by mouth daily. 30 tablet 1  . glimepiride (AMARYL) 4 MG tablet Take 1 tablet (4 mg total) by mouth daily with breakfast. 30 tablet 6  . losartan (COZAAR) 50 MG tablet Take 1 tablet (50 mg total) by mouth daily. 90 tablet 3  . metFORMIN (GLUCOPHAGE) 1000 MG tablet Take 1 tablet (1,000 mg total) by mouth 2 (two) times daily with a meal. 60 tablet 1  . metoprolol succinate (TOPROL-XL) 25 MG 24 hr tablet Take 0.5 tablets (12.5 mg total) by mouth daily. 30 tablet 2  . ranitidine (ZANTAC) 150 MG capsule Take 1 capsule (150 mg total) by mouth 2  (two) times daily. 60 capsule 2   No current facility-administered medications for this visit.    Allergies  Allergen Reactions  . Codeine     itching  . Darvocet [Propoxyphene N-Acetaminophen] Nausea And Vomiting  . Morphine And Related     itching  . Sulfamethoxazole-Trimethoprim     REACTION: developed itching 03-28-09 after one dose  . Vicodin [Hydrocodone-Acetaminophen] Itching    History reviewed. No pertinent family history.  History   Social History  . Marital Status: Single    Spouse Name: N/A    Number of Children: N/A  . Years of Education: N/A   Occupational History  . Not on file.   Social History Main Topics  . Smoking status: Current Every Day Smoker -- 0.50 packs/day for 21 years    Types: Cigarettes  . Smokeless tobacco: Not on file  . Alcohol Use: No  . Drug Use: No  . Sexual Activity: Not on file   Other Topics Concern  . Not on file   Social History Narrative    ROS ALL NEGATIVE EXCEPT THOSE NOTED IN HPI  PE  General Appearance: well developed, well nourished in no acute distress HEENT: symmetrical face, PERRLA, good dentition  Neck: no JVD, thyromegaly, or adenopathy, trachea midline Chest: symmetric without deformity Cardiac: PMI non-displaced, RRR, normal S1, S2, no gallop or murmur Lung: decreased breath sounds in the bases with  a few crackles Vascular: all pulses full without bruits  Abdominal: nondistended, nontender, good bowel sounds, no HSM, no bruits Extremities: no cyanosis, clubbing, 1+ pretibial edema, no sign of DVT, no varicosities  Skin: normal color, no rashes Neuro: alert and oriented x 3, non-focal Pysch: normal affect  EKG Not repeated BMET    Component Value Date/Time   NA 142 08/28/2014 0822   K 4.3 08/28/2014 0822   CL 105 08/28/2014 0822   CO2 24 08/28/2014 0822   GLUCOSE 152* 08/28/2014 0822   BUN 9 08/28/2014 0822   CREATININE 0.90 08/28/2014 1625   CREATININE 0.76 12/01/2013 0853   CALCIUM 9.7  08/28/2014 0822   GFRNONAA 73* 08/28/2014 1625   GFRAA 85* 08/28/2014 1625    Lipid Panel     Component Value Date/Time   CHOL 258* 12/01/2013 0853   TRIG 287* 12/01/2013 0853   HDL 37* 12/01/2013 0853   CHOLHDL 7.0 12/01/2013 0853   VLDL 57* 12/01/2013 0853   LDLCALC 164* 12/01/2013 0853    CBC    Component Value Date/Time   WBC 7.2 08/28/2014 1625   WBC 8.8 07/31/2013 1312   RBC 4.26 08/28/2014 1625   RBC 4.95 07/31/2013 1312   HGB 11.1* 08/28/2014 1625   HGB 13.6 07/31/2013 1312   HCT 33.7* 08/28/2014 1625   HCT 42.3 07/31/2013 1312   PLT 221 08/28/2014 1625   MCV 79.1 08/28/2014 1625   MCV 85.5 07/31/2013 1312   MCH 26.1 08/28/2014 1625   MCH 27.5 07/31/2013 1312   MCHC 32.9 08/28/2014 1625   MCHC 32.2 07/31/2013 1312   RDW 16.0* 08/28/2014 1625   LYMPHSABS 2.0 09/29/2007 2355   MONOABS 0.6 09/29/2007 2355   EOSABS 0.4 09/29/2007 2355   BASOSABS 0.0 09/29/2007 2355

## 2014-08-31 NOTE — Assessment & Plan Note (Signed)
I encouraged her to quit to help her heart is stronger and avoid further damage.

## 2014-08-31 NOTE — Assessment & Plan Note (Signed)
We'll change Crestor to atorvastatin 40 mg daily at bedtime. She simply cannot afford brand name. Follow-up comprehensive metabolic profile and fasting lipids in 6 weeks.

## 2014-08-31 NOTE — Patient Instructions (Addendum)
Potassium (K) Potassium is an electrolyte that helps regulate the amount of fluid in the body. It also stimulates muscle contraction and maintains a stable acid-base balance. Most of the body's potassium is inside of cells, and only a very small amount is in the blood. Because the amount in the blood is so small, minor changes can have big effects. PREPARATION FOR TEST Testing for potassium requires taking a blood sample taken by needle from a vein in the arm. The skin is cleaned thoroughly before the sample is drawn. There is no other special preparation needed. NORMAL FINDINGS  Adults: 3.5-5 mEq/L (3.5-5 mmol/L).  Premature neonates, cord blood: 5-10.2 mEq/L (5-10.2 mmol/L).  Premature neonates, 48 hours: 3-6 mEq/L (3-6 mmol/L).  Neonates: 3.7-5.9 mEq/L (3.7-5.9 mmol/L).  Neonates, cord blood: 5.6-12 mEq/L (5.6-12 mmol/L).  Infants: 4.1-5.3 mEq/L (4.1-5.3 mmol/L).  Children: 3.4-4.7 mEq/L (3.4-4.7 mmol/L). Ranges for normal findings may vary among different laboratories and hospitals. You should always check with your doctor after having lab work or other tests done to discuss the meaning of your test results and whether your values are considered within normal limits. MEANING OF TEST Your caregiver will go over the test results with you and discuss the importance and meaning of your results, as well as treatment options and the need for additional tests if necessary. OBTAINING THE TEST RESULTS It is your responsibility to obtain your test results. Ask the lab or department performing the test when and how you will get your results. Document Released: 11/09/2004 Document Revised: 12/30/2011 Document Reviewed: 09/18/2008 [facility] Patient Information 2015 [place], [place]. This information is not intended to replace advice given to you by your health care provider. Make sure you discuss any questions you have with your health care provider.   Thank you for coming to see Dr. Daleen Squibb today It  was a pleasure meeting you today! We will see you in 3 weeks Start taking Atorvastatin 40 mg at bedtime Follow Potassium rich diet We will get you resources for a scale

## 2014-09-07 ENCOUNTER — Ambulatory Visit: Payer: Self-pay | Attending: Cardiology

## 2014-09-07 DIAGNOSIS — I5041 Acute combined systolic (congestive) and diastolic (congestive) heart failure: Secondary | ICD-10-CM

## 2014-09-07 LAB — COMPREHENSIVE METABOLIC PANEL
ALBUMIN: 4.3 g/dL (ref 3.5–5.2)
AST: 12 U/L (ref 0–37)
Alkaline Phosphatase: 95 U/L (ref 39–117)
BUN: 11 mg/dL (ref 6–23)
CALCIUM: 10.2 mg/dL (ref 8.4–10.5)
CO2: 32 meq/L (ref 19–32)
CREATININE: 0.83 mg/dL (ref 0.50–1.10)
Chloride: 96 mEq/L (ref 96–112)
Glucose, Bld: 265 mg/dL — ABNORMAL HIGH (ref 70–99)
POTASSIUM: 3.7 meq/L (ref 3.5–5.3)
Sodium: 137 mEq/L (ref 135–145)
TOTAL PROTEIN: 7.5 g/dL (ref 6.0–8.3)
Total Bilirubin: 0.3 mg/dL (ref 0.2–1.2)

## 2014-09-07 LAB — BASIC METABOLIC PANEL
BUN: 11 mg/dL (ref 6–23)
CALCIUM: 10.2 mg/dL (ref 8.4–10.5)
CO2: 32 meq/L (ref 19–32)
Chloride: 96 mEq/L (ref 96–112)
Creat: 0.83 mg/dL (ref 0.50–1.10)
Glucose, Bld: 265 mg/dL — ABNORMAL HIGH (ref 70–99)
Potassium: 3.7 mEq/L (ref 3.5–5.3)
SODIUM: 137 meq/L (ref 135–145)

## 2014-09-25 ENCOUNTER — Encounter (HOSPITAL_COMMUNITY): Payer: Self-pay | Admitting: Emergency Medicine

## 2014-09-25 ENCOUNTER — Emergency Department (HOSPITAL_COMMUNITY)
Admission: EM | Admit: 2014-09-25 | Discharge: 2014-09-25 | Disposition: A | Discharge status: Home / Self Care | Payer: MEDICAID | Attending: Emergency Medicine | Admitting: Emergency Medicine

## 2014-09-25 DIAGNOSIS — E119 Type 2 diabetes mellitus without complications: Secondary | ICD-10-CM | POA: Insufficient documentation

## 2014-09-25 DIAGNOSIS — Z7982 Long term (current) use of aspirin: Secondary | ICD-10-CM | POA: Insufficient documentation

## 2014-09-25 DIAGNOSIS — L03311 Cellulitis of abdominal wall: Secondary | ICD-10-CM

## 2014-09-25 DIAGNOSIS — E669 Obesity, unspecified: Secondary | ICD-10-CM | POA: Insufficient documentation

## 2014-09-25 DIAGNOSIS — I10 Essential (primary) hypertension: Secondary | ICD-10-CM | POA: Insufficient documentation

## 2014-09-25 DIAGNOSIS — Z720 Tobacco use: Secondary | ICD-10-CM | POA: Insufficient documentation

## 2014-09-25 DIAGNOSIS — Z79899 Other long term (current) drug therapy: Secondary | ICD-10-CM | POA: Insufficient documentation

## 2014-09-25 LAB — BASIC METABOLIC PANEL
ANION GAP: 15 (ref 5–15)
BUN: 11 mg/dL (ref 6–23)
CALCIUM: 9.7 mg/dL (ref 8.4–10.5)
CO2: 27 mEq/L (ref 19–32)
Chloride: 100 mEq/L (ref 96–112)
Creatinine, Ser: 0.65 mg/dL (ref 0.50–1.10)
GFR calc Af Amer: >90 mL/min (ref >90)
Glucose, Bld: 174 mg/dL — ABNORMAL HIGH (ref 70–99)
Potassium: 3.9 mEq/L (ref 3.7–5.3)
Sodium: 142 mEq/L (ref 137–147)

## 2014-09-25 LAB — CBC WITH DIFFERENTIAL/PLATELET
BASOS ABS: 0 10*3/uL (ref 0.0–0.1)
Basophils Relative: 1 % (ref 0–1)
Eosinophils Absolute: 0.2 10*3/uL (ref 0.0–0.7)
Eosinophils Relative: 3 % (ref 0–5)
HCT: 36.7 % (ref 36.0–46.0)
Hemoglobin: 12.2 g/dL (ref 12.0–15.0)
Lymphocytes Relative: 54 % — ABNORMAL HIGH (ref 12–46)
Lymphs Abs: 3.6 10*3/uL (ref 0.7–4.0)
MCH: 26.8 pg (ref 26.0–34.0)
MCHC: 33.2 g/dL (ref 30.0–36.0)
MCV: 80.7 fL (ref 78.0–100.0)
Monocytes Absolute: 0.4 10*3/uL (ref 0.1–1.0)
Monocytes Relative: 6 % (ref 3–12)
NEUTROS ABS: 2.3 10*3/uL (ref 1.7–7.7)
NEUTROS PCT: 36 % — AB (ref 43–77)
Platelets: 246 10*3/uL (ref 150–400)
RBC: 4.55 MIL/uL (ref 3.87–5.11)
RDW: 15.4 % (ref 11.5–15.5)
WBC: 6.4 10*3/uL (ref 4.0–10.5)

## 2014-09-25 MED ORDER — CEPHALEXIN 250 MG PO CAPS
500.0000 mg | ORAL_CAPSULE | Freq: Once | ORAL | Status: AC
  Administered 2014-09-25: 500 mg via ORAL
  Filled 2014-09-25: qty 2

## 2014-09-25 MED ORDER — CEPHALEXIN 500 MG PO CAPS: 500.0000 mg | ORAL_CAPSULE | Freq: Four times a day (QID) | ORAL | Status: DC

## 2014-09-25 MED ORDER — ACETAMINOPHEN 500 MG PO TABS
1000.0000 mg | ORAL_TABLET | Freq: Once | ORAL | Status: AC
  Administered 2014-09-25: 1000 mg via ORAL
  Filled 2014-09-25: qty 2

## 2014-09-25 NOTE — Discharge Instructions (Signed)
Cellulitis Take Tylenol as directed for pain. Take the antibiotic  As prescribed starting today. An appointment has been scheduled for you at the family practice Center so that you may be rechecked for Wednesday, December 9 at 8:30 AM. Return here for fever, vomiting or if you feel worse for any reason Cellulitis is an infection of the skin and the tissue under the skin. The infected area is usually red and tender. This happens most often in the arms and lower legs. HOME CARE   Take your antibiotic medicine as told. Finish the medicine even if you start to feel better.  Keep the infected arm or leg raised (elevated).  Put a warm cloth on the area up to 4 times per day.  Only take medicines as told by your doctor.  Keep all doctor visits as told. GET HELP IF:  You see red streaks on the skin coming from the infected area.  Your red area gets bigger or turns a dark color.  Your bone or joint under the infected area is painful after the skin heals.  Your infection comes back in the same area or different area.  You have a puffy (swollen) bump in the infected area.  You have new symptoms.  You have a fever. GET HELP RIGHT AWAY IF:   You feel very sleepy.  You throw up (vomit) or have watery poop (diarrhea).  You feel sick and have muscle aches and pains. MAKE SURE YOU:   Understand these instructions.  Will watch your condition.  Will get help right away if you are not doing well or get worse. Document Released: 03/25/2008 Document Revised: 02/21/2014 Document Reviewed: 12/23/2011 [facility] Patient Information 2015 [place], [place]. This information is not intended to replace advice given to you by your health care provider. Make sure you discuss any questions you have with your health care provider.

## 2014-09-25 NOTE — ED Notes (Signed)
Pt. Stated, I have pus coming out my navel and it stinks

## 2014-09-25 NOTE — ED Provider Notes (Signed)
CSN: [id number]     Arrival date & time 09/25/14  1006 History   First MD Initiated Contact with Patient 09/25/14 1133     Chief Complaint  Patient presents with  . Abdominal Pain     (Consider location/radiation/quality/duration/timing/severity/associated sxs/prior Treatment) HPI Patient complains of pain and redness at her umbilicus first noticed yesterday. She states she noted a slight amount of clear drainage from her umbilicus yesterday, none today. No treatment prior to coming here. No appetite change. No change in bowel habits. No fever. She does not feel ill. Symptoms worse with changing position pain is mild presently. No other associated symptoms Past Medical History  Diagnosis Date  . Hypertension   . Diabetes mellitus   . Hypercholesteremia    Past Surgical History  Procedure Laterality Date  . Knee surgery    . Tubal ligation    . Endometrial ablation     No family history on file. History  Substance Use Topics  . Smoking status: Current Every Day Smoker -- 0.50 packs/day for 21 years    Types: Cigarettes  . Smokeless tobacco: Not on file  . Alcohol Use: No   OB History    No data available     Review of Systems  Skin: Positive for rash.       Redness and pain at umbilicus  All other systems reviewed and are negative.     Allergies  Codeine; Darvocet; Morphine and related; Sulfamethoxazole-trimethoprim; and Vicodin  Home Medications   Prior to Admission medications   Medication Sig Start Date End Date Taking? Authorizing Provider  aspirin 81 MG EC tablet Take 81 mg by mouth daily.     Yes Historical Provider, MD  furosemide (LASIX) 40 MG tablet Take 1 tablet (40 mg total) by mouth daily. 08/29/14  Yes Elenora Gamma, MD  glimepiride (AMARYL) 4 MG tablet Take 1 tablet (4 mg total) by mouth daily with breakfast. 02/22/14  Yes Lonia Skinner, MD  losartan (COZAAR) 50 MG tablet Take 1 tablet (50 mg total) by mouth daily. 08/31/14  Yes Gaylord Shih, MD   metFORMIN (GLUCOPHAGE) 1000 MG tablet Take 1 tablet (1,000 mg total) by mouth 2 (two) times daily with a meal. 07/30/14  Yes Joanna Puff, MD  metoprolol succinate (TOPROL-XL) 25 MG 24 hr tablet Take 0.5 tablets (12.5 mg total) by mouth daily. 08/31/14  Yes Gaylord Shih, MD  ranitidine (ZANTAC) 150 MG capsule Take 1 capsule (150 mg total) by mouth 2 (two) times daily. 11/23/13  Yes Lonia Skinner, MD  rosuvastatin (CRESTOR) 20 MG tablet Take 40 mg by mouth daily.   Yes Historical Provider, MD  atorvastatin (LIPITOR) 40 MG tablet Take 1 tablet (40 mg total) by mouth daily. 08/31/14   Gaylord Shih, MD   BP 108/46 mmHg  Pulse 77  Temp(Src) 98 F (36.7 C) (Oral)  Resp 22  SpO2 100% Physical Exam  Constitutional: She appears well-developed and well-nourished. No distress.  HENT:  Head: Normocephalic and atraumatic.  Eyes: Conjunctivae are normal. Pupils are equal, round, and reactive to light.  Neck: Neck supple. No tracheal deviation present. No thyromegaly present.  Cardiovascular: Normal rate and regular rhythm.   No murmur heard. Pulmonary/Chest: Effort normal and breath sounds normal.  Abdominal: Soft. Bowel sounds are normal. She exhibits no distension. There is no tenderness.  Obese umbilicus is reddened and mildly tender no drainage no swelling no fluctuance  Musculoskeletal: Normal range of motion. She exhibits no  edema or tenderness.  Neurological: She is alert. Coordination normal.  Skin: Skin is warm and dry. No rash noted.  Psychiatric: She has a normal mood and affect.  Nursing note and vitals reviewed.   ED Course  Procedures (including critical care time) Labs Review Labs Reviewed - No data to display  Imaging Review No results found.   EKG Interpretation None     3:55 PM patient feels improved after treatment with Keflex and Tylenol. Results for orders placed or performed during the hospital encounter of 09/25/14  Basic metabolic panel  Result Value  Ref Range   Sodium 142 137 - 147 mEq/L   Potassium 3.9 3.7 - 5.3 mEq/L   Chloride 100 96 - 112 mEq/L   CO2 27 19 - 32 mEq/L   Glucose, Bld 174 (H) 70 - 99 mg/dL   BUN 11 6 - 23 mg/dL   Creatinine, Ser 4.010.65 0.50 - 1.10 mg/dL   Calcium 9.7 8.4 - 02.710.5 mg/dL   GFR calc non Af Amer >90 >90 mL/min   GFR calc Af Amer >90 >90 mL/min   Anion gap 15 5 - 15  CBC with Differential  Result Value Ref Range   WBC 6.4 4.0 - 10.5 K/uL   RBC 4.55 3.87 - 5.11 MIL/uL   Hemoglobin 12.2 12.0 - 15.0 g/dL   HCT 25.336.7 66.436.0 - 40.346.0 %   MCV 80.7 78.0 - 100.0 fL   MCH 26.8 26.0 - 34.0 pg   MCHC 33.2 30.0 - 36.0 g/dL   RDW 47.415.4 25.911.5 - 56.315.5 %   Platelets 246 150 - 400 K/uL   Neutrophils Relative % 36 (L) 43 - 77 %   Neutro Abs 2.3 1.7 - 7.7 K/uL   Lymphocytes Relative 54 (H) 12 - 46 %   Lymphs Abs 3.6 0.7 - 4.0 K/uL   Monocytes Relative 6 3 - 12 %   Monocytes Absolute 0.4 0.1 - 1.0 K/uL   Eosinophils Relative 3 0 - 5 %   Eosinophils Absolute 0.2 0.0 - 0.7 K/uL   Basophils Relative 1 0 - 1 %   Basophils Absolute 0.0 0.0 - 0.1 K/uL   Dg Chest 2 View  08/29/2014   CLINICAL DATA:  Shortness of breath and cough.  EXAM: CHEST  2 VIEW  COMPARISON:  08/28/2014  FINDINGS: Heart size is within normal limits. There is persistent change of interstitial edema. Small bilateral pleural effusions are present. No new consolidations are identified. Visualized osseous structures have a normal appearance.  IMPRESSION: Persistent interstitial pulmonary edema.   Electronically Signed   By: Rosalie GumsBeth  Brown M.D.   On: 08/29/2014 07:24   Dg Chest 2 View  08/28/2014   CLINICAL DATA:  Cough and shortness of breath for 2 weeks. No fever. History of diabetes and hypertension.  EXAM: CHEST  2 VIEW  COMPARISON:  11/23/2013; 07/31/2013  FINDINGS: Apparent mild enlargement of the cardiac silhouette. The pulmonary vasculature appears less distinct the present examination with cephalization of flow. Minimal pleural parenchymal thickening about the  right minor and bilateral major fissures. Interval development trace bilateral effusions with associated worsening bibasilar heterogeneous opacities. Unchanged bones.  IMPRESSION: 1. Suspected mild/early pulmonary edema with trace bilateral effusions and associated bibasilar opacities, likely atelectasis. 2. Apparent minimal enlargement of the cardiac silhouette, possibly accentuated due to inspiratory effort though could be seen in the setting of a pericardial effusion. Further evaluation with cardiac echo could be performed as clinically indicated.   Electronically Signed   By:  Simonne Come M.D.   On: 08/28/2014 09:06   Ct Angio Chest Pe W/cm &/or Wo Cm  08/28/2014   CLINICAL DATA:  Mid sternal chest pain for 3 weeks, cough, pleuritic chest pain  EXAM: CT ANGIOGRAPHY CHEST WITH CONTRAST  TECHNIQUE: Multidetector CT imaging of the chest was performed using the standard protocol during bolus administration of intravenous contrast. Multiplanar CT image reconstructions and MIPs were obtained to evaluate the vascular anatomy.  CONTRAST:  OMNIPAQUE IOHEXOL 350 MG/ML SOLN  COMPARISON:  CT 09/30/2007  FINDINGS: The examination is adequate for evaluation for acute pulmonary embolism up to and including the 3rd order pulmonary arteries. No focal filling defect is identified up to and including the 3rd order pulmonary arteries to suggest acute pulmonary embolism.  Small pleural effusions are noted. Prominence of mediastinal and hilar nodes is identified with representative 1 cm pretracheal node identified image 30 and right hilar node 0.7 cm image 47. Great vessels are normal in caliber. Hypodense 2.3 cm isthmus thyroid nodule reidentified image 6. Mild cardiomegaly noted. Trace pericardial fluid.  Minimal subpleural scarring or atelectasis. A few areas of patchy ground-glass airspace opacity at the lung bases are noted. Mild central bronchial wall thickening.  No acute osseous abnormality.  At incomplete imaging of  the upper abdomen, again noted is an ill-defined 2.8 cm left hepatic lobe lesion image 83. This is incompletely characterized but stable since 2008 and therefore highly likely to be benign.  Review of the MIP images confirms the above findings.  IMPRESSION: Pleural effusions and areas of ground-glass airspace opacity and bronchial wall thickening likely indicating mild pulmonary edema. Probable reactive lymph nodes in the mediastinum and hilum.  No CT evidence for acute pulmonary embolism.   Electronically Signed   By: Christiana Pellant M.D.   On: 08/28/2014 10:44    MDM  Plan prescription Keflex. Tylenol for discomfort. An appointment has been scheduled for the patient at the family practice clinic for Wednesday, December 9 at 8:30 AM  Final diagnoses:  None   diagnosis #1 cellulitis of abdominal wall #2 hyperglycemia    Doug Sou, MD 09/25/14 [phone]

## 2014-09-28 ENCOUNTER — Ambulatory Visit: Payer: Self-pay | Admitting: Family Medicine

## 2014-09-28 ENCOUNTER — Ambulatory Visit: Payer: Self-pay | Admitting: Cardiology

## 2014-10-03 ENCOUNTER — Encounter: Payer: Self-pay | Admitting: Family Medicine

## 2014-10-03 ENCOUNTER — Ambulatory Visit (INDEPENDENT_AMBULATORY_CARE_PROVIDER_SITE_OTHER): Payer: Self-pay | Admitting: Family Medicine

## 2014-10-03 VITALS — BP 142/72 | HR 78 | Temp 98.3°F | Ht 60.0 in | Wt 167.0 lb

## 2014-10-03 DIAGNOSIS — I5022 Chronic systolic (congestive) heart failure: Secondary | ICD-10-CM

## 2014-10-03 DIAGNOSIS — Z139 Encounter for screening, unspecified: Secondary | ICD-10-CM

## 2014-10-03 MED ORDER — METOPROLOL SUCCINATE ER 25 MG PO TB24: 12.5000 mg | ORAL_TABLET | Freq: Every day | ORAL | Status: DC

## 2014-10-03 MED ORDER — FUROSEMIDE 40 MG PO TABS: 40.0000 mg | ORAL_TABLET | Freq: Every day | ORAL | Status: DC

## 2014-10-03 NOTE — Progress Notes (Signed)
[facility]  Joanna Puff, MD  Subjective:  Chief complaint: CHF  [name] is presenting for a follow-up of her CHF (ejection fraction of 30-35 percent with inferior apical wall motion abnormality). She followed up with cardiology, at which time her hydrochlorothiazide was held and her enalapril was changed to losartan due to a dry cough. She has continued to take metoprolol, Lasix, and aspirin daily. The patient states that her cough began to resolve a few days after discontinuing the enalapril. She denies any shortness of breath, cough, lower extremity swelling, paroxysmal nocturnal dyspnea. She has stable two-pillow orthopnea. She is now able to walk greater than 1 mile without shortness of breath. Additionally, the patient's Crestor was switched by cardiology to atorvastatin and hopes that it would be less expensive. The patient continues to be part of the [facility] program but feels that some of her medications may be cheaper at a commercial pharmacy so she will start to research that. She has a follow-up appointment with Dr. Daleen Squibb tomorrow.  Additionally, the patient was seen in the emergency room on December 6 and was found to have cellulitis around her umbilicus. The patient states that he erythema and pain has resolved. She has 2 Keflex remaining. No fevers, chills, or drainage.  Will write a Rx for a mammogram  Past Medical History Patient Active Problem List   Diagnosis Date Noted  . Cough   . Dyspnea   . Pleuritic chest pain   . Essential hypertension   . Type 2 diabetes mellitus without complication   . Chronic systolic heart failure 08/28/2014  . Acute sinusitis, unspecified 11/24/2013  . Dysuria 06/17/2013  . Menopause 04/11/2012  . Chronic sinusitis 04/11/2012  . Mixed hyperlipidemia due to type 2 diabetes mellitus 03/12/2012  . Chronic pain syndrome 05/21/2011  . GOITER, UNSPECIFIED 01/08/2010  . TOBACCO USER 07/29/2009  . DIABETES MELLITUS II, UNCOMPLICATED  12/18/2006  . DEPRESSIVE DISORDER, NOS 12/18/2006    Medications- reviewed and updated Current Outpatient Prescriptions  Medication Sig Dispense Refill  . aspirin 81 MG EC tablet Take 81 mg by mouth daily.      Marland Kitchen atorvastatin (LIPITOR) 40 MG tablet Take 1 tablet (40 mg total) by mouth daily. 90 tablet 3  . cephALEXin (KEFLEX) 500 MG capsule Take 1 capsule (500 mg total) by mouth 4 (four) times daily. 28 capsule 0  . furosemide (LASIX) 40 MG tablet Take 1 tablet (40 mg total) by mouth daily. 30 tablet 2  . glimepiride (AMARYL) 4 MG tablet Take 1 tablet (4 mg total) by mouth daily with breakfast. 30 tablet 6  . losartan (COZAAR) 50 MG tablet Take 1 tablet (50 mg total) by mouth daily. 90 tablet 3  . metFORMIN (GLUCOPHAGE) 1000 MG tablet Take 1 tablet (1,000 mg total) by mouth 2 (two) times daily with a meal. 60 tablet 1  . metoprolol succinate (TOPROL-XL) 25 MG 24 hr tablet Take 0.5 tablets (12.5 mg total) by mouth daily. 15 tablet 2  . ranitidine (ZANTAC) 150 MG capsule Take 1 capsule (150 mg total) by mouth 2 (two) times daily. 60 capsule 2   No current facility-administered medications for this visit.    Objective: BP 142/72 mmHg  Pulse 78  Temp(Src) 98.3 F (36.8 C) (Oral)  Ht 5' (1.524 m)  Wt 75.751 kg (167 lb)  BMI 32.62 kg/m2  Repeat 130/ 70 Gen: No acute distress. Alert, cooperative with exam HEENT: Atraumatic, Oropharynx clear. MMM CV: RRR. No murmurs, rubs, or  gallops noted. 2+ radial pulses bilaterally. Resp: CTAB. No wheezing, crackles, or rhonchi noted. Ext: No pitting edema noted. No gross deformities. Abd: Soft, nontender, nondistended. No erythema, swelling, or drainage around the umbilicus Neuro: Alert and oriented, No gross focal deficits.    Assessment/Plan:  Chronic systolic heart failure The patient's cough has resolved completely after the discontinuation of enalapril and the addition of losartan. She is currently asymptomatic and has no signs of fluid  overload on exam. Of note, the patient's weight has increased in the last month from 162 pounds to 167 pounds. She is working to obtain a scale to establish a baseline dry weight. -Continue losartan 50 mg, Lasix 20 mg, metoprolol 12.5 mg, and aspirin 81 mg -Due to the hypokinesis of the mid and apical inferior myocardium, cardiology is considering a nuclear stress test -Patient to follow-up with Dr. Daleen Squibbwall tomorrow, 10-04-14 -Prescriptions printed for the Toprol and Lasix the patient may get them from [facility]    Orders Placed This Encounter  Procedures  . MM DIGITAL SCREENING BILATERAL    Standing Status: Future     Number of Occurrences:      Standing Expiration Date: 12/05/2015    Order Specific Question:  Reason for Exam (SYMPTOM  OR DIAGNOSIS REQUIRED)    Answer:  Screening    Order Specific Question:  Is the patient pregnant?    Answer:  No    Order Specific Question:  Preferred imaging location?    [facility]    Meds ordered this encounter  Medications  . furosemide (LASIX) 40 MG tablet    Sig: Take 1 tablet (40 mg total) by mouth daily.    Dispense:  30 tablet    Refill:  2  . metoprolol succinate (TOPROL-XL) 25 MG 24 hr tablet    Sig: Take 0.5 tablets (12.5 mg total) by mouth daily.    Dispense:  15 tablet    Refill:  2

## 2014-10-03 NOTE — Assessment & Plan Note (Signed)
The patient's cough has resolved completely after the discontinuation of enalapril and the addition of losartan. She is currently asymptomatic and has no signs of fluid overload on exam. Of note, the patient's weight has increased in the last month from 162 pounds to 167 pounds. She is working to obtain a scale to establish a baseline dry weight. -Continue losartan 50 mg, Lasix 20 mg, metoprolol 12.5 mg, and aspirin 81 mg -Due to the hypokinesis of the mid and apical inferior myocardium, cardiology is considering a nuclear stress test -Patient to follow-up with Dr. Daleen Squibb tomorrow, 10-04-14 -Prescriptions printed for the Toprol and Lasix the patient may get them from [facility]

## 2014-10-03 NOTE — Patient Instructions (Signed)
It was good to see you again, I'm glad you're feeling better I placed a referral for a mammogram, they should contact you to set up an appointment. Please seek medical care if you notice you start having shortness of breath or cough.  Contact the clinic or the cardiologist if you notice more swelling in your legs or you notice that your weight has increased significantly

## 2014-10-05 ENCOUNTER — Encounter: Payer: Self-pay | Admitting: Cardiology

## 2014-10-05 ENCOUNTER — Ambulatory Visit: Payer: Self-pay | Attending: Cardiology | Admitting: Cardiology

## 2014-10-05 VITALS — BP 150/83 | HR 79 | Temp 98.4°F | Resp 20 | Ht 62.0 in | Wt 162.0 lb

## 2014-10-05 DIAGNOSIS — I5022 Chronic systolic (congestive) heart failure: Secondary | ICD-10-CM

## 2014-10-05 DIAGNOSIS — E782 Mixed hyperlipidemia: Secondary | ICD-10-CM

## 2014-10-05 DIAGNOSIS — F17200 Nicotine dependence, unspecified, uncomplicated: Secondary | ICD-10-CM

## 2014-10-05 DIAGNOSIS — Z720 Tobacco use: Secondary | ICD-10-CM

## 2014-10-05 DIAGNOSIS — E1169 Type 2 diabetes mellitus with other specified complication: Secondary | ICD-10-CM

## 2014-10-05 DIAGNOSIS — I10 Essential (primary) hypertension: Secondary | ICD-10-CM

## 2014-10-05 LAB — LIPID PANEL
Cholesterol: 173 mg/dL (ref 0–200)
HDL: 40 mg/dL (ref >39)
LDL Cholesterol: 103 mg/dL — ABNORMAL HIGH (ref 0–99)
TRIGLYCERIDES: 151 mg/dL — AB (ref <150)
Total CHOL/HDL Ratio: 4.3 Ratio
VLDL: 30 mg/dL (ref 0–40)

## 2014-10-05 LAB — COMPLETE METABOLIC PANEL WITH GFR
AST: 18 U/L (ref 0–37)
Albumin: 4.6 g/dL (ref 3.5–5.2)
Alkaline Phosphatase: 103 U/L (ref 39–117)
BUN: 11 mg/dL (ref 6–23)
CO2: 32 mEq/L (ref 19–32)
CREATININE: 0.77 mg/dL (ref 0.50–1.10)
Calcium: 10.3 mg/dL (ref 8.4–10.5)
Chloride: 98 mEq/L (ref 96–112)
GFR, Est Non African American: >89 mL/min
Glucose, Bld: 172 mg/dL — ABNORMAL HIGH (ref 70–99)
Potassium: 3.5 mEq/L (ref 3.5–5.3)
Sodium: 141 mEq/L (ref 135–145)
Total Bilirubin: 0.7 mg/dL (ref 0.2–1.2)
Total Protein: 7.5 g/dL (ref 6.0–8.3)

## 2014-10-05 MED ORDER — LOSARTAN POTASSIUM 50 MG PO TABS: 50.0000 mg | ORAL_TABLET | Freq: Every day | ORAL | Status: DC

## 2014-10-05 NOTE — Assessment & Plan Note (Signed)
She has cut back from 2-1/2 packs a day to 5 cigarettes a day. She'll continue to try to quit but this is much better in terms of risk as I discussed with her today.

## 2014-10-05 NOTE — Assessment & Plan Note (Signed)
Patient says she is compliant with her atorvastatin. He's also lost some weight. Her hemoglobin A1c is at goal. We will check fasting lipids this morning as well as LFTs. I suspect her LDL will be a goal less than 100. If so, no change in treatment.

## 2014-10-05 NOTE — Assessment & Plan Note (Signed)
She is remarkably stable and compliant. Continue metoprolol, losartan which we will refill today, low-dose furosemide, and aspirin. We'll also obtain scales with daily weights and self-management of her furosemide. We'll repeat echocardiogram in 3 months to see LV recovery which I'm optimistic about. This encouragement was discussed with the patient.

## 2014-10-05 NOTE — Progress Notes (Signed)
Grace Ferrell returns today for evaluation and management of her chronic systolic heart failure. She is doing remarkably well and is very compliant with her regimen. She does not have bathroom skills and therefore is not weighing herself daily.  She is taking all of her meds. She did run out of her losartan 3 days ago. Her blood sugars under excellent control. She is due fasting lipids.  She denies any orthopnea, PND or edema. Her weight is actually less than last visit.  Exam she is in no acute distress, very pleasant. Neck shows no JVD carotid upstrokes are equal bilaterally without bruits. Heart exam reveals a normal S1-S2 with no gallop or murmur. Lungs are clear to auscultation. Abdominal exam is nondistended. Extremity reveal no edema. Pulses are intact. Neuro exam is grossly intact. She is pleasant and appropriate.

## 2014-10-05 NOTE — Assessment & Plan Note (Signed)
Diabetes under excellent control with hemoglobin A1c less than 7%. Continue current medications. No lab work today.

## 2014-10-05 NOTE — Progress Notes (Signed)
Patient has history of chronic systolic heart failure, hypertension, diabetes mellitus type II Denies swelling, shortness of breath, chest pain, headaches. Exercising daily Smoker-has cut back to 1/4ppd from 1/2 ppd Needs refill of Losartan-ran out 3 days ago Scale needed-not weighing herself daily.

## 2014-10-05 NOTE — Patient Instructions (Addendum)
It was great seeing you today. Continue daily exercise and continue working on smoking cessation. You will be given a scale. Weigh yourself every morning. If your weight increases more than 3 lbs, take an additional Lasix that day. Labs will be drawn today; we will call you with the results. Thanks so much!

## 2014-10-05 NOTE — Assessment & Plan Note (Signed)
Not under good control today. Patient ran out of her losartan. Medication renewed today and patient urged to be compliant.

## 2014-10-12 ENCOUNTER — Other Ambulatory Visit: Payer: Self-pay

## 2014-10-21 ENCOUNTER — Inpatient Hospital Stay (HOSPITAL_COMMUNITY)
Admission: EM | Admit: 2014-10-21 | Discharge: 2014-10-23 | DRG: 293 | Disposition: A | Discharge status: Home / Self Care | Payer: MEDICAID | Attending: Family Medicine | Admitting: Family Medicine

## 2014-10-21 ENCOUNTER — Encounter (HOSPITAL_COMMUNITY): Payer: Self-pay | Admitting: Emergency Medicine

## 2014-10-21 ENCOUNTER — Emergency Department (HOSPITAL_COMMUNITY): Payer: MEDICAID

## 2014-10-21 DIAGNOSIS — I5022 Chronic systolic (congestive) heart failure: Secondary | ICD-10-CM

## 2014-10-21 DIAGNOSIS — I509 Heart failure, unspecified: Secondary | ICD-10-CM

## 2014-10-21 DIAGNOSIS — I5023 Acute on chronic systolic (congestive) heart failure: Secondary | ICD-10-CM | POA: Diagnosis present

## 2014-10-21 DIAGNOSIS — Z7982 Long term (current) use of aspirin: Secondary | ICD-10-CM

## 2014-10-21 DIAGNOSIS — E1169 Type 2 diabetes mellitus with other specified complication: Secondary | ICD-10-CM | POA: Diagnosis present

## 2014-10-21 DIAGNOSIS — E782 Mixed hyperlipidemia: Secondary | ICD-10-CM

## 2014-10-21 DIAGNOSIS — Z720 Tobacco use: Secondary | ICD-10-CM

## 2014-10-21 DIAGNOSIS — I5043 Acute on chronic combined systolic (congestive) and diastolic (congestive) heart failure: Principal | ICD-10-CM

## 2014-10-21 DIAGNOSIS — I059 Rheumatic mitral valve disease, unspecified: Secondary | ICD-10-CM | POA: Diagnosis present

## 2014-10-21 DIAGNOSIS — E780 Pure hypercholesterolemia: Secondary | ICD-10-CM | POA: Diagnosis present

## 2014-10-21 DIAGNOSIS — Z888 Allergy status to other drugs, medicaments and biological substances status: Secondary | ICD-10-CM

## 2014-10-21 DIAGNOSIS — I420 Dilated cardiomyopathy: Secondary | ICD-10-CM | POA: Diagnosis present

## 2014-10-21 DIAGNOSIS — I351 Nonrheumatic aortic (valve) insufficiency: Secondary | ICD-10-CM | POA: Diagnosis present

## 2014-10-21 DIAGNOSIS — I340 Nonrheumatic mitral (valve) insufficiency: Secondary | ICD-10-CM | POA: Diagnosis present

## 2014-10-21 DIAGNOSIS — E119 Type 2 diabetes mellitus without complications: Secondary | ICD-10-CM

## 2014-10-21 DIAGNOSIS — I10 Essential (primary) hypertension: Secondary | ICD-10-CM

## 2014-10-21 DIAGNOSIS — Z882 Allergy status to sulfonamides status: Secondary | ICD-10-CM

## 2014-10-21 LAB — GLUCOSE, CAPILLARY
GLUCOSE-CAPILLARY: 120 mg/dL — AB (ref 70–99)
Glucose-Capillary: 124 mg/dL — ABNORMAL HIGH (ref 70–99)
Glucose-Capillary: 218 mg/dL — ABNORMAL HIGH (ref 70–99)
Glucose-Capillary: 292 mg/dL — ABNORMAL HIGH (ref 70–99)

## 2014-10-21 LAB — BRAIN NATRIURETIC PEPTIDE: B NATRIURETIC PEPTIDE 5: 424.8 pg/mL — AB (ref 0.0–100.0)

## 2014-10-21 LAB — BASIC METABOLIC PANEL
ANION GAP: 11 (ref 5–15)
BUN: 10 mg/dL (ref 6–23)
CO2: 24 mmol/L (ref 19–32)
Calcium: 9.1 mg/dL (ref 8.4–10.5)
Chloride: 105 mEq/L (ref 96–112)
Creatinine, Ser: 0.79 mg/dL (ref 0.50–1.10)
GFR calc Af Amer: >90 mL/min (ref >90)
Glucose, Bld: 197 mg/dL — ABNORMAL HIGH (ref 70–99)
POTASSIUM: 3.4 mmol/L — AB (ref 3.5–5.1)
SODIUM: 140 mmol/L (ref 135–145)

## 2014-10-21 LAB — CBC
HCT: 35.7 % — ABNORMAL LOW (ref 36.0–46.0)
Hemoglobin: 11.8 g/dL — ABNORMAL LOW (ref 12.0–15.0)
MCH: 26.5 pg (ref 26.0–34.0)
MCHC: 33.1 g/dL (ref 30.0–36.0)
MCV: 80 fL (ref 78.0–100.0)
Platelets: 256 10*3/uL (ref 150–400)
RBC: 4.46 MIL/uL (ref 3.87–5.11)
RDW: 15.3 % (ref 11.5–15.5)
WBC: 9.6 10*3/uL (ref 4.0–10.5)

## 2014-10-21 LAB — TROPONIN I
Troponin I: <0.03 ng/mL (ref <0.031)
Troponin I: <0.03 ng/mL (ref <0.031)

## 2014-10-21 IMAGING — CR DG CHEST 2V
2 series · 2 of 2 positions shown · non-contrast
Comparison: Chest radiograph [DATE]

CLINICAL DATA: Headache, chest pain and weakness for 2 hr.

EXAM:
CHEST  2 VIEW

[chest pa]
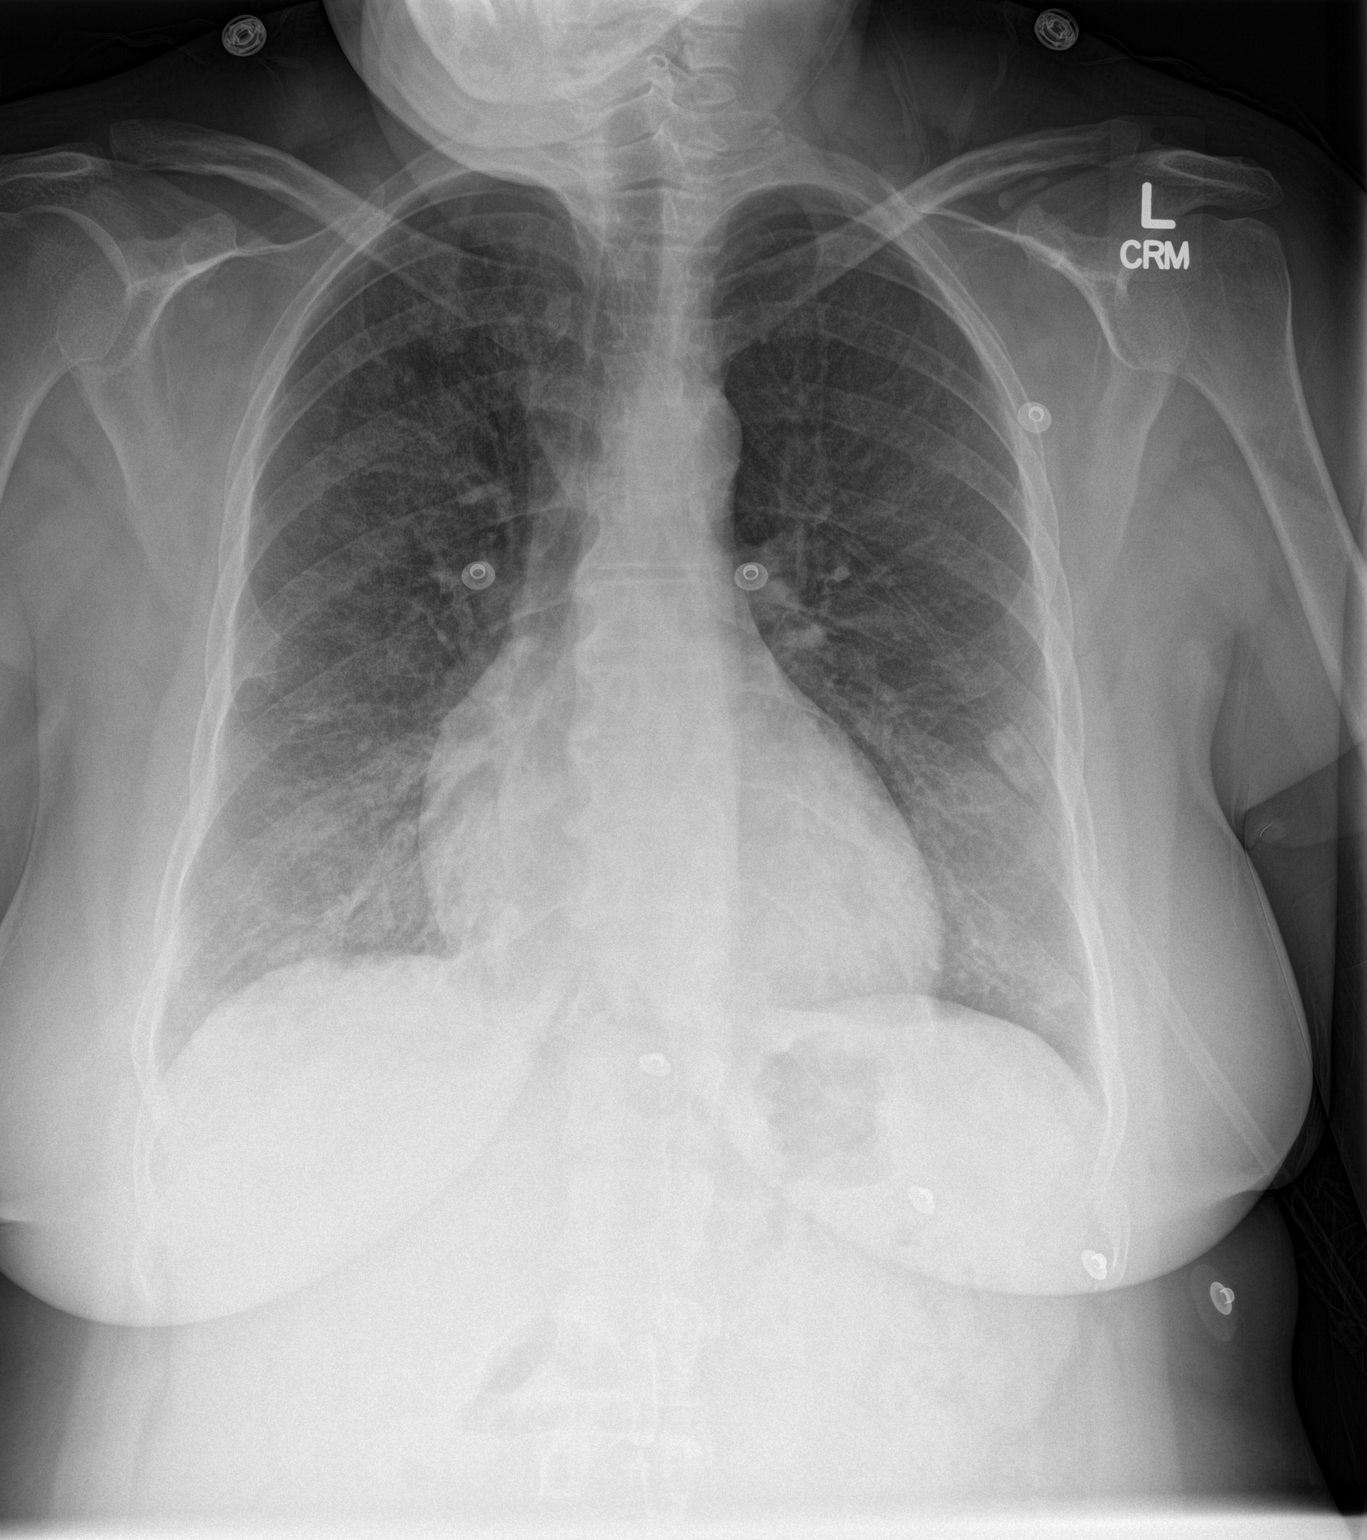

[chest lat]
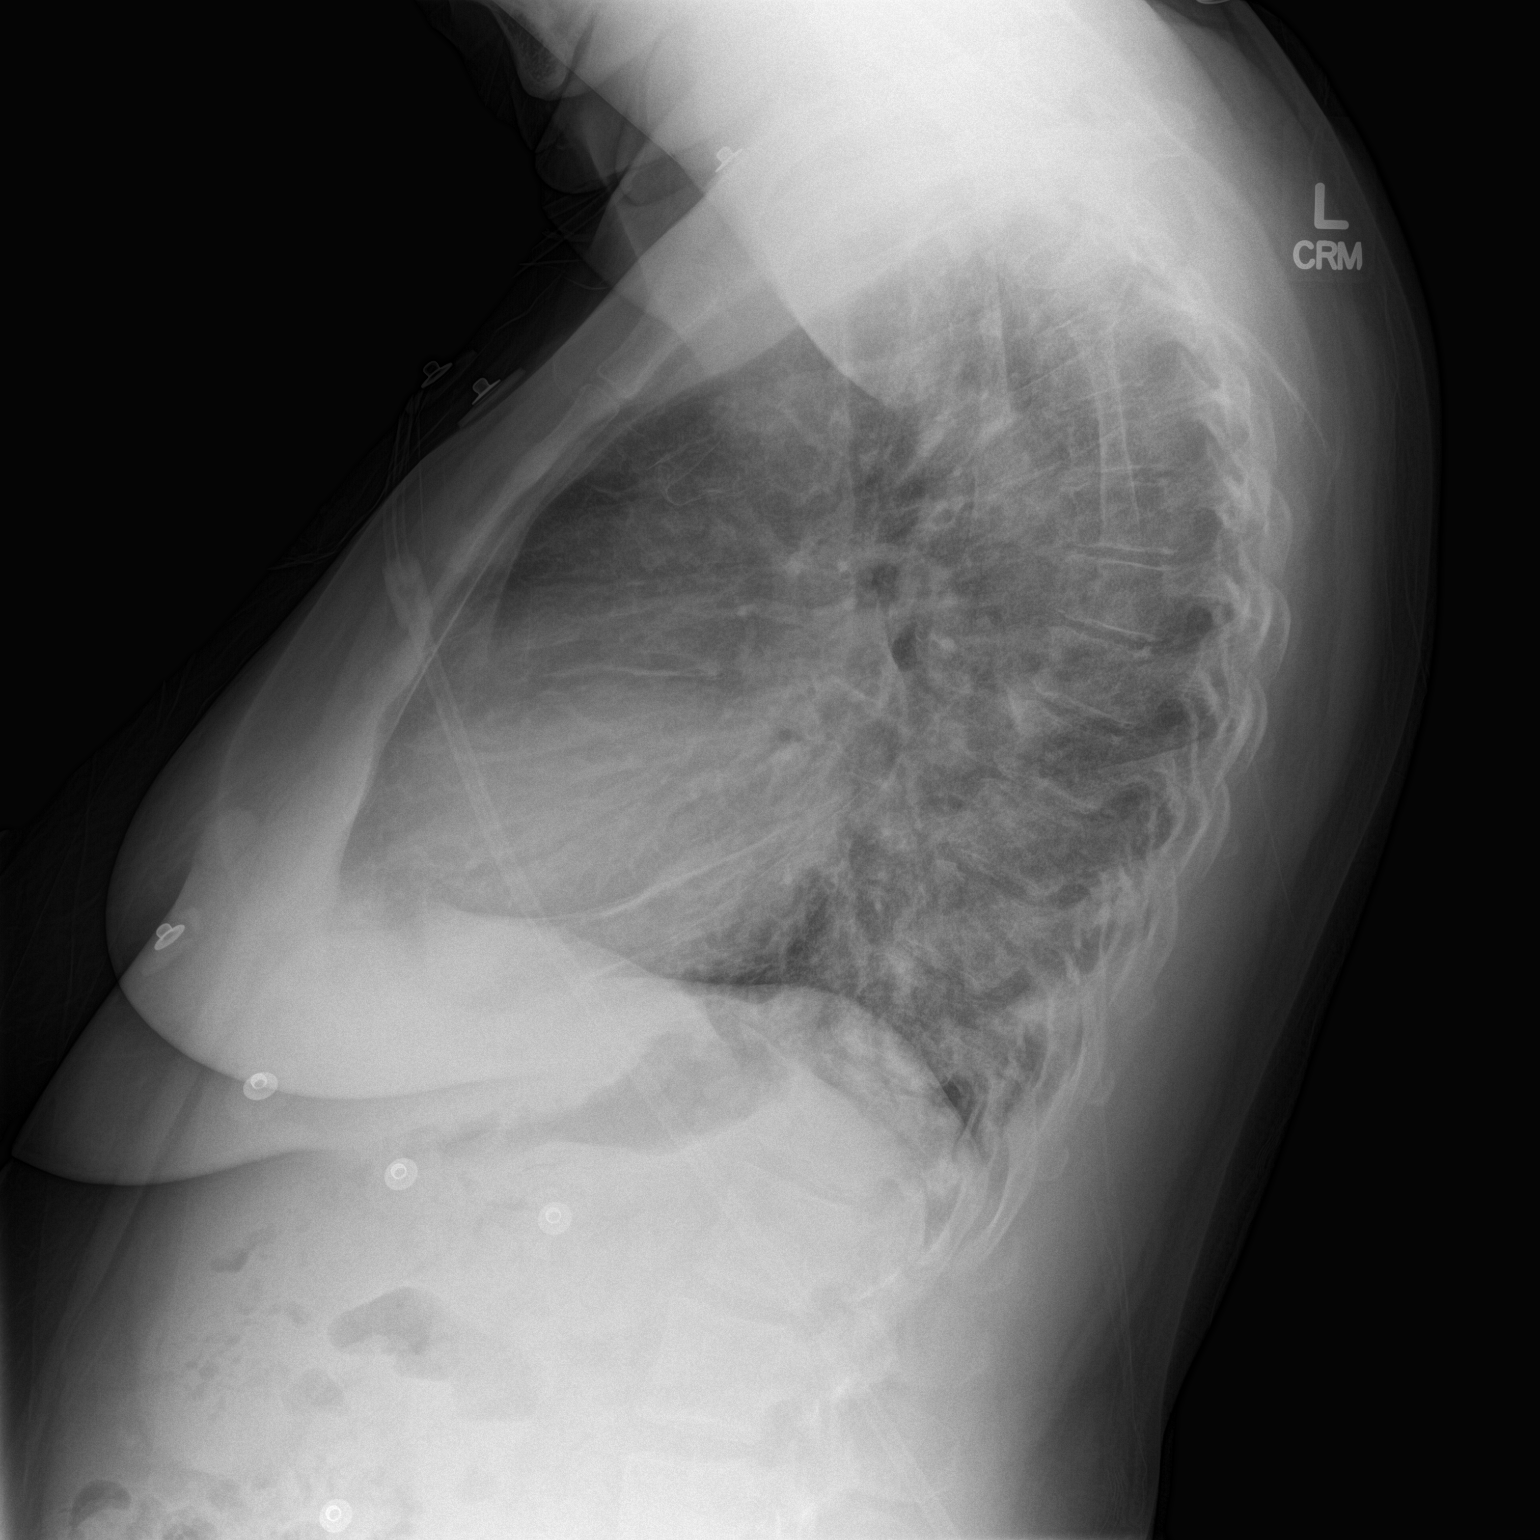

[2 of 2 positions shown; findings below may reference images not displayed]

FINDINGS: Diffuse mild interstitial prominence, increased from prior
examination. No pleural effusions or focal consolidations. Strandy
densities RIGHT lung base. Cardiac silhouette is mildly enlarged,
mediastinal silhouette is nonsuspicious. Soft tissue planes and
include osseous structures are non acute ; probable loose body about
the LEFT shoulder. Mild degenerative change of the thoracic spine.
IMPRESSION: Increasing mild interstitial prominence which may reflect pulmonary
edema or possibly atypical infection. RIGHT lung base atelectasis.

  By: BLAIN

## 2014-10-21 MED ORDER — SODIUM CHLORIDE 0.9 % IJ SOLN
3.0000 mL | Freq: Two times a day (BID) | INTRAMUSCULAR | Status: DC
Start: 2014-10-21 — End: 2014-10-23
  Administered 2014-10-21 – 2014-10-22 (×3): 3 mL via INTRAVENOUS

## 2014-10-21 MED ORDER — SODIUM CHLORIDE 0.9 % IV SOLN: 250.0000 mL | INTRAVENOUS | Status: DC | PRN

## 2014-10-21 MED ORDER — ONDANSETRON HCL 4 MG/2ML IJ SOLN: 4.0000 mg | Freq: Four times a day (QID) | INTRAMUSCULAR | Status: DC | PRN

## 2014-10-21 MED ORDER — FUROSEMIDE 10 MG/ML IJ SOLN
40.0000 mg | Freq: Once | INTRAMUSCULAR | Status: AC
  Administered 2014-10-21: 40 mg via INTRAVENOUS
  Filled 2014-10-21: qty 4

## 2014-10-21 MED ORDER — SODIUM CHLORIDE 0.9 % IJ SOLN: 3.0000 mL | INTRAMUSCULAR | Status: DC | PRN

## 2014-10-21 MED ORDER — FAMOTIDINE 20 MG PO TABS
20.0000 mg | ORAL_TABLET | Freq: Two times a day (BID) | ORAL | Status: DC
  Administered 2014-10-21 – 2014-10-23 (×3): 20 mg via ORAL
  Filled 2014-10-21 (×6): qty 1

## 2014-10-21 MED ORDER — LOSARTAN POTASSIUM 50 MG PO TABS
50.0000 mg | ORAL_TABLET | Freq: Every day | ORAL | Status: DC
  Administered 2014-10-21 – 2014-10-23 (×3): 50 mg via ORAL
  Filled 2014-10-21 (×3): qty 1

## 2014-10-21 MED ORDER — ONDANSETRON HCL 4 MG PO TABS: 4.0000 mg | ORAL_TABLET | Freq: Four times a day (QID) | ORAL | Status: DC | PRN

## 2014-10-21 MED ORDER — CARVEDILOL 6.25 MG PO TABS
6.2500 mg | ORAL_TABLET | Freq: Two times a day (BID) | ORAL | Status: DC
  Administered 2014-10-21: 6.25 mg via ORAL
  Filled 2014-10-21 (×6): qty 1

## 2014-10-21 MED ORDER — ATORVASTATIN CALCIUM 40 MG PO TABS
40.0000 mg | ORAL_TABLET | Freq: Every day | ORAL | Status: DC
  Administered 2014-10-21 – 2014-10-22 (×2): 40 mg via ORAL
  Filled 2014-10-21 (×3): qty 1

## 2014-10-21 MED ORDER — ASPIRIN EC 81 MG PO TBEC
81.0000 mg | DELAYED_RELEASE_TABLET | Freq: Every day | ORAL | Status: DC
  Administered 2014-10-21 – 2014-10-23 (×3): 81 mg via ORAL
  Filled 2014-10-21 (×3): qty 1

## 2014-10-21 MED ORDER — INSULIN ASPART 100 UNIT/ML ~~LOC~~ SOLN
0.0000 [IU] | Freq: Three times a day (TID) | SUBCUTANEOUS | Status: DC
  Administered 2014-10-21: 3 [IU] via SUBCUTANEOUS
  Administered 2014-10-22: 2 [IU] via SUBCUTANEOUS

## 2014-10-21 MED ORDER — DIPHENHYDRAMINE HCL 50 MG/ML IJ SOLN
25.0000 mg | Freq: Once | INTRAMUSCULAR | Status: AC
  Administered 2014-10-21: 25 mg via INTRAVENOUS
  Filled 2014-10-21: qty 1

## 2014-10-21 MED ORDER — FUROSEMIDE 10 MG/ML IJ SOLN
40.0000 mg | Freq: Two times a day (BID) | INTRAMUSCULAR | Status: DC
  Administered 2014-10-22: 40 mg via INTRAVENOUS
  Filled 2014-10-21 (×3): qty 4

## 2014-10-21 MED ORDER — SODIUM CHLORIDE 0.9 % IJ SOLN
3.0000 mL | Freq: Two times a day (BID) | INTRAMUSCULAR | Status: DC
  Administered 2014-10-21: 3 mL via INTRAVENOUS

## 2014-10-21 MED ORDER — METOPROLOL SUCCINATE 12.5 MG HALF TABLET
12.5000 mg | ORAL_TABLET | Freq: Every day | ORAL | Status: DC
  Administered 2014-10-21: 12.5 mg via ORAL
  Filled 2014-10-21: qty 1

## 2014-10-21 MED ORDER — HEPARIN SODIUM (PORCINE) 5000 UNIT/ML IJ SOLN
5000.0000 [IU] | Freq: Three times a day (TID) | INTRAMUSCULAR | Status: DC
Start: 2014-10-21 — End: 2014-10-23
  Administered 2014-10-21 – 2014-10-22 (×6): 5000 [IU] via SUBCUTANEOUS
  Filled 2014-10-21 (×10): qty 1

## 2014-10-21 MED ORDER — METOCLOPRAMIDE HCL 5 MG/ML IJ SOLN
10.0000 mg | Freq: Once | INTRAMUSCULAR | Status: AC
Start: 2014-10-21 — End: 2014-10-21
  Administered 2014-10-21: 10 mg via INTRAVENOUS
  Filled 2014-10-21: qty 2

## 2014-10-21 NOTE — ED Provider Notes (Signed)
CSN: [id number]     Arrival date & time 10/21/14  4098 History  This chart was scribed for Grace Mackie, MD by Tonye Royalty, ED Scribe. This patient was seen in room A11C/A11C and the patient's care was started at 4:06 AM.    Chief Complaint  Patient presents with  . Headache  . Dizziness  . Shortness of Breath   HPI HPI Comments: Grace Ferrell is a 49 y.o. female who presents to the Emergency Department complaining of cough, SOB, and headache with onset at 1600 yesterday after waking from a nap. She states she woke feeling faint, then started coughing, then had onset of nausea. She states she did not pass out though she felt like she would. She states she was feeling fine earlier in the day. She states her cough produces mucous and streaks of blood. She also reports associated swelling in her hands. She reports history CHF, HTN, diabetes type II. She states she takes  Lasix for CHF and her cardiologist is Dr. Valera Castle. She denies history of asthma, COPD, or emphysema. She states she smokes 1/2 ppd. She denies particular history of headaches. She denies fever or leg swelling.  Patient noted to be hypoxic, does not normally wear oxygen.  No fevers or chills.  Cough is mainly nonproductive.   Past Medical History  Diagnosis Date  . Hypertension   . Diabetes mellitus   . Hypercholesteremia    Past Surgical History  Procedure Laterality Date  . Knee surgery    . Tubal ligation    . Endometrial ablation     History reviewed. No pertinent family history. History  Substance Use Topics  . Smoking status: Current Every Day Smoker -- 0.50 packs/day for 21 years    Types: Cigarettes  . Smokeless tobacco: Current User  . Alcohol Use: No   OB History    No data available     Review of Systems   See History of Present Illness; otherwise all other systems are reviewed and negative  Allergies  Codeine; Darvocet; Enalapril; Morphine and related; Sulfamethoxazole-trimethoprim; and  Vicodin  Home Medications   Prior to Admission medications   Medication Sig Start Date End Date Taking? Authorizing Provider  aspirin 81 MG EC tablet Take 81 mg by mouth daily.      Historical Provider, MD  atorvastatin (LIPITOR) 40 MG tablet Take 1 tablet (40 mg total) by mouth daily. 08/31/14   Gaylord Shih, MD  cephALEXin (KEFLEX) 500 MG capsule Take 1 capsule (500 mg total) by mouth 4 (four) times daily. Patient not taking: Reported on 10/05/2014 09/25/14   Doug Sou, MD  furosemide (LASIX) 40 MG tablet Take 1 tablet (40 mg total) by mouth daily. 10/03/14   Joanna Puff, MD  glimepiride (AMARYL) 4 MG tablet Take 1 tablet (4 mg total) by mouth daily with breakfast. 02/22/14   Lonia Skinner, MD  losartan (COZAAR) 50 MG tablet Take 1 tablet (50 mg total) by mouth daily. 10/05/14   Gaylord Shih, MD  metFORMIN (GLUCOPHAGE) 1000 MG tablet Take 1 tablet (1,000 mg total) by mouth 2 (two) times daily with a meal. 07/30/14   Joanna Puff, MD  metoprolol succinate (TOPROL-XL) 25 MG 24 hr tablet Take 0.5 tablets (12.5 mg total) by mouth daily. 10/03/14   Joanna Puff, MD  ranitidine (ZANTAC) 150 MG capsule Take 1 capsule (150 mg total) by mouth 2 (two) times daily. 11/23/13   Lonia Skinner, MD  BP 137/59 mmHg  Pulse 81  Temp(Src) 97.6 F (36.4 C) (Oral)  Resp 19  Ht  (1.575 m)  Wt 162 lb (73.483 kg)  BMI 29.62 kg/m2  SpO2 95% Physical Exam  Constitutional: She is oriented to person, place, and time. She appears well-developed and well-nourished.  HENT:  Head: Normocephalic and atraumatic.  Nose: Nose normal.  Mouth/Throat: Oropharynx is clear and moist.  Eyes: Conjunctivae and EOM are normal. Pupils are equal, round, and reactive to light.  Neck: Normal range of motion. Neck supple. No JVD present. No tracheal deviation present. No thyromegaly present.  Cardiovascular: Normal rate, regular rhythm, normal heart sounds and intact distal pulses.  Exam reveals no  gallop and no friction rub.   No murmur heard. Pulmonary/Chest: Effort normal. No stridor. No respiratory distress. She has no wheezes. She has rales. She exhibits no tenderness.  Abdominal: Soft. Bowel sounds are normal. She exhibits no distension and no mass. There is no tenderness. There is no rebound and no guarding.  Musculoskeletal: Normal range of motion. She exhibits no edema or tenderness.  Lymphadenopathy:    She has no cervical adenopathy.  Neurological: She is alert and oriented to person, place, and time. She displays normal reflexes. She exhibits normal muscle tone. Coordination normal.  Skin: Skin is warm and dry. No rash noted. No erythema. No pallor.  Psychiatric: She has a normal mood and affect. Her behavior is normal. Judgment and thought content normal.  Nursing note and vitals reviewed.   ED Course  Procedures (including critical care time)   COORDINATION OF CARE: 4:15 AM Discussed treatment plan with patient at beside, the patient agrees with the plan and has no further questions at this time.   Labs Review Labs Reviewed  CBC - Abnormal; Notable for the following:    Hemoglobin 11.8 (*)    HCT 35.7 (*)    All other components within normal limits  BASIC METABOLIC PANEL - Abnormal; Notable for the following:    Potassium 3.4 (*)    Glucose, Bld 197 (*)    All other components within normal limits  BRAIN NATRIURETIC PEPTIDE - Abnormal; Notable for the following:    B Natriuretic Peptide 424.8 (*)    All other components within normal limits  TROPONIN I    Imaging Review Dg Chest 2 View  10/21/2014   CLINICAL DATA:  Headache, chest pain and weakness for 2 hr.  EXAM: CHEST  2 VIEW  COMPARISON:  Chest radiograph August 29, 2014  FINDINGS: Diffuse mild interstitial prominence, increased from prior examination. No pleural effusions or focal consolidations. Strandy densities RIGHT lung base. Cardiac silhouette is mildly enlarged, mediastinal silhouette is  nonsuspicious. Soft tissue planes and include osseous structures are non acute ; probable loose body about the LEFT shoulder. Mild degenerative change of the thoracic spine.  IMPRESSION: Increasing mild interstitial prominence which may reflect pulmonary edema or possibly atypical infection. RIGHT lung base atelectasis.   Electronically Signed   By: Awilda Metro   On: 10/21/2014 04:54     EKG Interpretation   Date/Time:  Friday October 21 2014 05:56:17 EST Ventricular Rate:  74 PR Interval:  160 QRS Duration: 101 QT Interval:  476 QTC Calculation: 528 R Axis:   -34 Text Interpretation:  Sinus rhythm Left ventricular hypertrophy Prolonged  QT interval No significant change since last tracing Confirmed by Britney Captain   MD, Evaan Tidwell ([id number]) on 10/21/2014 6:01:59 AM      MDM   Final diagnoses:  CHF exacerbation    50 year old female presents to the emergency department with dizziness, shortness of breath, cough.  Rales on exam, mild abdominal distension.  Suspect CHF exacerbation.   I personally performed the services described in this documentation, which was scribed in my presence. The recorded information has been reviewed and is accurate.     Grace Mackie, MD 10/21/14 [phone]

## 2014-10-21 NOTE — ED Notes (Signed)
Patient transported to X-ray 

## 2014-10-21 NOTE — H&P (Signed)
Family Medicine Teaching [facility] Admission History and Physical Service Pager: [phone]  Patient name: Grace Ferrell Medical record number: [id number] Date of birth: 07/06/1966 Age: 49 y.o. Gender: female  Primary Care Provider: Joanna Puff, MD Consultants: Cardiology Code Status: full code (confirmed on admission)  Chief Complaint: dyspnea, HA, cough  Assessment and Plan: [name] is a 49 y.o. female presenting with dyspnea, cough, found to be hypoxic, suspected acute on CHF exacerbation. Significant recent history hospitalized 08/2014 for same complaint. PMH is significant for chronic systolic CHF (new dx in 08/2014, ECHO 35-40%) , HTN, T2DM, HLD, tobacco abuse.  # Dyspnea, suspected Acute CHF Exacerbation, in setting of Chronic Systolic CHF (reduced EF) On admission, presented with acute dyspnea within 24 hours, +orthopnea, cough, similar to presentation 08/2014 for hospitalized for same (CHF dx'd at that time), last ECHO 08/2014 (EF 35-40%, diffuse hypokinesis). Most likely acute CHF (+bibasilar crackles, hypoxia to 82% >> new O2 req 2L, CXR with mild pulm edema, ProBNP mild elevated 400s, unclear dry wt but near prior discharge wt) vs possible less likely PNA (?atypical PNA on CXR, afebrile, nml WBC) vs less likely COPD exac (no h/o COPD, but active tobacco abuse, non-productive cough). - Admit to floor, under FPTS, attending McDiarmid - Telemetry monitoring - s/p Lasix  IV given in ED. Will continue Lasix  IV x 1 more dose @ 1800. Will reassess volume status daily - Daily weights, strict Is&Os - c/s Cardiology (established with Cards outpt - Dr. Daleen Squibb) - greatly appreciate assistance - Trend troponins q 6 x 3 (neg x 1), repeat EKG, TSH - Supplemental O2 prn to maintain O2 sats >92%  #HTN: Stable. Currently normotensive. - Continue home Metoprolol and Losartan  #HLD: Last lipid panel 10/05/14 with LDL 103.   - Continue home atorvastatin  and ASA   daily.  #T2DM: Well controlled. Last A1c (08/28/17) 6.0. - Holding home glimepiride and Metformin - Moderate SSI - Monitor CBGs  FEN/GI: Heart healthy/carb modified diet, SLIV Prophylaxis: SQ Heparin  Disposition: Admit to FPTS for aggressive diuresis for CHF exacerbation, consult Cardiology  History of Present Illness: Grace Ferrell is a 49 y.o. female presenting with dyspnea, cough.  Similar to SOB from last presentation for acute CHF about 1 month ago.  Reports productive cough, SOB starting around 1600 yesterday.  Reports compliance with medications, including Lasix  daily, without any recent missed doses. Does not know dry weight.  Does not think that she has gained weight recently, though. Denies CP, vomiting, abd pain, weakness, edema. +Nausea. No sick contacts, fevers. Not on O2 at home. Stable 2 pillow orthopnea, no PND.  Review Of Systems: Per HPI with the following additions: none Otherwise 12 point review of systems was performed and was unremarkable.  Patient Active Problem List   Diagnosis Date Noted  . CHF exacerbation 10/21/2014  . Cough   . Dyspnea   . Pleuritic chest pain   . Essential hypertension   . Chronic systolic heart failure 08/28/2014  . Acute sinusitis, unspecified 11/24/2013  . Dysuria 06/17/2013  . Menopause 04/11/2012  . Chronic sinusitis 04/11/2012  . Mixed hyperlipidemia due to type 2 diabetes mellitus 03/12/2012  . Chronic pain syndrome 05/21/2011  . GOITER, UNSPECIFIED 01/08/2010  . TOBACCO USER 07/29/2009  . DIABETES MELLITUS II, UNCOMPLICATED 12/18/2006  . DEPRESSIVE DISORDER, NOS 12/18/2006   Past Medical History: Past Medical History  Diagnosis Date  . Hypertension   . Diabetes mellitus   . Hypercholesteremia  Past Surgical History: Past Surgical History  Procedure Laterality Date  . Knee surgery    . Tubal ligation    . Endometrial ablation     Social History: History  Substance Use Topics  . Smoking status: Current  Every Day Smoker -- 0.50 packs/day for 21 years    Types: Cigarettes  . Smokeless tobacco: Current User  . Alcohol Use: No   Additional social history: smokes 0.5 PPD x30 years, no alcohol, drugs  Please also refer to relevant sections of EMR.  Family History: History reviewed. No pertinent family history. Allergies and Medications: Allergies  Allergen Reactions  . Codeine     itching  . Darvocet [Propoxyphene N-Acetaminophen] Nausea And Vomiting  . Enalapril Cough    Dry cough  . Morphine And Related     itching  . Sulfamethoxazole-Trimethoprim     REACTION: developed itching 03-28-09 after one dose  . Vicodin [Hydrocodone-Acetaminophen] Itching   No current facility-administered medications on file prior to encounter.   Current Outpatient Prescriptions on File Prior to Encounter  Medication Sig Dispense Refill  . aspirin 81 MG EC tablet Take 81 mg by mouth daily.      Marland Kitchen atorvastatin (LIPITOR) 40 MG tablet Take 1 tablet (40 mg total) by mouth daily. 90 tablet 3  . furosemide (LASIX) 40 MG tablet Take 1 tablet (40 mg total) by mouth daily. 30 tablet 2  . glimepiride (AMARYL) 4 MG tablet Take 1 tablet (4 mg total) by mouth daily with breakfast. 30 tablet 6  . losartan (COZAAR) 50 MG tablet Take 1 tablet (50 mg total) by mouth daily. 90 tablet 3  . metFORMIN (GLUCOPHAGE) 1000 MG tablet Take 1 tablet (1,000 mg total) by mouth 2 (two) times daily with a meal. 60 tablet 1  . metoprolol succinate (TOPROL-XL) 25 MG 24 hr tablet Take 0.5 tablets (12.5 mg total) by mouth daily. 15 tablet 2  . ranitidine (ZANTAC) 150 MG capsule Take 1 capsule (150 mg total) by mouth 2 (two) times daily. 60 capsule 2  . cephALEXin (KEFLEX) 500 MG capsule Take 1 capsule (500 mg total) by mouth 4 (four) times daily. (Patient not taking: Reported on 10/05/2014) 28 capsule 0    Objective: BP 149/69 mmHg  Pulse 73  Temp(Src) 97.6 F (36.4 C) (Oral)  Resp 30  Ht  (1.575 m)  Wt 162 lb (73.483 kg)  BMI  29.62 kg/m2  SpO2 97% Exam: General: well-appearing, sleepy and resting in bed, cooperative, NAD HEENT: NCAT, PERRL, EOMI, patent nares w/o congestion, oropharynx clear, MMM Neck: Supple, no JVD Cardiovascular: RRR, no murmurs heard Respiratory: Moderate mid to bibasilar crackles, no wheezing. Good air movement. On [place] O2. Speaks full sentences with normal WOB. Abdomen: soft, NTND, no masses, +active BS Ext - non-tender, minimal trace edema b/l LE, peripheral pulses intact +2 b/l dp Skin - warm, dry Neuro - awake, alert, oriented, grossly non-focal  Labs and Imaging: CBC BMET   Recent Labs Lab 10/21/14 0333  WBC 9.6  HGB 11.8*  HCT 35.7*  PLT 256    Recent Labs Lab 10/21/14 0333  NA 140  K 3.4*  CL 105  CO2 24  BUN 10  CREATININE 0.79  GLUCOSE 197*  CALCIUM 9.1     iStat Troponin 0.03  Troponin-I (neg)  BNP: 424.8  CXR (10/21/14): Increasing mild interstitial prominence which may reflect pulmonary edema or possibly atypical infection. RIGHT lung base atelectasis.  Shirlee Latch, MD 10/21/2014, 5:57 AM PGY-1, [place]  Family Medicine FPTS Intern pager: [phone], text pages welcome  Upper Level Addendum:  I have seen and evaluated this patient along with Dr. Beryle Flock and reviewed the above note, making necessary revisions in purple.

## 2014-10-21 NOTE — ED Notes (Signed)
Pt O2 saturation on 85% on 4L [place], pt place on a NRM O2 Sat 97%.

## 2014-10-21 NOTE — Consult Note (Signed)
Admit date: 10/21/2014 Referring Physician  Dr. McDiarmid Primary Physician  Dr. Rodrigo Ran Primary Cardiologist  Dr. Valera Castle Reason for Consultation  CHF  HPI: Grace Ferrell is a 49 y.o. female presenting with dyspnea, cough, found to be hypoxic, suspected acute on CHF exacerbation. Significant recent history hospitalized 08/2014 for same complaint. PMH is significant for chronic systolic CHF (new dx in 08/2014, ECHO 35-40%) , HTN, T2DM, HLD, tobacco abuse.  During that hospitalization she left AMA prior to cardiology evaluating her.  Her troponin was negative at that time.  She was started on Lasix as an outpt and then referred to Dr. Daleen Squibb for Cardiac followup. He last saw her 10/05/2014 and changed her ACE I to ARB due to cough.  An echo was ordered for 3 months to reassess LVF on medical therapy.  She was supposed to be set up for a stress myoview but I cannot see that it has been done. She now presents with acute SOB over the past 24 hours with increased cough productive of yellow sputum but no fever or chills.  On admission she was hypoxic, BNP 424 and chest xray showed pulmonary edema.  She was started on Lasix.  Cardiology is now asked to consult.  She is feeling better and able to lay flat this am on lasix.  She denies any chest pain, LE edema or increased abdominal girth.  She says that she has been very compliant with her low sodium diet and has been weighing herself and her weight has remained within 2-3lbs of her weight on discharge.     PMH:   Past Medical History  Diagnosis Date  . Hypertension   . Diabetes mellitus   . Hypercholesteremia      PSH:   Past Surgical History  Procedure Laterality Date  . Knee surgery    . Tubal ligation    . Endometrial ablation      Allergies:  Codeine; Darvocet; Enalapril; Morphine and related; Sulfamethoxazole-trimethoprim; and Vicodin Prior to Admit Meds:   Prescriptions prior to admission  Medication Sig Dispense Refill Last Dose   . aspirin 81 MG EC tablet Take 81 mg by mouth daily.     10/20/2014 at Unknown time  . atorvastatin (LIPITOR) 40 MG tablet Take 1 tablet (40 mg total) by mouth daily. 90 tablet 3 10/20/2014 at Unknown time  . furosemide (LASIX) 40 MG tablet Take 1 tablet (40 mg total) by mouth daily. 30 tablet 2 10/20/2014 at Unknown time  . glimepiride (AMARYL) 4 MG tablet Take 1 tablet (4 mg total) by mouth daily with breakfast. 30 tablet 6 10/20/2014 at Unknown time  . losartan (COZAAR) 50 MG tablet Take 1 tablet (50 mg total) by mouth daily. 90 tablet 3 10/20/2014 at Unknown time  . metFORMIN (GLUCOPHAGE) 1000 MG tablet Take 1 tablet (1,000 mg total) by mouth 2 (two) times daily with a meal. 60 tablet 1 10/20/2014 at Unknown time  . metoprolol succinate (TOPROL-XL) 25 MG 24 hr tablet Take 0.5 tablets (12.5 mg total) by mouth daily. 15 tablet 2 10/20/2014 at 0800  . ranitidine (ZANTAC) 150 MG capsule Take 1 capsule (150 mg total) by mouth 2 (two) times daily. 60 capsule 2 10/20/2014 at Unknown time  . cephALEXin (KEFLEX) 500 MG capsule Take 1 capsule (500 mg total) by mouth 4 (four) times daily. (Patient not taking: Reported on 10/05/2014) 28 capsule 0 Not Taking at Unknown time   Fam HX:   History reviewed. No pertinent  family history. Social HX:    History   Social History  . Marital Status: Single    Spouse Name: N/A    Number of Children: N/A  . Years of Education: N/A   Occupational History  . Not on file.   Social History Main Topics  . Smoking status: Current Every Day Smoker -- 0.50 packs/day for 21 years    Types: Cigarettes  . Smokeless tobacco: Current User  . Alcohol Use: No  . Drug Use: No  . Sexual Activity: Not on file   Other Topics Concern  . Not on file   Social History Narrative     ROS:  All 11 ROS were addressed and are negative except what is stated in the HPI  Physical Exam: Blood pressure 140/64, pulse 74, temperature 97.5 F (36.4 C), temperature source Oral,  resp. rate 20, height  (1.575 m), weight 165 lb 3.2 oz (74.934 kg), SpO2 95 %.    General: Well developed, well nourished, in no acute distress Head: Eyes PERRLA, No xanthomas.   Normal cephalic and atramatic  Lungs: crackles at bases Heart:   HRRR S1 S2 Pulses are 2+ & equal.            No carotid bruit. No JVD.  No abdominal bruits. No femoral bruits. Abdomen: Bowel sounds are positive, abdomen soft and non-tender without masses  Extremities:   No clubbing, cyanosis or edema.  DP +1 Neuro: Alert and oriented X 3. Psych:  Good affect, responds appropriately    Labs:   Lab Results  Component Value Date   WBC 9.6 10/21/2014   HGB 11.8* 10/21/2014   HCT 35.7* 10/21/2014   MCV 80.0 10/21/2014   PLT 256 10/21/2014    Recent Labs Lab 10/21/14 0333  NA 140  K 3.4*  CL 105  CO2 24  BUN 10  CREATININE 0.79  CALCIUM 9.1  GLUCOSE 197*   No results found for: PTT Lab Results  Component Value Date   INR 1.0 09/24/2007   Lab Results  Component Value Date   TROPONINI <0.03 10/21/2014     Lab Results  Component Value Date   CHOL 173 10/05/2014   CHOL 258* 12/01/2013   CHOL 222* 01/25/2008   Lab Results  Component Value Date   HDL 40 10/05/2014   HDL 37* 12/01/2013   HDL 45 01/25/2008   Lab Results  Component Value Date   LDLCALC 103* 10/05/2014   LDLCALC 164* 12/01/2013   LDLCALC 146* 01/25/2008   Lab Results  Component Value Date   TRIG 151* 10/05/2014   TRIG 287* 12/01/2013   TRIG 157* 01/25/2008   Lab Results  Component Value Date   CHOLHDL 4.3 10/05/2014   CHOLHDL 7.0 12/01/2013   CHOLHDL 4.9 Ratio 01/25/2008   Lab Results  Component Value Date   LDLDIRECT 174* 03/10/2012   LDLDIRECT 132* 08/22/2009   LDLDIRECT 115* 10/03/2008      Radiology:  Dg Chest 2 View  10/21/2014   CLINICAL DATA:  Headache, chest pain and weakness for 2 hr.  EXAM: CHEST  2 VIEW  COMPARISON:  Chest radiograph August 29, 2014  FINDINGS: Diffuse mild interstitial  prominence, increased from prior examination. No pleural effusions or focal consolidations. Strandy densities RIGHT lung base. Cardiac silhouette is mildly enlarged, mediastinal silhouette is nonsuspicious. Soft tissue planes and include osseous structures are non acute ; probable loose body about the LEFT shoulder. Mild degenerative change of the thoracic spine.  IMPRESSION:  Increasing mild interstitial prominence which may reflect pulmonary edema or possibly atypical infection. RIGHT lung base atelectasis.   Electronically Signed   By: Awilda Metro   On: 10/21/2014 04:54    EKG:  NSR with LVH and nonspecific ST abnormality and prolonged QT  ASSESSMENT:  1.  Acute on chronic combined systolic/diastolic CHF - her weight has not changed much since discharge and she says that she has been compliant with her diet. Unsure what tipped her over.  2.  Moderate LV dysfunction EF 35-40% by last echo 3.  HTN - controlled 4.  Type 2 DM  PLAN:   1.  Continue IV diuresis as you are doing and change to Lasix  IV BID starting tomorrow 2.  Repeat 2D echo to see if LVF has worsened 3.  Recommend right and left heart cath once CHF has improved.  Her last echo showed regional wall motion abnormalities and she also has moderate to severe MR and moderate AR with moderate pulmonary HTN 4.  Continue ASA/statin 5.  Continue ARB.  I will change lopressor to Coreg 6.25mg  BID  Quintella Reichert, MD  10/21/2014  10:46 AM

## 2014-10-21 NOTE — ED Notes (Signed)
Family practice at the bedside.

## 2014-10-21 NOTE — ED Notes (Signed)
Pt brought to the ED by [facility] for c/o headache, dizziness, SOB and nausea, pt denies any CP, fever or chills, pt is having a persistent dry cough and is 85% on RA. Pt got 81 mg x 4 ASA at home prior to EMS arrival and got 4 mg IV of Zofran by EMS.

## 2014-10-22 DIAGNOSIS — Z720 Tobacco use: Secondary | ICD-10-CM

## 2014-10-22 DIAGNOSIS — I429 Cardiomyopathy, unspecified: Secondary | ICD-10-CM

## 2014-10-22 DIAGNOSIS — E785 Hyperlipidemia, unspecified: Secondary | ICD-10-CM

## 2014-10-22 LAB — BASIC METABOLIC PANEL
Anion gap: 12 (ref 5–15)
BUN: 12 mg/dL (ref 6–23)
CALCIUM: 9 mg/dL (ref 8.4–10.5)
CHLORIDE: 103 meq/L (ref 96–112)
CO2: 25 mmol/L (ref 19–32)
Creatinine, Ser: 0.86 mg/dL (ref 0.50–1.10)
GFR calc Af Amer: 89 mL/min — ABNORMAL LOW (ref >90)
GFR calc non Af Amer: 77 mL/min — ABNORMAL LOW (ref >90)
Glucose, Bld: 199 mg/dL — ABNORMAL HIGH (ref 70–99)
Potassium: 3.6 mmol/L (ref 3.5–5.1)
Sodium: 140 mmol/L (ref 135–145)

## 2014-10-22 LAB — CBC
HEMATOCRIT: 34.1 % — AB (ref 36.0–46.0)
Hemoglobin: 11.3 g/dL — ABNORMAL LOW (ref 12.0–15.0)
MCH: 26.5 pg (ref 26.0–34.0)
MCHC: 33.1 g/dL (ref 30.0–36.0)
MCV: 79.9 fL (ref 78.0–100.0)
Platelets: 254 10*3/uL (ref 150–400)
RBC: 4.27 MIL/uL (ref 3.87–5.11)
RDW: 15.4 % (ref 11.5–15.5)
WBC: 7.5 10*3/uL (ref 4.0–10.5)

## 2014-10-22 LAB — GLUCOSE, CAPILLARY
GLUCOSE-CAPILLARY: 192 mg/dL — AB (ref 70–99)
GLUCOSE-CAPILLARY: 211 mg/dL — AB (ref 70–99)
Glucose-Capillary: 272 mg/dL — ABNORMAL HIGH (ref 70–99)
Glucose-Capillary: 199 mg/dL — ABNORMAL HIGH (ref 70–99)

## 2014-10-22 LAB — TSH: TSH: 1.186 u[IU]/mL (ref 0.350–4.500)

## 2014-10-22 MED ORDER — INSULIN ASPART 100 UNIT/ML ~~LOC~~ SOLN
0.0000 [IU] | Freq: Three times a day (TID) | SUBCUTANEOUS | Status: DC
  Administered 2014-10-22: 11 [IU] via SUBCUTANEOUS
  Administered 2014-10-22: 4 [IU] via SUBCUTANEOUS
  Administered 2014-10-23: 7 [IU] via SUBCUTANEOUS

## 2014-10-22 MED ORDER — CARVEDILOL 6.25 MG PO TABS
6.2500 mg | ORAL_TABLET | Freq: Two times a day (BID) | ORAL | Status: DC
  Administered 2014-10-22 – 2014-10-23 (×3): 6.25 mg via ORAL
  Filled 2014-10-22 (×5): qty 1

## 2014-10-22 MED ORDER — FUROSEMIDE 40 MG PO TABS
40.0000 mg | ORAL_TABLET | Freq: Every day | ORAL | Status: DC
  Administered 2014-10-23: 40 mg via ORAL
  Filled 2014-10-22: qty 1

## 2014-10-22 NOTE — Progress Notes (Signed)
Family Medicine Teaching Service Daily Progress Note Intern Pager: [phone]  Patient name: Grace Ferrell Medical record number: [id number] Date of birth: 07-Mar-1966 Age: 49 y.o. Gender: female  Primary Care Provider: Joanna Puff, MD Consultants: Cardiology Code Status: full code (confirmed on admission)  Pt Overview and Major Events to Date:  1/1 - admit with dyspnea, acute CHF exacerbation  Assessment and Plan: Grace Ferrell is a 49 y.o. female presenting with dyspnea, cough, found to be hypoxic, suspected acute on CHF exacerbation. Significant recent history hospitalized 08/2014 for same complaint. PMH is significant for chronic systolic CHF (new dx in 08/2014, ECHO 35-40%) , HTN, T2DM, HLD, tobacco abuse.  # Dyspnea, suspected Acute CHF Exacerbation, in setting of Chronic Systolic CHF (reduced EF) On admission, presented with acute dyspnea within 24 hours, +orthopnea, cough, similar to presentation 08/2014 for hospitalized for same (CHF dx'd at that time), last ECHO 08/2014 (EF 35-40%, diffuse hypokinesis). Most likely acute CHF (+bibasilar crackles, hypoxia to 82% >> new O2 req 2L, CXR with mild pulm edema, ProBNP mild elevated 400s, unclear dry wt but near prior discharge wt) vs possible less likely PNA (?atypical PNA on CXR, afebrile, nml WBC) vs less likely COPD exac (no h/o COPD, but active tobacco abuse, non-productive cough). - s/p Lasix 40mg  IV x 2 with diuresis of 2.25L, weight unchanged - symptomatic improvement per patient this AM - cardiology following, greatly appreciate assistance, await today's recs (timing of cath?) - Telemetry monitoring - Daily weights, strict Is&Os, reassess volume status daily - cards recs: IV lasix 40mg  BID, 2d echo, RHC+LHC once CHF improved - troponins neg x 3. TSH NL. Repeat EKG with some ?new T wave inversions in lateral leads - Supplemental O2 prn to maintain O2 sats >92% - not requiring O2 at present  #HTN: Stable. Currently normotensive. -  Continue home Losartan, beta blocker changed from metoprolol to coreg 6.25mg  BID per cardiology. Pt displeased with this (intellectually doesn't like having diastolics in the 50's and she attributes it to this change. Asymptomatic physically). Recommended she discuss change with cardiology since they recommended the switch.  #HLD: Last lipid panel 10/05/14 with LDL 103.  - Continue home atorvastatin 40mg  and ASA 81mg  daily.  #T2DM: Well controlled. Last A1c (08/28/14) 6.0. - Holding home glimepiride and Metformin -  increase to resistant SSI today as sugars around 200 - Monitor CBGs  FEN/GI: Heart healthy/carb modified diet, SLIV Prophylaxis: SQ Heparin  Disposition: pending improvement in dyspnea, anticipate eventual d/c to home  Subjective:  Pt reports feeling better. Breathing much improved. Displeased with switch in beta blocker.   Objective: Temp:  [97.4 F (36.3 C)-98.4 F (36.9 C)] 97.4 F (36.3 C) (01/02 0402) Pulse Rate:  [67-84] 67 (01/02 0402) Resp:  [18] 18 (01/02 0402) BP: (116-133)/(47-57) 133/55 mmHg (01/02 0402) SpO2:  [91 %-94 %] 94 % (01/02 0402) Weight:  [165 lb 1.6 oz (74.889 kg)] 165 lb 1.6 oz (74.889 kg) (01/02 0402) Physical Exam: General: NAD, pleasant, cooperative, lying in bed, sits up with ease Cardiovascular: RRR no murmurs Respiratory: CTAB, NWOB Abdomen: soft, NTTP Extremities: No appreciable lower extremity edema bilaterally   Laboratory:  Recent Labs Lab 10/21/14 0333 10/22/14 0315  WBC 9.6 7.5  HGB 11.8* 11.3*  HCT 35.7* 34.1*  PLT 256 254    Recent Labs Lab 10/21/14 0333 10/22/14 0315  NA 140 140  K 3.4* 3.6  CL 105 103  CO2 24 25  BUN 10 12  CREATININE 0.79 0.86  CALCIUM  9.1 9.0  GLUCOSE 197* 199*    Imaging/Diagnostic Tests: CXR (10/21/14): Increasing mild interstitial prominence which may reflect pulmonary edema or possibly atypical infection. RIGHT lung base atelectasis.  Latrelle Dodrill, MD 10/22/2014, 8:40  AM PGY-3, [facility] FPTS Intern pager: [phone], text pages welcome

## 2014-10-22 NOTE — Progress Notes (Signed)
SUBJECTIVE: Pt denies chest pain. No longer has shortness of breath. Cough has also improved. No leg swelling. Awaiting echocardiogram.     Intake/Output Summary (Last 24 hours) at 10/22/14 1049 Last data filed at 10/22/14 1022  Gross per 24 hour  Intake    843 ml  Output   2800 ml  Net  -1957 ml    Current Facility-Administered Medications  Medication Dose Route Frequency Provider Last Rate Last Dose  . aspirin EC tablet 81 mg  81 mg Oral Daily Saralyn Pilar, DO   81 mg at 10/22/14 1019  . atorvastatin (LIPITOR) tablet 40 mg  40 mg Oral Daily Saralyn Pilar, DO   40 mg at 10/22/14 1019  . carvedilol (COREG) tablet 6.25 mg  6.25 mg Oral BID WC Leighton Roach McDiarmid, MD   6.25 mg at 10/22/14 0903  . famotidine (PEPCID) tablet 20 mg  20 mg Oral BID Saralyn Pilar, DO   20 mg at 10/21/14 2209  . [START ON 10/23/2014] furosemide (LASIX) tablet 40 mg  40 mg Oral Daily Laqueta Linden, MD      . heparin injection 5,000 Units  5,000 Units Subcutaneous 3 times per day Saralyn Pilar, DO   5,000 Units at 10/22/14 0609  . insulin aspart (novoLOG) injection 0-9 Units  0-9 Units Subcutaneous TID WC Saralyn Pilar, DO   2 Units at 10/22/14 [phone]  . losartan (COZAAR) tablet 50 mg  50 mg Oral Daily Saralyn Pilar, DO   50 mg at 10/22/14 1019  . ondansetron (ZOFRAN) tablet 4 mg  4 mg Oral Q6H PRN Saralyn Pilar, DO       Or  . ondansetron [facility]) injection 4 mg  4 mg Intravenous Q6H PRN Saralyn Pilar, DO      . sodium chloride 0.9 % injection 3 mL  3 mL Intravenous Q12H Saralyn Pilar, DO   3 mL at 10/22/14 1019    Filed Vitals:   10/21/14 2034 10/22/14 0402 10/22/14 0842 10/22/14 1016  BP: 116/47 133/55 152/74 123/56  Pulse: 84 67  66  Temp: 98.4 F (36.9 C) 97.4 F (36.3 C)  97.8 F (36.6 C)  TempSrc: Oral Oral  Oral  Resp: Height:      Weight:  165 lb 1.6 oz (74.889 kg)    SpO2: 92% 94%  99%     PHYSICAL EXAM General: NAD HEENT: Normal. Neck: No JVD, no thyromegaly.  Lungs: Clear to auscultation bilaterally with normal respiratory effort. CV: Nondisplaced PMI.  Regular rate and rhythm, normal S1/S2, no S3/S4, no murmur.  No pretibial edema.  No carotid bruit.  Normal pedal pulses.  Abdomen: Soft, nontender, no hepatosplenomegaly, no distention.  Neurologic: Alert and oriented x 3.  Psych: Normal affect. Musculoskeletal: Normal range of motion. No gross deformities. Extremities: No clubbing or cyanosis.   TELEMETRY: Reviewed telemetry pt in sinus rhythm.  LABS: Basic Metabolic Panel:  Recent Labs  96/04/54 0333 10/22/14 0315  NA 140 140  K 3.4* 3.6  CL 105 103  CO2 24 25  GLUCOSE 197* 199*  BUN 10 12  CREATININE 0.79 0.86  CALCIUM 9.1 9.0   Liver Function Tests: No results for input(s): AST, ALT, ALKPHOS, BILITOT, PROT, ALBUMIN in the last 72 hours. No results for input(s): LIPASE, AMYLASE in the last 72 hours. CBC:  Recent Labs  10/21/14 0333 10/22/14 0315  WBC 9.6 7.5  HGB 11.8* 11.3*  HCT 35.7* 34.1*  MCV  80.0 79.9  PLT 256 254   Cardiac Enzymes:  Recent Labs  10/21/14 0333 10/21/14 0859 10/21/14 1455  TROPONINI <0.03 <0.03 <0.03   BNP: Invalid input(s): POCBNP D-Dimer: No results for input(s): DDIMER in the last 72 hours. Hemoglobin A1C: No results for input(s): HGBA1C in the last 72 hours. Fasting Lipid Panel: No results for input(s): CHOL, HDL, LDLCALC, TRIG, CHOLHDL, LDLDIRECT in the last 72 hours. Thyroid Function Tests:  Recent Labs  10/22/14 0315  TSH 1.186   Anemia Panel: No results for input(s): VITAMINB12, FOLATE, FERRITIN, TIBC, IRON, RETICCTPCT in the last 72 hours.  RADIOLOGY: Dg Chest 2 View  10/21/2014   CLINICAL DATA:  Headache, chest pain and weakness for 2 hr.  EXAM: CHEST  2 VIEW  COMPARISON:  Chest radiograph August 29, 2014  FINDINGS: Diffuse mild interstitial prominence, increased from prior examination.  No pleural effusions or focal consolidations. Strandy densities RIGHT lung base. Cardiac silhouette is mildly enlarged, mediastinal silhouette is nonsuspicious. Soft tissue planes and include osseous structures are non acute ; probable loose body about the LEFT shoulder. Mild degenerative change of the thoracic spine.  IMPRESSION: Increasing mild interstitial prominence which may reflect pulmonary edema or possibly atypical infection. RIGHT lung base atelectasis.   Electronically Signed   By: Awilda Metro   On: 10/21/2014 04:54      ASSESSMENT AND PLAN: 1. Acute on chronic combined systolic/diastolic CHF: She appears to be euvolemic today. I will d/c IV Lasix and start Lasix 40 mg daily. Now on Coreg along with Losartan. Repeat echocardiogram pending. Echo in 08/2014 mentioned moderate aortic and moderate to severe mitral regurgitation, the latter of which may be the etiology of current decompensation. If severe MR is noted on today's echo with no improvement in LV function, would recommend outpatient right and left heart catheterization with coronary angiography to also determine if there is an ischemic etiology. 2. Essential HTN: Stable on present therapy which includes Coreg and losartan. No changes. 3. Type 2 diabetes: Stable. Continue present therapy.  4. Hyperlipidemia: On statin therapy. No changes. 5. Tobacco abuse disorder: Cessation counseling previously provided. 6. Valvular heart disease: See #1. Reassess mitral regurgitation severity with echocardiogram today.  Prentice Docker, M.D., F.A.C.C.

## 2014-10-23 DIAGNOSIS — E1169 Type 2 diabetes mellitus with other specified complication: Secondary | ICD-10-CM

## 2014-10-23 DIAGNOSIS — E782 Mixed hyperlipidemia: Secondary | ICD-10-CM

## 2014-10-23 LAB — GLUCOSE, CAPILLARY
GLUCOSE-CAPILLARY: 226 mg/dL — AB (ref 70–99)
Glucose-Capillary: 347 mg/dL — ABNORMAL HIGH (ref 70–99)

## 2014-10-23 LAB — BASIC METABOLIC PANEL
Anion gap: 6 (ref 5–15)
BUN: 12 mg/dL (ref 6–23)
CALCIUM: 9.3 mg/dL (ref 8.4–10.5)
CHLORIDE: 103 meq/L (ref 96–112)
CO2: 29 mmol/L (ref 19–32)
Creatinine, Ser: 0.78 mg/dL (ref 0.50–1.10)
GLUCOSE: 240 mg/dL — AB (ref 70–99)
Potassium: 3.7 mmol/L (ref 3.5–5.1)
SODIUM: 138 mmol/L (ref 135–145)

## 2014-10-23 MED ORDER — CARVEDILOL 6.25 MG PO TABS: 6.2500 mg | ORAL_TABLET | Freq: Two times a day (BID) | ORAL | Status: DC

## 2014-10-23 NOTE — Progress Notes (Signed)
SUBJECTIVE: Feeling well. Denies chest pain and SOB. Wants to go home. Echo not done.     Intake/Output Summary (Last 24 hours) at 10/23/14 0836 Last data filed at 10/23/14 0400  Gross per 24 hour  Intake    483 ml  Output   3700 ml  Net  -3217 ml    Current Facility-Administered Medications  Medication Dose Route Frequency Provider Last Rate Last Dose  . aspirin EC tablet 81 mg  81 mg Oral Daily Saralyn Pilar, DO   81 mg at 10/22/14 1019  . atorvastatin (LIPITOR) tablet 40 mg  40 mg Oral Daily Saralyn Pilar, DO   40 mg at 10/22/14 1019  . carvedilol (COREG) tablet 6.25 mg  6.25 mg Oral BID WC Leighton Roach McDiarmid, MD   6.25 mg at 10/22/14 1714  . famotidine (PEPCID) tablet 20 mg  20 mg Oral BID Saralyn Pilar, DO   20 mg at 10/21/14 2209  . furosemide (LASIX) tablet 40 mg  40 mg Oral Daily Laqueta Linden, MD      . heparin injection 5,000 Units  5,000 Units Subcutaneous 3 times per day Saralyn Pilar, DO   5,000 Units at 10/22/14 2205  . insulin aspart (novoLOG) injection 0-20 Units  0-20 Units Subcutaneous TID WC Latrelle Dodrill, MD   7 Units at 10/23/14 [phone]  . losartan (COZAAR) tablet 50 mg  50 mg Oral Daily Saralyn Pilar, DO   50 mg at 10/22/14 1019  . ondansetron (ZOFRAN) tablet 4 mg  4 mg Oral Q6H PRN Saralyn Pilar, DO       Or  . ondansetron [facility]) injection 4 mg  4 mg Intravenous Q6H PRN Saralyn Pilar, DO      . sodium chloride 0.9 % injection 3 mL  3 mL Intravenous Q12H Saralyn Pilar, DO   3 mL at 10/22/14 2206    Filed Vitals:   10/22/14 1346 10/22/14 2000 10/23/14 0008 10/23/14 0400  BP: 126/51 127/64 131/58 146/57  Pulse: 71 76 63 60  Temp: 97.4 F (36.3 C) 98.3 F (36.8 C) 98.2 F (36.8 C) 97.7 F (36.5 C)  TempSrc: Oral     Resp: Height:      Weight:    164 lb (74.39 kg)  SpO2: 99% 100% 98% 98%    PHYSICAL EXAM General: NAD HEENT: Normal. Neck: No JVD, no  thyromegaly.  Lungs: Clear to auscultation bilaterally with normal respiratory effort. CV: Nondisplaced PMI.  Regular rate and rhythm, normal S1/S2, no S3/S4, no murmur.  No pretibial edema.  No carotid bruit.  Normal pedal pulses.  Abdomen: Soft, nontender, no hepatosplenomegaly, no distention.  Neurologic: Alert and oriented x 3.  Psych: Normal affect. Musculoskeletal: Normal range of motion. No gross deformities. Extremities: No clubbing or cyanosis.     LABS: Basic Metabolic Panel:  Recent Labs  96/04/54 0315 10/23/14 0350  NA 140 138  K 3.6 3.7  CL 103 103  CO2 25 29  GLUCOSE 199* 240*  BUN 12 12  CREATININE 0.86 0.78  CALCIUM 9.0 9.3   Liver Function Tests: No results for input(s): AST, ALT, ALKPHOS, BILITOT, PROT, ALBUMIN in the last 72 hours. No results for input(s): LIPASE, AMYLASE in the last 72 hours. CBC:  Recent Labs  10/21/14 0333 10/22/14 0315  WBC 9.6 7.5  HGB 11.8* 11.3*  HCT 35.7* 34.1*  MCV 80.0 79.9  PLT 256 254   Cardiac Enzymes:  Recent  Labs  10/21/14 0333 10/21/14 0859 10/21/14 1455  TROPONINI <0.03 <0.03 <0.03   BNP: Invalid input(s): POCBNP D-Dimer: No results for input(s): DDIMER in the last 72 hours. Hemoglobin A1C: No results for input(s): HGBA1C in the last 72 hours. Fasting Lipid Panel: No results for input(s): CHOL, HDL, LDLCALC, TRIG, CHOLHDL, LDLDIRECT in the last 72 hours. Thyroid Function Tests:  Recent Labs  10/22/14 0315  TSH 1.186   Anemia Panel: No results for input(s): VITAMINB12, FOLATE, FERRITIN, TIBC, IRON, RETICCTPCT in the last 72 hours.  RADIOLOGY: Dg Chest 2 View  10/21/2014   CLINICAL DATA:  Headache, chest pain and weakness for 2 hr.  EXAM: CHEST  2 VIEW  COMPARISON:  Chest radiograph August 29, 2014  FINDINGS: Diffuse mild interstitial prominence, increased from prior examination. No pleural effusions or focal consolidations. Strandy densities RIGHT lung base. Cardiac silhouette is mildly  enlarged, mediastinal silhouette is nonsuspicious. Soft tissue planes and include osseous structures are non acute ; probable loose body about the LEFT shoulder. Mild degenerative change of the thoracic spine.  IMPRESSION: Increasing mild interstitial prominence which may reflect pulmonary edema or possibly atypical infection. RIGHT lung base atelectasis.   Electronically Signed   By: Awilda Metro   On: 10/21/2014 04:54      ASSESSMENT AND PLAN: 1. Acute on chronic combined systolic/diastolic CHF: She appears to be euvolemic today. I feel she is stable for discharge on Lasix 40 mg daily. Continue Coreg along with Losartan. Echocardiogram can be performed as outpatient. Echo in 08/2014 mentioned moderate aortic and moderate to severe mitral regurgitation, the latter of which may be the etiology of current decompensation. If severe MR is noted on outpatient echo with no improvement in LV function, would recommend outpatient right and left heart catheterization with coronary angiography to also determine if there is an ischemic etiology. 2. Essential HTN: Mildly elevated today but has been stable on present therapy which includes Coreg and losartan. No changes. 3. Type 2 diabetes: Stable. Continue present therapy.  4. Hyperlipidemia: On statin therapy. No changes. 5. Tobacco abuse disorder: Cessation counseling previously provided. 6. Valvular heart disease: See #1. Reassess mitral regurgitation severity with echocardiogram as outpatient.  Dispo: Stable for discharge.  Prentice Docker, M.D., F.A.C.C.

## 2014-10-23 NOTE — Discharge Summary (Signed)
Family Medicine Teaching [facility] Discharge Summary  Patient name: Grace Ferrell Medical record number: [id number] Date of birth: 1966-06-02 Age: 49 y.o. Gender: female Date of Admission: 10/21/2014  Date of Discharge: 10/23/14 Admitting Physician: Leighton Roach McDiarmid, MD  Primary Care Provider: Joanna Puff, MD Consultants: cardiology  Indication for Hospitalization: Acute CHF exacerbation  Discharge Diagnoses/Problem List:  Acute on chronic CHF Hypertension Hyperlipidemia Type 2 diabetes mellitus  Disposition: Home  Discharge Condition: Stable  Discharge Exam:  General: NAD, pleasant, cooperative, sitting up in bed Cardiovascular: RRR no murmurs Respiratory: CTAB, NWOB Abdomen: soft, NTTP Extremities: No appreciable lower extremity edema bilaterally  Neuro: grossly nonfocal, speech normal  Brief Hospital Course:  Grace Ferrell is a 49 year old female who was admitted to the hospital after presenting with dyspnea, cough, and hypoxia. This was presumed to be an acute on chronic systolic CHF exacerbation.   CHF exacerbation: Her admission exam was significant for bibasilar crackles and hypoxia. Her chest x-ray showed mild pulmonary edema, and proBNP was elevated in the 400s. She was given IV Lasix for diuresis. Troponins were negative 3. Cardiology was consulted and helped manage her diuresis. She diuresed a total of 4.6 L during the course of her hospitalization. Her creatinine remains stable and tolerated the diuresis well. She was transitioned to oral Lasix and was deemed ready for discharge. She will follow up for an outpatient echo, and consideration of an outpatient left and right heart catheterization.  Hypertension: Continued home medications, with the exception that metoprolol was changed to Coreg 6.25 mg twice daily per cardiology  Hyperlipidemia: Continue home aspirin 81 mg daily and atorvastatin 40 mg daily  Type 2 diabetes: held home glimepiride and metformin. Given  sliding scale insulin while hospitalized.  Issues for Follow Up:  -Monitor volume status and renal function -Should follow-up with cardiology for consideration of right and left heart catheterization -Needs outpatient echo since this was not done in the hospital  Significant Procedures: None  Significant Labs and Imaging:   Recent Labs Lab 10/21/14 0333 10/22/14 0315  WBC 9.6 7.5  HGB 11.8* 11.3*  HCT 35.7* 34.1*  PLT 256 254    Recent Labs Lab 10/21/14 0333 10/22/14 0315 10/23/14 0350  NA 140 140 138  K 3.4* 3.6 3.7  CL 105 103 103  CO2 GLUCOSE 197* 199* 240*  BUN CREATININE 0.79 0.86 0.78  CALCIUM 9.1 9.0 9.3    CXR (10/21/14): Increasing mild interstitial prominence which may reflect pulmonary edema or possibly atypical infection. RIGHT lung base atelectasis.  Results/Tests Pending at Time of Discharge: none  Discharge Medications:    Medication List    STOP taking these medications        cephALEXin 500 MG capsule  Commonly known as:  KEFLEX     metoprolol succinate 25 MG 24 hr tablet  Commonly known as:  TOPROL-XL      TAKE these medications        aspirin 81 MG EC tablet  Take 81 mg by mouth daily.     atorvastatin 40 MG tablet  Commonly known as:  LIPITOR  Take 1 tablet (40 mg total) by mouth daily.     carvedilol 6.25 MG tablet  Commonly known as:  COREG  Take 1 tablet (6.25 mg total) by mouth 2 (two) times daily with a meal.     furosemide 40 MG tablet  Commonly known as:  LASIX  Take 1 tablet (40 mg  total) by mouth daily.     glimepiride 4 MG tablet  Commonly known as:  AMARYL  Take 1 tablet (4 mg total) by mouth daily with breakfast.     losartan 50 MG tablet  Commonly known as:  COZAAR  Take 1 tablet (50 mg total) by mouth daily.     metFORMIN 1000 MG tablet  Commonly known as:  GLUCOPHAGE  Take 1 tablet (1,000 mg total) by mouth 2 (two) times daily with a meal.     ranitidine 150 MG capsule  Commonly  known as:  ZANTAC  Take 1 capsule (150 mg total) by mouth 2 (two) times daily.        Discharge Instructions: Please refer to Patient Instructions section of EMR for full details.  Patient was counseled important signs and symptoms that should prompt return to medical care, changes in medications, dietary instructions, activity restrictions, and follow up appointments.   Follow-Up Appointments: Follow-up Information    Follow up with Quintella Reichert, MD.   Specialty:  Cardiology   Why:  Office will contact you to arrange followup. Please call us if you do not hear from [place] in 2 days   Contact information:   [address] [place] [address] [phone]       Follow up with Joanna Puff, MD. Schedule an appointment as soon as possible for a visit in 2 weeks.   Specialty:  Family Medicine   Contact information:   [address] [place] [address] [phone]       Latrelle Dodrill, MD 10/23/2014, 9:37 AM PGY-3, [facility]

## 2014-10-23 NOTE — Progress Notes (Addendum)
Pt. discharged home, stated she doesn't feel she needs to any of this medications and will talk with Dr. Daleen Squibb about medication changes at her appt. Spoke with patient about importance about the medications adherence and the importance. IV and Tele removed. Questions answered.

## 2014-10-23 NOTE — Discharge Instructions (Signed)
You were admitted to the hospital with a heart failure exacerbation.  You will need to follow up with Dr. Daleen Squibb, your cardiologist. Please call his office and schedule an appointment within the next week. It was recommended by the cardiologists that you stop the metoprolol and switch to carvedilol (coreg). This new prescription has been sent in to your pharmacy.  What to do after you leave the hospital: Recommended diet: cardiac diet Recommended activity: activity as tolerated  Please seek medical attention if you experience any chest pain, shortness of breath, fevers, increased swelling, or any other concerns.

## 2014-10-23 NOTE — Progress Notes (Signed)
Family Medicine Teaching Service Daily Progress Note Intern Pager: [phone]  Patient name: Grace Ferrell Medical record number: [id number] Date of birth: 01-06-1966 Age: 49 y.o. Gender: female  Primary Care Provider: Joanna Puff, MD Consultants: Cardiology Code Status: full code (confirmed on admission)  Pt Overview and Major Events to Date:  1/1 - admit with dyspnea, acute CHF exacerbation  Assessment and Plan: Grace Ferrell is a 49 y.o. female presenting with dyspnea, cough, found to be hypoxic, suspected acute on CHF exacerbation. Significant recent history hospitalized 08/2014 for same complaint. PMH is significant for chronic systolic CHF (new dx in 08/2014, ECHO 35-40%) , HTN, T2DM, HLD, tobacco abuse.  # Dyspnea, suspected Acute CHF Exacerbation, in setting of Chronic Systolic CHF (reduced EF) On admission, presented with acute dyspnea within 24 hours, +orthopnea, cough, similar to presentation 08/2014 for hospitalized for same (CHF dx'd at that time), last ECHO 08/2014 (EF 35-40%, diffuse hypokinesis). Most likely acute CHF (+bibasilar crackles, hypoxia to 82% >> new O2 req 2L, CXR with mild pulm edema, ProBNP mild elevated 400s, unclear dry wt but near prior discharge wt) vs possible less likely PNA (?atypical PNA on CXR, afebrile, nml WBC) vs less likely COPD exac (no h/o COPD, but active tobacco abuse, non-productive cough). - transitioned to PO lasix yesterday. Total diuresis 4.6L since admission, with symptomatic improvement. Weight down 1lb. Creatinine stable, tolerating diuresis. Stable for discharge today - cardiology following, greatly appreciate assistance, planning for potential outpatient cath - await repeat echo, ordered but not done. Can be done as outpatient per cardiologist. - Daily weights, strict Is&Os, reassess volume status daily - troponins neg x 3. TSH NL.  - Supplemental O2 prn to maintain O2 sats >92% - not requiring O2 at present  #HTN: Stable. Currently  normotensive. - Continue home Losartan, beta blocker changed from metoprolol to coreg 6.25mg  BID per cardiology.  #HLD: Last lipid panel 10/05/14 with LDL 103.  - Continue home atorvastatin  and ASA  daily.  #T2DM: Well controlled. Last A1c (08/28/14) 6.0. - Holding home glimepiride and Metformin, resume on discharge - continue resistant SSI - Monitor CBGs  FEN/GI: Heart healthy/carb modified diet, SLIV Prophylaxis: SQ Heparin  Disposition: d/c home today  Subjective:  Feels ready to go home. Still displeased about switch from metoprolol to coreg. Has not had echo. No SOB or CP. No swelling.  Objective: Temp:  [97.4 F (36.3 C)-98.3 F (36.8 C)] 97.7 F (36.5 C) (01/03 0400) Pulse Rate:  [60-76] 60 (01/03 0400) Resp:  [14-20] 16 (01/03 0400) BP: (123-152)/(51-74) 146/57 mmHg (01/03 0400) SpO2:  [98 %-100 %] 98 % (01/03 0400) Weight:  [164 lb (74.39 kg)] 164 lb (74.39 kg) (01/03 0400) Physical Exam: General: NAD, pleasant, cooperative, sitting up in bed Cardiovascular: RRR no murmurs Respiratory: CTAB, NWOB Abdomen: soft, NTTP Extremities: No appreciable lower extremity edema bilaterally  Neuro: grossly nonfocal, speech normal  Laboratory:  Recent Labs Lab 10/21/14 0333 10/22/14 0315  WBC 9.6 7.5  HGB 11.8* 11.3*  HCT 35.7* 34.1*  PLT 256 254    Recent Labs Lab 10/21/14 0333 10/22/14 0315 10/23/14 0350  NA 140 140 138  K 3.4* 3.6 3.7  CL 105 103 103  CO2 BUN CREATININE 0.79 0.86 0.78  CALCIUM 9.1 9.0 9.3  GLUCOSE 197* 199* 240*    Imaging/Diagnostic Tests: CXR (10/21/14): Increasing mild interstitial prominence which may reflect pulmonary edema or possibly atypical infection. RIGHT lung base atelectasis.  [place]  Costella Hatcher, MD 10/23/2014, 7:07 AM PGY-3, [facility] FPTS Intern pager: [phone], text pages welcome

## 2014-10-23 NOTE — Progress Notes (Signed)
Pt refused insulin coverage

## 2014-10-26 ENCOUNTER — Encounter: Payer: Self-pay | Admitting: Cardiology

## 2014-10-26 ENCOUNTER — Ambulatory Visit: Payer: Self-pay | Attending: Cardiology | Admitting: Cardiology

## 2014-10-26 VITALS — BP 138/78 | HR 79 | Temp 98.4°F | Resp 20 | Ht 62.0 in | Wt 166.0 lb

## 2014-10-26 DIAGNOSIS — F17200 Nicotine dependence, unspecified, uncomplicated: Secondary | ICD-10-CM

## 2014-10-26 DIAGNOSIS — I5022 Chronic systolic (congestive) heart failure: Secondary | ICD-10-CM

## 2014-10-26 DIAGNOSIS — E782 Mixed hyperlipidemia: Secondary | ICD-10-CM

## 2014-10-26 DIAGNOSIS — E780 Pure hypercholesterolemia: Secondary | ICD-10-CM | POA: Insufficient documentation

## 2014-10-26 DIAGNOSIS — R05 Cough: Secondary | ICD-10-CM

## 2014-10-26 DIAGNOSIS — Z7982 Long term (current) use of aspirin: Secondary | ICD-10-CM | POA: Insufficient documentation

## 2014-10-26 DIAGNOSIS — E1169 Type 2 diabetes mellitus with other specified complication: Secondary | ICD-10-CM

## 2014-10-26 DIAGNOSIS — J209 Acute bronchitis, unspecified: Secondary | ICD-10-CM | POA: Insufficient documentation

## 2014-10-26 DIAGNOSIS — R0600 Dyspnea, unspecified: Secondary | ICD-10-CM | POA: Insufficient documentation

## 2014-10-26 DIAGNOSIS — I10 Essential (primary) hypertension: Secondary | ICD-10-CM | POA: Insufficient documentation

## 2014-10-26 DIAGNOSIS — R059 Cough, unspecified: Secondary | ICD-10-CM

## 2014-10-26 DIAGNOSIS — F17210 Nicotine dependence, cigarettes, uncomplicated: Secondary | ICD-10-CM | POA: Insufficient documentation

## 2014-10-26 DIAGNOSIS — Z79899 Other long term (current) drug therapy: Secondary | ICD-10-CM | POA: Insufficient documentation

## 2014-10-26 DIAGNOSIS — I340 Nonrheumatic mitral (valve) insufficiency: Secondary | ICD-10-CM

## 2014-10-26 DIAGNOSIS — E119 Type 2 diabetes mellitus without complications: Secondary | ICD-10-CM | POA: Insufficient documentation

## 2014-10-26 DIAGNOSIS — Z720 Tobacco use: Secondary | ICD-10-CM

## 2014-10-26 NOTE — Progress Notes (Signed)
Patient with history of chronic systolic heart failure, hypertension, diabetes mellitus type II Hospitalized 10/21/14-11/02/14 for CHF exacerbation Patient complaining today of chest tightness, productive cough, intermittent shortness of breath, and headaches Patient weighing herself daily and working on smoking cessation (she has not smoked since hospitalized).

## 2014-10-26 NOTE — Assessment & Plan Note (Signed)
Continue statin. LDL close to goal on last blood check.

## 2014-10-26 NOTE — Progress Notes (Signed)
HPI Grace Ferrell returns today after being admitted to the hospital on New Year's Day with acute bronchitis on top of chronic bronchitis from smoking. She presented with cough, dyspnea, and some minimal blood in her sputum.  Her weight had not changed at home on her daily scales weights. She did not have any peripheral edema and denied any orthopnea. It is unlikely she had much congestive heart failure.  She still has a cough and doesn't feel well. She still smoking 2-3 cigarettes a day. Her weight is been stable around 162 pounds since discharge. She has not started the carvedilol and stop Lopressor until she talked to me.  Note that she has moderate mitral regurgitation and moderate aortic insufficiency with a normal left ventricular chamber size with an ejection fraction of 35% in November 2015. I plan to follow that up 3 months after which would be early February. One was ordered in the hospital but did not get done.  Past Medical History  Diagnosis Date  . Hypertension   . Diabetes mellitus   . Hypercholesteremia     Current Outpatient Prescriptions  Medication Sig Dispense Refill  . aspirin 81 MG EC tablet Take 81 mg by mouth daily.      Marland Kitchen atorvastatin (LIPITOR) 40 MG tablet Take 1 tablet (40 mg total) by mouth daily. 90 tablet 3  . furosemide (LASIX) 40 MG tablet Take 1 tablet (40 mg total) by mouth daily. 30 tablet 2  . glimepiride (AMARYL) 4 MG tablet Take 1 tablet (4 mg total) by mouth daily with breakfast. 30 tablet 6  . losartan (COZAAR) 50 MG tablet Take 1 tablet (50 mg total) by mouth daily. 90 tablet 3  . metFORMIN (GLUCOPHAGE) 1000 MG tablet Take 1 tablet (1,000 mg total) by mouth 2 (two) times daily with a meal. 60 tablet 1  . ranitidine (ZANTAC) 150 MG capsule Take 1 capsule (150 mg total) by mouth 2 (two) times daily. 60 capsule 2  . carvedilol (COREG) 6.25 MG tablet Take 1 tablet (6.25 mg total) by mouth 2 (two) times daily with a meal. (Patient not taking: Reported on  10/26/2014) 60 tablet 2   No current facility-administered medications for this visit.    Allergies  Allergen Reactions  . Codeine     itching  . Darvocet [Propoxyphene N-Acetaminophen] Nausea And Vomiting  . Enalapril Cough    Dry cough  . Morphine And Related     itching  . Sulfamethoxazole-Trimethoprim     REACTION: developed itching 03-28-09 after one dose  . Vicodin [Hydrocodone-Acetaminophen] Itching    History reviewed. No pertinent family history.  History   Social History  . Marital Status: Single    Spouse Name: N/A    Number of Children: N/A  . Years of Education: N/A   Occupational History  . Not on file.   Social History Main Topics  . Smoking status: Current Every Day Smoker -- 0.50 packs/day for 21 years    Types: Cigarettes  . Smokeless tobacco: Current User  . Alcohol Use: No  . Drug Use: No  . Sexual Activity: Not on file   Other Topics Concern  . Not on file   Social History Narrative    ROS ALL NEGATIVE EXCEPT THOSE NOTED IN HPI  PE  General Appearance: well developed, well nourished in no acute distress HEENT: symmetrical face, PERRLA, good dentition  Neck: no JVD, thyromegaly, or adenopathy, trachea midline Chest: symmetric without deformity Cardiac: PMI non-displaced, RRR, normal S1, S2,  no gallop, 2/6 systolic murmur at the left lower sternal border and a very soft diastolic murmur there is well. I could not hear diastolic murmur at the left upper or right upper sternal border. Lung: Clear with occasional rhonchi Vascular: all pulses full without bruits  Abdominal: nondistended, nontender, good bowel sounds, no HSM, no bruits Extremities: no cyanosis, clubbing or edema, no sign of DVT, no varicosities  Skin: normal color, no rashes Neuro: alert and oriented x 3, non-focal Pysch: normal affect  EKG  BMET    Component Value Date/Time   NA 138 10/23/2014 0350   K 3.7 10/23/2014 0350   CL 103 10/23/2014 0350   CO2 29 10/23/2014  0350   GLUCOSE 240* 10/23/2014 0350   BUN 12 10/23/2014 0350   CREATININE 0.78 10/23/2014 0350   CREATININE 0.77 10/05/2014 1043   CALCIUM 9.3 10/23/2014 0350   GFRNONAA >90 10/23/2014 0350   GFRNONAA >89 10/05/2014 1043   GFRAA >90 10/23/2014 0350   GFRAA >89 10/05/2014 1043    Lipid Panel     Component Value Date/Time   CHOL 173 10/05/2014 1043   TRIG 151* 10/05/2014 1043   HDL 40 10/05/2014 1043   CHOLHDL 4.3 10/05/2014 1043   VLDL 30 10/05/2014 1043   LDLCALC 103* 10/05/2014 1043    CBC    Component Value Date/Time   WBC 7.5 10/22/2014 0315   WBC 8.8 07/31/2013 1312   RBC 4.27 10/22/2014 0315   RBC 4.95 07/31/2013 1312   HGB 11.3* 10/22/2014 0315   HGB 13.6 07/31/2013 1312   HCT 34.1* 10/22/2014 0315   HCT 42.3 07/31/2013 1312   PLT 254 10/22/2014 0315   MCV 79.9 10/22/2014 0315   MCV 85.5 07/31/2013 1312   MCH 26.5 10/22/2014 0315   MCH 27.5 07/31/2013 1312   MCHC 33.1 10/22/2014 0315   MCHC 32.2 07/31/2013 1312   RDW 15.4 10/22/2014 0315   LYMPHSABS 3.6 09/25/2014 1307   MONOABS 0.4 09/25/2014 1307   EOSABS 0.2 09/25/2014 1307   BASOSABS 0.0 09/25/2014 1307

## 2014-10-26 NOTE — Assessment & Plan Note (Signed)
She is smoking about 2-3 cigarettes a day. See note under chronic systolic heart failure. Strongly advised to quit altogether.

## 2014-10-26 NOTE — Assessment & Plan Note (Signed)
We'll repeat echocardiogram in early February to see if this has improved. We'll also need to look at left ventricular chamber size which is been normal on last echo in November 2015

## 2014-10-26 NOTE — Assessment & Plan Note (Signed)
This is a consequence of acute on chronic bronchitis. I have switched her from an ACE inhibitor to an ARB in the past.

## 2014-10-26 NOTE — Patient Instructions (Addendum)
It was great seeing you again today. Please begin taking Coreg 6.25 mg twice daily. Work to maintain weight between 162 lbs and 165 lbs. Take an additional lasix if your weight increases beyond 165 lbs. Continue working on smoking cessation. You can do it! Please return to see me in 2 weeks.  Thanks so much!

## 2014-10-26 NOTE — Assessment & Plan Note (Signed)
Without any change in her weight at home, it is not likely that she had much systolic heart failure during her admission. She was diuresed but doesn't feel better. Her lungs are clear today except for some rhonchi. She is still plagued with a cough and productive sputum. Most likely she has acute on chronic bronchitis.  I've recommended her starting carvedilol and stopping her Lopressor. She does have moderate mitral regurgitation as well as moderate aortic insufficiency but a normal size ventricle. She also has pulmonary hypertension from her chronic lung disease.  I will ask her to keep her weight between 162 and 165 at home. I've written a note for her to not be out looking for work over the next week. She also has been strongly encouraged to stop smoking altogether and the issue of acute on chronic bronchitis was explained at length. I've made no other changes in her medication.  I will see her back for close follow-up in 2 weeks on the 20th.  She has become extremely compliant except for the smoking.  I will repeat an echocardiogram in early February. If she still has a normal size ventricle and her valvular regurgitation we may need to consider further diagnostic workup.

## 2014-11-04 ENCOUNTER — Telehealth: Payer: Self-pay

## 2014-11-04 NOTE — Telephone Encounter (Signed)
Call placed to inform patient of lab results. Unable to reach patient; left voicemail requesting return call.

## 2014-11-04 NOTE — Telephone Encounter (Signed)
Patient informed of lab results. Patient appreciative of information.

## 2014-11-09 ENCOUNTER — Encounter: Payer: Self-pay | Admitting: Cardiology

## 2014-11-09 ENCOUNTER — Ambulatory Visit: Payer: Self-pay | Attending: Cardiology

## 2014-11-09 VITALS — BP 126/70 | HR 72 | Temp 97.9°F | Resp 20 | Ht 62.0 in | Wt 166.0 lb

## 2014-11-09 DIAGNOSIS — I5022 Chronic systolic (congestive) heart failure: Secondary | ICD-10-CM

## 2014-11-09 NOTE — Patient Instructions (Signed)
It was great seeing you again today. Dr. Daleen Squibb would like for you to take Losartan 50 mg daily as well as all of your other medications. Dr. Daleen Squibb also would like for you to have an echocardiogram done. This has been scheduled for 12/19/14 at 1000 at [facility]. Please go to [facility] [address] off of [address] and arrive 15 minutes early.

## 2014-11-09 NOTE — Progress Notes (Unsigned)
   Patient with history of chronic systolic heart failure, hypertension, diabetes mellitus type 2. Patient has been weighing herself daily and denies chest pain, swelling, shortness of breath, or headache. Patient indicates she is still smoking but is working on quitting. She is taking all medications except losartan which she indicates she is not taking. This was discussed with Dr. Daleen Squibb who indicated patient should be on Losartan 50 mg daily. Dr. Daleen Squibb also indicates patient needs a 2D echocardiogram without contrast scheduled at the end of February with a Cardiac f/u appointment the week following her echocardiogram.   Addendum-Order entered and echocardiogram scheduled for 12/19/14 at 1000. Patient aware of appointment. Patient also aware Dr. Daleen Squibb would like her to start taking her Losartan 50 mg daily again and to schedule follow-up appointment in about 7 weeks-after her echocardiogram completed.

## 2014-11-16 ENCOUNTER — Encounter: Payer: Self-pay | Admitting: Physician Assistant

## 2014-11-28 ENCOUNTER — Other Ambulatory Visit: Payer: Self-pay | Admitting: Family Medicine

## 2014-11-28 DIAGNOSIS — E119 Type 2 diabetes mellitus without complications: Secondary | ICD-10-CM

## 2014-11-28 DIAGNOSIS — E118 Type 2 diabetes mellitus with unspecified complications: Secondary | ICD-10-CM

## 2014-11-28 MED ORDER — METFORMIN HCL 1000 MG PO TABS: 1000.0000 mg | ORAL_TABLET | Freq: Two times a day (BID) | ORAL | Status: DC

## 2014-11-28 MED ORDER — GLIMEPIRIDE 4 MG PO TABS: 4.0000 mg | ORAL_TABLET | Freq: Every day | ORAL | Status: DC

## 2014-11-28 NOTE — Telephone Encounter (Signed)
Needs refill on metaformin and glimperide  [facility] [address]

## 2014-12-19 ENCOUNTER — Ambulatory Visit (HOSPITAL_COMMUNITY): Payer: Self-pay

## 2014-12-23 ENCOUNTER — Ambulatory Visit (HOSPITAL_COMMUNITY)
Admission: RE | Admit: 2014-12-23 | Discharge: 2014-12-23 | Disposition: A | Discharge status: Home / Self Care | Payer: MEDICAID | Source: Ambulatory Visit | Attending: Cardiology | Admitting: Cardiology

## 2014-12-23 DIAGNOSIS — I5022 Chronic systolic (congestive) heart failure: Secondary | ICD-10-CM | POA: Insufficient documentation

## 2014-12-23 DIAGNOSIS — I083 Combined rheumatic disorders of mitral, aortic and tricuspid valves: Secondary | ICD-10-CM | POA: Insufficient documentation

## 2014-12-23 NOTE — Progress Notes (Signed)
  Echocardiogram 2D Echocardiogram has been performed.  Grace Ferrell 12/23/2014, 1:32 PM

## 2014-12-30 ENCOUNTER — Encounter: Payer: Self-pay | Admitting: Family Medicine

## 2014-12-30 ENCOUNTER — Ambulatory Visit (INDEPENDENT_AMBULATORY_CARE_PROVIDER_SITE_OTHER): Payer: Self-pay | Admitting: Family Medicine

## 2014-12-30 VITALS — BP 105/70 | HR 82 | Ht 62.0 in | Wt 164.0 lb

## 2014-12-30 DIAGNOSIS — E119 Type 2 diabetes mellitus without complications: Secondary | ICD-10-CM

## 2014-12-30 DIAGNOSIS — F4321 Adjustment disorder with depressed mood: Secondary | ICD-10-CM

## 2014-12-30 LAB — POCT GLYCOSYLATED HEMOGLOBIN (HGB A1C): Hemoglobin A1C: 7.4

## 2014-12-30 MED ORDER — MELATONIN 1 MG PO CAPS: 1.0000 mg | ORAL_CAPSULE | Freq: Every day | ORAL | Status: DC

## 2014-12-30 NOTE — Progress Notes (Signed)
Patient ID: Grace Ferrell, female   DOB: [birth year], 49 y.o.   MRN: [id number]    Subjective: CC: "my mother passed away" HPI: Patient is a 49 y.o. female who initially scheduled this appt for abdominal pain which has resolved, but becomes tearful discussing her mother.   1. Grief: The patient's mother passed away on Valentines weekend. Per the patient, she actually found her mother on the floor, called 911, and began CPR. She recalls her sister banging on the door to let her in however the 911 responder telling her not to stop CPR. She tells me she is frustrated, because her mother had recently celebrated her 75th birthday and had actually gone to the emergency room that day and felt comforted that nothing was wrong with her. The patient states that she continues to have nightmares about the event. She states that she is not sleeping well, has had decreased appetite, has been staying home more, and has not been taking her CHF medications appropriately. She tells me she no she needs to take her medications, and will begin to do so.  At this time, she would like something to help her sleep, but declines anything for depression. The patient denies any suicidal ideation, homicidal ideation, AVH, or self-harm ideas. She states that she spoke to someone who tells her she probably has "survivor's guilt" because she wasn't there to help her mother.]  She does not wish to talk about anything else today but tells me she will return.  Social History: Has been smoking more due to current evidence  ROS: All other systems reviewed and are negative.  Past Medical History Patient Active Problem List   Diagnosis Date Noted  . Adjustment disorder with depressed mood 12/30/2014  . Acute exacerbation of CHF (congestive heart failure) 10/21/2014  . Moderate mitral regurgitation by prior echocardiogram 10/21/2014  . Moderate aortic regurgitation 10/21/2014  . Dyspnea   . Pleuritic chest pain   . Essential  hypertension   . Chronic systolic heart failure 08/28/2014  . Menopause 04/11/2012  . Chronic sinusitis 04/11/2012  . Mixed hyperlipidemia due to type 2 diabetes mellitus 03/12/2012  . Chronic pain syndrome 05/21/2011  . GOITER, UNSPECIFIED 01/08/2010  . TOBACCO USER 07/29/2009  . DM2 (diabetes mellitus, type 2) 12/18/2006  . DEPRESSIVE DISORDER, NOS 12/18/2006    Medications- reviewed and updated Current Outpatient Prescriptions  Medication Sig Dispense Refill  . aspirin 81 MG EC tablet Take 81 mg by mouth daily.      Marland Kitchen atorvastatin (LIPITOR) 40 MG tablet Take 1 tablet (40 mg total) by mouth daily. 90 tablet 3  . carvedilol (COREG) 6.25 MG tablet Take 1 tablet (6.25 mg total) by mouth 2 (two) times daily with a meal. 60 tablet 2  . furosemide (LASIX) 40 MG tablet Take 1 tablet (40 mg total) by mouth daily. 30 tablet 2  . glimepiride (AMARYL) 4 MG tablet Take 1 tablet (4 mg total) by mouth daily with breakfast. 30 tablet 6  . losartan (COZAAR) 50 MG tablet Take 1 tablet (50 mg total) by mouth daily. (Patient not taking: Reported on 11/09/2014) 90 tablet 3  . Melatonin 1 MG CAPS Take 1 capsule (1 mg total) by mouth at bedtime. 30 capsule 2  . metFORMIN (GLUCOPHAGE) 1000 MG tablet Take 1 tablet (1,000 mg total) by mouth 2 (two) times daily with a meal. 60 tablet 1  . ranitidine (ZANTAC) 150 MG capsule Take 1 capsule (150 mg total) by mouth 2 (two) times daily.  60 capsule 2   No current facility-administered medications for this visit.    Objective: Office vital signs reviewed. BP 105/70 mmHg  Pulse 82  Ht 5\' 2"  (1.575 m)  Wt 164 lb (74.39 kg)  BMI 29.99 kg/m2   Physical Examination:  General: Awake, alert, very tearful on exam Cardio: RRR, 2/6 systolic murmur. Trace pitting edema bilaterally Psych: Tearful, stated mood sad, affect congruent. No SI, HI, AVH  Assessment/Plan: No problem-specific assessment & plan notes found for this encounter.   Orders Placed This Encounter    Procedures  . POCT A1C    Meds ordered this encounter  Medications  . Melatonin 1 MG CAPS    Sig: Take 1 capsule (1 mg total) by mouth at bedtime.    Dispense:  30 capsule    Refill:  2    Joanna Puff PGY-1, [facility]

## 2014-12-30 NOTE — Assessment & Plan Note (Signed)
The patient is currently presenting with depressed mood, decreased energy, insomnia, and nightmares following the death of her mother approximately one month ago. Currently not having any SI, HI, or AVH.  -Patient uninterested in antidepressants at this time -We'll start melatonin to see if this helps with her sleep, and if improved sleep and time helps with her mood. -In the future if patient continues to have anorexia, could consider Remeron for depression, appetite improvement, and sleep, however would not consider this long-term given the patient's cardiac history. -Discussed reasons to return to clinic or seek medical attention. -Patient to follow-up in one month or sooner as necessary

## 2014-12-30 NOTE — Patient Instructions (Signed)
Grief Reaction Grief is a normal response to the death of someone close to you. Feelings of fear, anger, and guilt can affect almost everyone who loses someone they love. Symptoms of depression are also common. These include problems with sleep, loss of appetite, and lack of energy. These grief reaction symptoms often last for weeks to months after a loss. They may also return during special times that remind you of the person you lost, such as an anniversary or birthday. Anxiety, insomnia, irritability, and deep depression may last beyond the period of normal grief. If you experience these feelings for 6 months or longer, you may have clinical depression. Clinical depression requires further medical attention. If you think that you have clinical depression, you should contact your caregiver. If you have a history of depression or a family history of depression, you are at greater risk of clinical depression. You are also at greater risk of developing clinical depression if the loss was traumatic or the loss was of someone with whom you had unresolved issues.  A grief reaction can become complicated by being blocked. This means being unable to cry or express extreme emotions. This may prolong the grieving period and worsen the emotional effects of the loss. Mourning is a natural event in human life. A healthy grief reaction is one that is not blocked. It requires a time of sadness and readjustment. It is very important to share your sorrow and fear with others, especially close friends and family. Professional counselors and clergy can also help you process your grief. Document Released: 10/07/2005 Document Revised: 02/21/2014 Document Reviewed: 06/17/2006 Exit[employer] Patient Information 2015 [employer]. This information is not intended to replace advice given to you by your health care provider. Make sure you discuss any questions you have with your health care provider.  

## 2015-01-12 ENCOUNTER — Other Ambulatory Visit: Payer: Self-pay | Admitting: Family Medicine

## 2015-01-12 NOTE — Telephone Encounter (Signed)
Please let the patient know I have refilled her Rx for Lasix x 1 refill.  Please have her make a f/u appt with me in 1-2 months.  Thanks, Joanna Puff, MD [facility] Resident  01/12/2015, 10:32 AM

## 2015-01-23 ENCOUNTER — Other Ambulatory Visit: Payer: Self-pay | Admitting: Family Medicine

## 2015-01-30 ENCOUNTER — Other Ambulatory Visit: Payer: Self-pay | Admitting: Family Medicine

## 2015-01-30 MED ORDER — METFORMIN HCL 1000 MG PO TABS: 1000.0000 mg | ORAL_TABLET | Freq: Two times a day (BID) | ORAL | Status: DC

## 2015-02-20 ENCOUNTER — Other Ambulatory Visit: Payer: Self-pay | Admitting: Family Medicine

## 2015-03-21 ENCOUNTER — Other Ambulatory Visit: Payer: Self-pay | Admitting: Family Medicine

## 2015-03-21 NOTE — Telephone Encounter (Signed)
Lasix refilled. Thanks, Joanna Puff, MD [facility] Resident  03/21/2015, 4:43 PM

## 2015-04-25 ENCOUNTER — Other Ambulatory Visit: Payer: Self-pay | Admitting: Cardiology

## 2015-04-25 ENCOUNTER — Other Ambulatory Visit: Payer: Self-pay | Admitting: Family Medicine

## 2015-05-05 ENCOUNTER — Ambulatory Visit: Payer: Self-pay

## 2015-06-01 ENCOUNTER — Other Ambulatory Visit: Payer: Self-pay | Admitting: Family Medicine

## 2015-06-05 ENCOUNTER — Other Ambulatory Visit: Payer: Self-pay | Admitting: Cardiology

## 2015-06-06 ENCOUNTER — Other Ambulatory Visit: Payer: Self-pay | Admitting: Cardiology

## 2015-06-24 ENCOUNTER — Other Ambulatory Visit: Payer: Self-pay | Admitting: Family Medicine

## 2015-06-26 NOTE — Telephone Encounter (Signed)
Rx filled for 3 month supply. Please have the pt make an appt with me prior to any more refills.  Thanks, Joanna Puff, MD [facility] Medicine Resident  06/26/2015, 2:54 PM

## 2015-07-19 ENCOUNTER — Ambulatory Visit: Payer: Self-pay | Attending: Cardiology | Admitting: Cardiology

## 2015-07-19 ENCOUNTER — Encounter: Payer: Self-pay | Admitting: Cardiology

## 2015-07-19 VITALS — BP 131/80 | HR 64 | Temp 98.3°F | Resp 16 | Ht 62.0 in | Wt 167.8 lb

## 2015-07-19 DIAGNOSIS — Z6830 Body mass index (BMI) 30.0-30.9, adult: Secondary | ICD-10-CM | POA: Insufficient documentation

## 2015-07-19 DIAGNOSIS — I351 Nonrheumatic aortic (valve) insufficiency: Secondary | ICD-10-CM

## 2015-07-19 DIAGNOSIS — Z9119 Patient's noncompliance with other medical treatment and regimen: Secondary | ICD-10-CM | POA: Insufficient documentation

## 2015-07-19 DIAGNOSIS — I340 Nonrheumatic mitral (valve) insufficiency: Secondary | ICD-10-CM

## 2015-07-19 DIAGNOSIS — E669 Obesity, unspecified: Secondary | ICD-10-CM | POA: Insufficient documentation

## 2015-07-19 DIAGNOSIS — E119 Type 2 diabetes mellitus without complications: Secondary | ICD-10-CM | POA: Insufficient documentation

## 2015-07-19 DIAGNOSIS — E782 Mixed hyperlipidemia: Secondary | ICD-10-CM | POA: Insufficient documentation

## 2015-07-19 DIAGNOSIS — I5022 Chronic systolic (congestive) heart failure: Secondary | ICD-10-CM | POA: Insufficient documentation

## 2015-07-19 DIAGNOSIS — Z720 Tobacco use: Secondary | ICD-10-CM | POA: Insufficient documentation

## 2015-07-19 DIAGNOSIS — E1169 Type 2 diabetes mellitus with other specified complication: Secondary | ICD-10-CM

## 2015-07-19 NOTE — Assessment & Plan Note (Signed)
This is stable on her last echocardiogram. She is asymptomatic and the left ventricular chamber size is normal. Continue good blood pressure control. Follow with me in 6 months.

## 2015-07-19 NOTE — Patient Instructions (Addendum)
Thank you for visiting with Dr. Daleen Squibb today. Continue to lessen your smoking habit.

## 2015-07-19 NOTE — Progress Notes (Signed)
Grace Ferrell returns today for evaluation and management of her history of chronic systolic heart failure. This is probably secondary to hypertension and hypertensive heart disease. She also has type 2 diabetes mellitus with a history of noncompliance, obesity, and tobacco use. She has a history of mixed hyperlipidemia this been very responsive to 40 mg of atorvastatin.  She had a repeat echocardiogram after being compliant with her medications and her EF is now 55%. She had minimal diastolic dysfunction. She still has moderate aortic insufficiency and mild mitral regurgitation. Left ventricle left atrium are normal in size.  She denies any chest pain, shortness of breath, orthopnea, PND or edema. She is still smoking 2 packs of cigarettes a week. This is  dramatically lower than what she used to smoke.  Exam today shows her to be very pleasant and in no acute distress. She is obese. Neck exam shows no JVD. Carotid upstrokes are equal bilaterally without bruits. Lungs are clear to auscultation. Heart exam reveals a poorly appreciated PMI normal S1-S2 without gallop. She has a soft diastolic murmur along left sternal border. Extremities no cyanosis clubbing or edema.

## 2015-07-19 NOTE — Progress Notes (Signed)
Pt here for follow up for CHF. Pt denies any pain today. Pt reports pain in her left arm that has been occuring for a few weeks now. Pt states when she does have pain it occurs whenever she is doing anything with it. Pt describes the pain as "just hurts". Pt has taken her medications today and does not need any refills. Pt reports she is taking lipitor but plans to stop taking it because she thinks it causes her to have weakness and muscle soreness in her left arm. Pt smokes .25 packs of cigarettes/day.

## 2015-07-19 NOTE — Assessment & Plan Note (Signed)
Her last echocardiogram showed mild mitral regurgitation. She also has normal left atrial size. Continue good blood pressure control.

## 2015-07-19 NOTE — Assessment & Plan Note (Signed)
Her lipids are close to goal on atorvastatin 40 mg a day. I've emphasized taking all her medications including her diabetic meds to lower her hemoglobin A1c to 7% or lower. She'll continue with aspirin and her hypertensive meds including her ARB. I've encouraged her to stop smoking and to keep her weight stable.

## 2015-07-20 ENCOUNTER — Ambulatory Visit: Payer: Self-pay

## 2015-07-25 ENCOUNTER — Ambulatory Visit: Payer: Self-pay | Admitting: Family Medicine

## 2015-08-21 ENCOUNTER — Other Ambulatory Visit: Payer: Self-pay | Admitting: Family Medicine

## 2015-09-28 ENCOUNTER — Other Ambulatory Visit: Payer: Self-pay | Admitting: Cardiology

## 2015-10-06 ENCOUNTER — Other Ambulatory Visit: Payer: Self-pay | Admitting: Family Medicine

## 2015-10-09 NOTE — Telephone Encounter (Signed)
Glimepiride refilled x 2 month supply.  Thanks, Joanna Puff, MD [facility] Family Medicine Resident  10/09/2015, 12:51 PM

## 2015-10-09 NOTE — Telephone Encounter (Signed)
Pt called to make an appointment for 10/26/15. This is the first available for Dr. Leonides Schanz. Can we fill the patients Glimepiride enough to last. jw

## 2015-10-26 ENCOUNTER — Ambulatory Visit (INDEPENDENT_AMBULATORY_CARE_PROVIDER_SITE_OTHER): Payer: Self-pay | Admitting: Family Medicine

## 2015-10-26 ENCOUNTER — Encounter: Payer: Self-pay | Admitting: Family Medicine

## 2015-10-26 VITALS — BP 126/59 | HR 76 | Temp 97.9°F | Resp 18 | Ht 62.0 in | Wt 156.4 lb

## 2015-10-26 DIAGNOSIS — E119 Type 2 diabetes mellitus without complications: Secondary | ICD-10-CM

## 2015-10-26 DIAGNOSIS — R059 Cough, unspecified: Secondary | ICD-10-CM

## 2015-10-26 DIAGNOSIS — I5022 Chronic systolic (congestive) heart failure: Secondary | ICD-10-CM

## 2015-10-26 DIAGNOSIS — R05 Cough: Secondary | ICD-10-CM

## 2015-10-26 DIAGNOSIS — I10 Essential (primary) hypertension: Secondary | ICD-10-CM

## 2015-10-26 LAB — BASIC METABOLIC PANEL
BUN: 8 mg/dL (ref 7–25)
CO2: 25 mmol/L (ref 20–31)
Calcium: 9.6 mg/dL (ref 8.6–10.4)
Chloride: 101 mmol/L (ref 98–110)
Creat: 1.19 mg/dL — ABNORMAL HIGH (ref 0.50–1.05)
GLUCOSE: 191 mg/dL — AB (ref 65–99)
Potassium: 3.4 mmol/L — ABNORMAL LOW (ref 3.5–5.3)
Sodium: 139 mmol/L (ref 135–146)

## 2015-10-26 LAB — POCT GLYCOSYLATED HEMOGLOBIN (HGB A1C): HEMOGLOBIN A1C: 7.8

## 2015-10-26 MED ORDER — LOSARTAN POTASSIUM 50 MG PO TABS: 50.0000 mg | ORAL_TABLET | Freq: Every day | ORAL | Status: DC

## 2015-10-26 MED ORDER — ASPIRIN 81 MG PO TBEC: 81.0000 mg | DELAYED_RELEASE_TABLET | Freq: Every day | ORAL | Status: DC

## 2015-10-26 MED ORDER — CARVEDILOL 6.25 MG PO TABS: ORAL_TABLET | ORAL | Status: DC

## 2015-10-26 MED ORDER — ATORVASTATIN CALCIUM 40 MG PO TABS: 40.0000 mg | ORAL_TABLET | Freq: Every day | ORAL | Status: DC

## 2015-10-26 MED ORDER — FUROSEMIDE 40 MG PO TABS: 40.0000 mg | ORAL_TABLET | Freq: Every day | ORAL | Status: DC

## 2015-10-26 MED ORDER — METFORMIN HCL 1000 MG PO TABS: 1000.0000 mg | ORAL_TABLET | Freq: Two times a day (BID) | ORAL | Status: DC

## 2015-10-26 MED ORDER — GLIMEPIRIDE 4 MG PO TABS: ORAL_TABLET | ORAL | Status: DC

## 2015-10-26 MED ORDER — RANITIDINE HCL 150 MG PO CAPS: 150.0000 mg | ORAL_CAPSULE | Freq: Two times a day (BID) | ORAL | Status: DC

## 2015-10-26 NOTE — Assessment & Plan Note (Addendum)
Blood pressure at goal today with losartan and lasix - continue current regimen - BMET today

## 2015-10-26 NOTE — Assessment & Plan Note (Addendum)
A1c actually increased slightly over the holidays to 7.8, despite weight loss. I suspect part of this is from medication non-compliance. Patient denies any further barriers to obtaining her medication.  - congratulated patient on weight loss - continued to stress good diet and exercise  - continue metformin and Amaryl - Placed referral for retinal eye exam. - will follow up in 3 months for repeat A1c.

## 2015-10-26 NOTE — Assessment & Plan Note (Signed)
Last echo with improved EF. No evidence of fluid overload. New dry weight is around 156-159lbs.  - continue coreg, losartan, lasix  - continue ASA and Lipitor.

## 2015-10-26 NOTE — Progress Notes (Signed)
Patient here for A1C FU.  Patient denies pain at this time.  Patient is grieving loss of her mother.

## 2015-10-26 NOTE — Progress Notes (Signed)
Patient ID: Grace Ferrell, female   DOB: 07/02/1966, 50 y.o.   MRN: [id number]    Subjective: CC: f/u diabetes  HPI: Patient is a 50 y.o. female with a past medical history of systolic CHF, HTN, and DM presenting to clinic today for a f/u DM.  Diabetes:  CBGs at home: doesn't take Taking medications:  Taking Amaryl and metformin but intermittently due to financial restraints  Side effects: None  ROS: denies fever, chills, dizziness, LOC, polyuria, polydipsia, numbness or tingling in extremities or chest pain. Last eye exam:  Patient knows its time for  Last A1c: 7.4 on 3/16   CHF No chest pain, SOB, PND, orthopnea, or LE swelling. Complaint with Lasix daily. Concerned because she lost weight- noted the cardiologist wanted her to stay between 165-167lbs. We dicussed dry weights and that it was good she was losing weight through diet and exercise.   Social History: smoking more since the death of her mother as a way to cope. Starting to cope much better with this after selling her mother's house and it is all more "real."  Health Maintenance: declines flu vaccine today  ROS: All other systems reviewed and are negative.  Past Medical History Patient Active Problem List   Diagnosis Date Noted  . Chronic systolic heart failure (HCC) 10/26/2015  . Adjustment disorder with depressed mood 12/30/2014  . Acute exacerbation of CHF (congestive heart failure) (HCC) 10/21/2014  . Moderate mitral regurgitation by prior echocardiogram 10/21/2014  . Moderate aortic regurgitation 10/21/2014  . Dyspnea   . Pleuritic chest pain   . Essential hypertension   . Menopause 04/11/2012  . Chronic sinusitis 04/11/2012  . Mixed hyperlipidemia due to type 2 diabetes mellitus (HCC) 03/12/2012  . Chronic pain syndrome 05/21/2011  . GOITER, UNSPECIFIED 01/08/2010  . TOBACCO USER 07/29/2009  . DM2 (diabetes mellitus, type 2) (HCC) 12/18/2006  . DEPRESSIVE DISORDER, NOS 12/18/2006    Medications- reviewed and  updated Current Outpatient Prescriptions  Medication Sig Dispense Refill  . aspirin 81 MG EC tablet Take 1 tablet (81 mg total) by mouth daily. 90 tablet 3  . atorvastatin (LIPITOR) 40 MG tablet Take 1 tablet (40 mg total) by mouth daily. 90 tablet 3  . carvedilol (COREG) 6.25 MG tablet TAKE 1 TABLET BY MOUTH 2 TIMES DAILY WITH A MEAL 60 tablet 3  . furosemide (LASIX) 40 MG tablet Take 1 tablet (40 mg total) by mouth daily. 30 tablet 3  . glimepiride (AMARYL) 4 MG tablet TAKE 1 TABLET (4 MG TOTAL) BY MOUTH DAILY WITH BREAKFAST. 30 tablet 3  . losartan (COZAAR) 50 MG tablet Take 1 tablet (50 mg total) by mouth daily. 90 tablet 3  . metFORMIN (GLUCOPHAGE) 1000 MG tablet TAKE 1 TABLET (1,000 MG TOTAL) BY MOUTH 2 (TWO) TIMES DAILY WITH A MEAL. 180 tablet 0  . metFORMIN (GLUCOPHAGE) 1000 MG tablet Take 1 tablet (1,000 mg total) by mouth 2 (two) times daily with a meal. 180 tablet 1  . ranitidine (ZANTAC) 150 MG capsule Take 1 capsule (150 mg total) by mouth 2 (two) times daily. 60 capsule 2  . Melatonin 1 MG CAPS Take 1 capsule (1 mg total) by mouth at bedtime. (Patient not taking: Reported on 10/26/2015) 30 capsule 2   No current facility-administered medications for this visit.    Objective: Office vital signs reviewed. BP 126/59 mmHg  Pulse 76  Temp(Src) 97.9 F (36.6 C) (Oral)  Resp 18  Ht 5\' 2"  (1.575 m)  Wt 156  lb 6.4 oz (70.943 kg)  BMI 28.60 kg/m2  SpO2 100%   Physical Examination:  General: Awake, alert, well- nourished, NAD Cardio: RRR, I/VI diastolic murmur, no rubs or gallops. No JVD. No pitting edema noted. Pulm: No increased WOB.  CTAB, without wheezes, rhonchi or crackles noted.  MSK: Normal gait and station Skin: dry, intact, no rashes or lesions  A1c 7.8  Assessment/Plan: Essential hypertension Blood pressure at goal today with losartan and lasix - continue current regimen - BMET today   DM2 (diabetes mellitus, type 2) A1c actually increased slightly over the  holidays to 7.8, despite weight loss. I suspect part of this is from medication non-compliance. Patient denies any further barriers to obtaining her medication.  - congratulated patient on weight loss - continued to stress good diet and exercise  - continue metformin and Amaryl - Placed referral for retinal eye exam. - will follow up in 3 months for repeat A1c.   Chronic systolic heart failure (HCC) Last echo with improved EF. No evidence of fluid overload. New dry weight is around 156-159lbs.  - continue coreg, losartan, lasix  - continue ASA and Lipitor.     Orders Placed This Encounter  Procedures  . Basic Metabolic Panel  . Ambulatory referral to Ophthalmology    Referral Priority:  Routine    Referral Type:  Consultation    Referral Reason:  Specialty Services Required    Requested Specialty:  Ophthalmology    Number of Visits Requested:  1  . HgB A1c    Meds ordered this encounter  Medications  . aspirin 81 MG EC tablet    Sig: Take 1 tablet (81 mg total) by mouth daily.    Dispense:  90 tablet    Refill:  3  . atorvastatin (LIPITOR) 40 MG tablet    Sig: Take 1 tablet (40 mg total) by mouth daily.    Dispense:  90 tablet    Refill:  3  . carvedilol (COREG) 6.25 MG tablet    Sig: TAKE 1 TABLET BY MOUTH 2 TIMES DAILY WITH A MEAL    Dispense:  60 tablet    Refill:  3  . furosemide (LASIX) 40 MG tablet    Sig: Take 1 tablet (40 mg total) by mouth daily.    Dispense:  30 tablet    Refill:  3  . glimepiride (AMARYL) 4 MG tablet    Sig: TAKE 1 TABLET (4 MG TOTAL) BY MOUTH DAILY WITH BREAKFAST.    Dispense:  30 tablet    Refill:  3  . losartan (COZAAR) 50 MG tablet    Sig: Take 1 tablet (50 mg total) by mouth daily.    Dispense:  90 tablet    Refill:  3  . metFORMIN (GLUCOPHAGE) 1000 MG tablet    Sig: Take 1 tablet (1,000 mg total) by mouth 2 (two) times daily with a meal.    Dispense:  180 tablet    Refill:  1  . ranitidine (ZANTAC) 150 MG capsule    Sig: Take  1 capsule (150 mg total) by mouth 2 (two) times daily.    Dispense:  60 capsule    Refill:  2    Joanna Puff PGY-2, [facility]

## 2015-10-26 NOTE — Patient Instructions (Addendum)
New dry weight (weight without any fluid on board) is 156. Your range will be between 156-159.  If you gain more than 4 pounds in 2 days or 3 pounds in 1 day, please call us or the cardiologist.  If you notice that you're not coping as well as you'd like with your mother's passing, you can make an appointment with our integrated care team (behavioral health specialities) that we have here at the clinic.

## 2015-11-06 ENCOUNTER — Encounter: Payer: Self-pay | Admitting: Family Medicine

## 2015-11-07 ENCOUNTER — Telehealth: Payer: Self-pay | Admitting: *Deleted

## 2015-11-07 NOTE — Telephone Encounter (Signed)
Called patient to offer flu vaccine, however, VM picked up.  Left message on patient's voice mail to return call. Josemiguel Gries L, RN   

## 2015-11-27 ENCOUNTER — Encounter: Payer: Self-pay | Admitting: Family Medicine

## 2015-11-27 ENCOUNTER — Ambulatory Visit (INDEPENDENT_AMBULATORY_CARE_PROVIDER_SITE_OTHER): Payer: Self-pay | Admitting: Family Medicine

## 2015-11-27 VITALS — BP 119/70 | HR 98 | Temp 98.7°F | Wt 161.0 lb

## 2015-11-27 DIAGNOSIS — J0100 Acute maxillary sinusitis, unspecified: Secondary | ICD-10-CM

## 2015-11-27 DIAGNOSIS — I10 Essential (primary) hypertension: Secondary | ICD-10-CM

## 2015-11-27 DIAGNOSIS — Z23 Encounter for immunization: Secondary | ICD-10-CM

## 2015-11-27 MED ORDER — MUPIROCIN CALCIUM 2 % NA OINT: 1.0000 "application " | TOPICAL_OINTMENT | Freq: Two times a day (BID) | NASAL | Status: DC

## 2015-11-27 MED ORDER — AMOXICILLIN 500 MG PO CAPS: 500.0000 mg | ORAL_CAPSULE | Freq: Three times a day (TID) | ORAL | Status: DC

## 2015-11-27 NOTE — Patient Instructions (Signed)

## 2015-11-27 NOTE — Progress Notes (Signed)
Patient ID: Grace Ferrell, female   DOB: 1965-12-11, 50 y.o.   MRN: [id number]    Subjective: CC: sinus pressure HPI: Patient is a 50 y.o. female with a past medical history of HTN and CHF presenting to clinic today for sinus congestion.  Approximately 3 weeks ago, she started noticing sinus pressure started around the nose, went to maxillary region, and now feels like it is all over he race. She has had rhinorrhea as well.  She feels like sinuses have gotten worse over the past few weeks . She endorses frontal headaches on ROS. She denies  coughing, fevers, chills, SOB, chest congestion, chest pain, otalgias, or sore throat.   Last week she noticed bright red blood when blowing her nose. She subsequently noticed a bump in the right nostril that is painful which is why she made the appointment.   Tylenol sinus severe did not help her symptoms at all.   Social History: current smoker  Health Maintenance: due for flu vaccine, will f/u for health maintenance such as colonoscopy.  ROS: All other systems reviewed and are negative except that noted in HPI.   Past Medical History Patient Active Problem List   Diagnosis Date Noted  . Acute sinusitis 11/28/2015  . Chronic systolic heart failure (HCC) 10/26/2015  . Adjustment disorder with depressed mood 12/30/2014  . Acute exacerbation of CHF (congestive heart failure) (HCC) 10/21/2014  . Moderate mitral regurgitation by prior echocardiogram 10/21/2014  . Moderate aortic regurgitation 10/21/2014  . Dyspnea   . Pleuritic chest pain   . Essential hypertension   . Menopause 04/11/2012  . Chronic sinusitis 04/11/2012  . Mixed hyperlipidemia due to type 2 diabetes mellitus (HCC) 03/12/2012  . Chronic pain syndrome 05/21/2011  . GOITER, UNSPECIFIED 01/08/2010  . TOBACCO USER 07/29/2009  . DM2 (diabetes mellitus, type 2) (HCC) 12/18/2006  . DEPRESSIVE DISORDER, NOS 12/18/2006    Medications- reviewed and updated Current Outpatient  Prescriptions  Medication Sig Dispense Refill  . amoxicillin (AMOXIL) 500 MG capsule Take 1 capsule (500 mg total) by mouth 3 (three) times daily. 15 capsule 0  . aspirin 81 MG EC tablet Take 1 tablet (81 mg total) by mouth daily. 90 tablet 3  . atorvastatin (LIPITOR) 40 MG tablet Take 1 tablet (40 mg total) by mouth daily. 90 tablet 3  . carvedilol (COREG) 6.25 MG tablet TAKE 1 TABLET BY MOUTH 2 TIMES DAILY WITH A MEAL 60 tablet 3  . furosemide (LASIX) 40 MG tablet Take 1 tablet (40 mg total) by mouth daily. 30 tablet 3  . glimepiride (AMARYL) 4 MG tablet TAKE 1 TABLET (4 MG TOTAL) BY MOUTH DAILY WITH BREAKFAST. 30 tablet 3  . losartan (COZAAR) 50 MG tablet Take 1 tablet (50 mg total) by mouth daily. 90 tablet 3  . Melatonin 1 MG CAPS Take 1 capsule (1 mg total) by mouth at bedtime. (Patient not taking: Reported on 10/26/2015) 30 capsule 2  . metFORMIN (GLUCOPHAGE) 1000 MG tablet TAKE 1 TABLET (1,000 MG TOTAL) BY MOUTH 2 (TWO) TIMES DAILY WITH A MEAL. 180 tablet 0  . metFORMIN (GLUCOPHAGE) 1000 MG tablet Take 1 tablet (1,000 mg total) by mouth 2 (two) times daily with a meal. 180 tablet 1  . mupirocin nasal ointment (BACTROBAN NASAL) 2 % Place 1 application into the nose 2 (two) times daily. Use a small amount in the right nostril twice daily for 7 days total. 10 g 0  . ranitidine (ZANTAC) 150 MG capsule Take 1 capsule (150  mg total) by mouth 2 (two) times daily. 60 capsule 2   No current facility-administered medications for this visit.    Objective: Office vital signs reviewed. BP 119/70 mmHg  Pulse 98  Temp(Src) 98.7 F (37.1 C) (Oral)  Wt 161 lb (73.029 kg)   Physical Examination:  General: Awake, alert, well- nourished, NAD ENMT:  TMs intact, normal light reflex, no erythema, no bulging. Nasal turbinates moist. Small scab in the base of the right nostril, hemostatic.  MMM, Oropharynx clear without erythema or tonsillar exudate/hypertrophy Eyes: Conjunctiva non-injected. PERRL.    Significant tenderness to palpation over the maxillary region. No tenderness to palpation over the TMJ  Cardio: RRR, no m/r/g noted. No pitting edema  Pulm: No increased WOB.  CTAB, without wheezes, rhonchi or crackles noted.   Assessment/Plan: Acute sinusitis Patient presenting with 3 weeks of sinus congestion, rhinorrhea, and sinus pressure without associated symptoms. Concerns for bacterial infection given the duration of symptoms. Patient is self pay and would therefore like to try an antibiotic on the $4 list. Non-toxic on exam. Also noted to have a small scab in her right nostril that is tender, however is hemostatic and does not appear infected. - amoxicillin 500mg  TID x 7 days  - bactroban nasal ointment- to place a small amount over the bump twice daily for 7 days. If no improvement or worsening, patient to follow back up with [place].  - discussed using nasal saline and saline washes as well. - RTC precautions discussed.      Orders Placed This Encounter  Procedures  . Flu Vaccine QUAD 36+ mos IM  . Basic Metabolic Panel    Meds ordered this encounter  Medications  . amoxicillin (AMOXIL) 500 MG capsule    Sig: Take 1 capsule (500 mg total) by mouth 3 (three) times daily.    Dispense:  15 capsule    Refill:  0  . mupirocin nasal ointment (BACTROBAN NASAL) 2 %    Sig: Place 1 application into the nose 2 (two) times daily. Use a small amount in the right nostril twice daily for 7 days total.    Dispense:  10 g    Refill:  0    Joanna Puff PGY-2, [facility] Medicine

## 2015-11-28 ENCOUNTER — Encounter: Payer: Self-pay | Admitting: Family Medicine

## 2015-11-28 DIAGNOSIS — J019 Acute sinusitis, unspecified: Secondary | ICD-10-CM | POA: Insufficient documentation

## 2015-11-28 DIAGNOSIS — J0110 Acute frontal sinusitis, unspecified: Secondary | ICD-10-CM | POA: Insufficient documentation

## 2015-11-28 LAB — BASIC METABOLIC PANEL
BUN: 10 mg/dL (ref 7–25)
CHLORIDE: 101 mmol/L (ref 98–110)
CO2: 32 mmol/L — ABNORMAL HIGH (ref 20–31)
Calcium: 10 mg/dL (ref 8.6–10.4)
Creat: 0.77 mg/dL (ref 0.50–1.05)
GLUCOSE: 133 mg/dL — AB (ref 65–99)
POTASSIUM: 3.6 mmol/L (ref 3.5–5.3)
Sodium: 143 mmol/L (ref 135–146)

## 2015-11-28 NOTE — Assessment & Plan Note (Signed)
Patient presenting with 3 weeks of sinus congestion, rhinorrhea, and sinus pressure without associated symptoms. Concerns for bacterial infection given the duration of symptoms. Patient is self pay and would therefore like to try an antibiotic on the $4 list. Non-toxic on exam. Also noted to have a small scab in her right nostril that is tender, however is hemostatic and does not appear infected. - amoxicillin 500mg  TID x 7 days  - bactroban nasal ointment- to place a small amount over the bump twice daily for 7 days. If no improvement or worsening, patient to follow back up with [place].  - discussed using nasal saline and saline washes as well. - RTC precautions discussed.

## 2016-03-11 ENCOUNTER — Other Ambulatory Visit: Payer: Self-pay | Admitting: Family Medicine

## 2016-03-12 NOTE — Telephone Encounter (Signed)
Due for follow up on Diabetes.  Please have patient schedule w/ Dr Leonides Schanz.  1 month supply given.

## 2016-04-18 ENCOUNTER — Other Ambulatory Visit: Payer: Self-pay | Admitting: Family Medicine

## 2016-05-28 ENCOUNTER — Telehealth: Payer: Self-pay | Admitting: *Deleted

## 2016-05-28 NOTE — Telephone Encounter (Signed)
Pt states that she called to get a mammogram scheduled, but since she is having pain they told her she would need to call MD first to get a more precise order.    Per pt she is having pain in her left breast under her arm x 4 months now. Laurenashley Viar, Maryjo Rochester, CMA

## 2016-05-29 NOTE — Telephone Encounter (Signed)
LMOVM for pt to call us back and make an appt. Willet Schleifer, CMA  

## 2016-05-29 NOTE — Telephone Encounter (Signed)
Please have the patient come in to be evaluated, that way we know which orders to place for her.  Thanks, Joanna Puff, MD [facility] Family Medicine Resident

## 2016-05-30 ENCOUNTER — Telehealth: Payer: Self-pay | Admitting: Family Medicine

## 2016-05-30 DIAGNOSIS — Z1239 Encounter for other screening for malignant neoplasm of breast: Secondary | ICD-10-CM

## 2016-05-30 NOTE — Telephone Encounter (Signed)
Pt states she does not want to come in and that a mammogram was previously discussed with Dr. Leonides Schanz. Pt would like a referral sent to [facility]. Pt has an appointment there on Thursday 06-06-2016 @ 9:45am. Please advise. Thanks! ep

## 2016-06-01 ENCOUNTER — Other Ambulatory Visit: Payer: Self-pay | Admitting: Family Medicine

## 2016-06-03 ENCOUNTER — Telehealth (HOSPITAL_COMMUNITY): Payer: Self-pay | Admitting: *Deleted

## 2016-06-03 NOTE — Telephone Encounter (Signed)
Patient already has 1 order in. I placed another order in as "External." Please make sure this gets to [facility].  Thanks, Joanna Puff, MD

## 2016-06-03 NOTE — Telephone Encounter (Signed)
Telephoned patient at home # and phone rang and then went silent.

## 2016-06-04 ENCOUNTER — Other Ambulatory Visit: Payer: Self-pay | Admitting: Family Medicine

## 2016-06-05 ENCOUNTER — Telehealth (HOSPITAL_COMMUNITY): Payer: Self-pay | Admitting: *Deleted

## 2016-06-05 NOTE — Telephone Encounter (Signed)
Telephoned patient at home # and left message to return call to [facility] 

## 2016-06-06 ENCOUNTER — Ambulatory Visit (INDEPENDENT_AMBULATORY_CARE_PROVIDER_SITE_OTHER): Payer: Self-pay | Admitting: Family Medicine

## 2016-06-06 ENCOUNTER — Encounter: Payer: Self-pay | Admitting: Family Medicine

## 2016-06-06 VITALS — BP 152/70 | HR 76 | Temp 98.0°F | Ht 62.0 in | Wt 159.8 lb

## 2016-06-06 DIAGNOSIS — R05 Cough: Secondary | ICD-10-CM

## 2016-06-06 DIAGNOSIS — M25512 Pain in left shoulder: Secondary | ICD-10-CM

## 2016-06-06 DIAGNOSIS — R059 Cough, unspecified: Secondary | ICD-10-CM

## 2016-06-06 DIAGNOSIS — E119 Type 2 diabetes mellitus without complications: Secondary | ICD-10-CM

## 2016-06-06 DIAGNOSIS — I10 Essential (primary) hypertension: Secondary | ICD-10-CM

## 2016-06-06 LAB — POCT GLYCOSYLATED HEMOGLOBIN (HGB A1C): Hemoglobin A1C: 7.4

## 2016-06-06 MED ORDER — CARVEDILOL 6.25 MG PO TABS: ORAL_TABLET | ORAL | 3 refills | Status: DC

## 2016-06-06 MED ORDER — LOSARTAN POTASSIUM 50 MG PO TABS: 50.0000 mg | ORAL_TABLET | Freq: Every day | ORAL | 3 refills | Status: DC

## 2016-06-06 MED ORDER — RANITIDINE HCL 150 MG PO CAPS: 150.0000 mg | ORAL_CAPSULE | Freq: Two times a day (BID) | ORAL | 2 refills | Status: DC

## 2016-06-06 MED ORDER — METFORMIN HCL 1000 MG PO TABS: ORAL_TABLET | ORAL | 1 refills | Status: DC

## 2016-06-06 MED ORDER — GLIMEPIRIDE 4 MG PO TABS: ORAL_TABLET | ORAL | 6 refills | Status: DC

## 2016-06-06 MED ORDER — ATORVASTATIN CALCIUM 40 MG PO TABS: 40.0000 mg | ORAL_TABLET | Freq: Every day | ORAL | 3 refills | Status: DC

## 2016-06-06 MED ORDER — FUROSEMIDE 40 MG PO TABS: 40.0000 mg | ORAL_TABLET | Freq: Every day | ORAL | 6 refills | Status: DC
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
## 2016-06-06 NOTE — Assessment & Plan Note (Addendum)
  Has been painful for almost 1 year, no hx of trauma. Physical exam findings correlate most with adhesive capsulitis, although did have bicep tenderness as well  -no x-ray/imaging due to financial cost (no insurance) -will refer to sports med for further work up -will likely need PT

## 2016-06-06 NOTE — Assessment & Plan Note (Signed)
  Not at goal of <140/90, BP today was 152/70 on recheck but patient had been out of blood pressure medications for the past week and just started taking them again last night.  -will recheck BP at nurse visit in 1 week -medications refilled today -follow up with PCP in 3 months

## 2016-06-06 NOTE — Assessment & Plan Note (Signed)
  A1C today 7.4, patient doing well with taking medications. Does not keep track of blood sugars at home.  -follow up in 3 months w/ PCP -refilled medications today -needs eye exam/foot exam

## 2016-06-06 NOTE — Patient Instructions (Signed)
  Please come back in a week for a nursing visit to check blood pressure.  Follow up with Dr. Leonides Schanz in 3 months for diabetes/hypertension check up.  You will get a call from our referral department about Sports Medicine appointment.

## 2016-06-07 ENCOUNTER — Telehealth: Payer: Self-pay | Admitting: *Deleted

## 2016-06-07 NOTE — Telephone Encounter (Signed)
Faxing mammogram order to [facility]. Lamonte Sakai, April D, CMA d

## 2016-06-10 ENCOUNTER — Other Ambulatory Visit (HOSPITAL_COMMUNITY): Payer: Self-pay | Admitting: *Deleted

## 2016-06-10 DIAGNOSIS — N644 Mastodynia: Secondary | ICD-10-CM

## 2016-06-13 ENCOUNTER — Ambulatory Visit (INDEPENDENT_AMBULATORY_CARE_PROVIDER_SITE_OTHER): Payer: Self-pay | Admitting: Sports Medicine

## 2016-06-13 ENCOUNTER — Encounter: Payer: Self-pay | Admitting: Sports Medicine

## 2016-06-13 ENCOUNTER — Ambulatory Visit (INDEPENDENT_AMBULATORY_CARE_PROVIDER_SITE_OTHER): Payer: Self-pay | Admitting: *Deleted

## 2016-06-13 VITALS — BP 150/72 | HR 84

## 2016-06-13 VITALS — BP 128/55 | Ht 62.0 in | Wt 159.0 lb

## 2016-06-13 DIAGNOSIS — M7500 Adhesive capsulitis of unspecified shoulder: Secondary | ICD-10-CM | POA: Insufficient documentation

## 2016-06-13 DIAGNOSIS — M7502 Adhesive capsulitis of left shoulder: Secondary | ICD-10-CM

## 2016-06-13 DIAGNOSIS — Z0130 Encounter for examination of blood pressure without abnormal findings: Secondary | ICD-10-CM

## 2016-06-13 DIAGNOSIS — Z136 Encounter for screening for cardiovascular disorders: Secondary | ICD-10-CM

## 2016-06-13 DIAGNOSIS — E119 Type 2 diabetes mellitus without complications: Secondary | ICD-10-CM

## 2016-06-13 DIAGNOSIS — M25512 Pain in left shoulder: Secondary | ICD-10-CM

## 2016-06-13 DIAGNOSIS — I10 Essential (primary) hypertension: Secondary | ICD-10-CM

## 2016-06-13 MED ORDER — AMITRIPTYLINE HCL 25 MG PO TABS
25.0000 mg | ORAL_TABLET | Freq: Every day | ORAL | 1 refills | Status: DC
Start: 2016-06-13 — End: 2017-01-28

## 2016-06-13 NOTE — Progress Notes (Signed)
  Grace Ferrell - 50 y.o. female MRN [id number]  Date of birth: 1966-06-08  SUBJECTIVE:  Including CC & ROS.  CC: Left shoulder pain Presents with left shoulder pain that has been ongoing for 6-8 months. She does not recall a traumatic events. She noticed that she has found a decrease in her range of motion and significant pain of her shoulder. She located on the posterior and lateral aspects of her shoulder.  Does have some pain that occasionally radiates down her arm. She denies any neck pain.   ROS: No unexpected weight loss, fever, chills, swelling, instability, muscle pain, numbness/tingling, redness, otherwise see HPI   PMHx - Updated and reviewed.  Contributory factors include: Diabetes mellitus PSHx - Updated and reviewed.  Contributory factors include:  Negative FHx - Updated and reviewed.  Contributory factors include:  Negative Social Hx - Updated and reviewed. Contributory factors include: Negative Medications - reviewed   DATA REVIEWED: Previous A1c and office visits  PHYSICAL EXAM:  VS: BP:(!) 128/55  HR: bpm  TEMP: ( )  RESP:   HT:5\' 2"  (157.5 cm)   WT:159 lb (72.1 kg)  BMI:29.1 PHYSICAL EXAM: Gen: NAD, alert, cooperative with exam, well-appearing HEENT: clear conjunctiva,  CV:  no edema, capillary refill brisk, normal rate Resp: non-labored Skin: no rashes, normal turgor  Neuro: no gross deficits.  Psych:  alert and oriented  Shoulder: Inspection reveals no abnormalities, atrophy or asymmetry. Palpation reveals tenderness to palpation on the left trapezius muscle, no acromioclavicular joint tenderness, no bicipital groove tenderness. ROM of left side reveals flexion only to 70, abduction to 60, external rotation to 20, internal rotation to 20. Rotator cuff strength normal throughout. Speeds and Yergason's tests normal. No drop arm sign.   ASSESSMENT & PLAN:   Adhesive capsulitis Discussed treatment options with patient. She is limited due to her diabetes  and financial constraints.  Discussed medications and possible injection as well as therapy. Will treat with Elavil at night. She would like to hold off on the injection at this time. Gave exercises  she can do at home and discussed possible physical therapy in the future.  Follow-up in one month

## 2016-06-13 NOTE — Progress Notes (Signed)
   Patient in nurse clinic for blood pressure check.  Patient is currently smoking cigarettes.  Last one was about 2 hours prior to appointment.  Patient also reported that she is under a lot stress.  Advised patient that smoking cigarettes plays apart along with stress for her blood pressure to be elevated.  Patient denies chest pain, SOB, dizziness, n/v, but reported headache.  Per patient her headaches are sinus related.  Patient takes her blood pressure medications at night.  Medications make her sleepy.    Offered Flu vaccine, but decline.  Patient reported having left arm pain after getting flu shot.  Patient stated that she can not lift arm no higher than her shoulder.  Patient is going to physical therapy in near future.  Patient wanted to wait on the Flu vaccine until she know the cause of her arm pain.  Patient asked if she remember any swelling or redness after vaccine given 11/2015.  Patient could not remember.  However, patient reported that vaccine was given in left arm just a few inches above elbow, more like in the fatty part of the arm.    Clovis Pu, RN  Today's Vitals   06/13/16 0953 06/13/16 1004  BP: (!) 158/80 (!) 150/72  Pulse: 84   SpO2: 97%   PainSc: 0-No pain

## 2016-06-13 NOTE — Assessment & Plan Note (Signed)
Discussed treatment options with patient. She is limited due to her diabetes and financial constraints.  Discussed medications and possible injection as well as therapy. Will treat with Elavil at night. She would like to hold off on the injection at this time. Gave exercises  she can do at home and discussed possible physical therapy in the future.

## 2016-06-19 ENCOUNTER — Telehealth (HOSPITAL_COMMUNITY): Payer: Self-pay | Admitting: *Deleted

## 2016-06-19 NOTE — Telephone Encounter (Signed)
Telephoned patient at home number and left message about [facility] appointment Thursday August 31 9:15.

## 2016-06-20 ENCOUNTER — Ambulatory Visit
Admission: RE | Admit: 2016-06-20 | Discharge: 2016-06-20 | Disposition: A | Discharge status: Home / Self Care | Payer: Self-pay | Source: Ambulatory Visit | Attending: Obstetrics and Gynecology | Admitting: Obstetrics and Gynecology

## 2016-06-20 ENCOUNTER — Encounter (HOSPITAL_COMMUNITY): Payer: Self-pay

## 2016-06-20 ENCOUNTER — Ambulatory Visit (HOSPITAL_COMMUNITY)
Admission: RE | Admit: 2016-06-20 | Discharge: 2016-06-20 | Disposition: A | Discharge status: Home / Self Care | Payer: Self-pay | Source: Ambulatory Visit | Attending: Obstetrics and Gynecology | Admitting: Obstetrics and Gynecology

## 2016-06-20 VITALS — BP 110/70 | Temp 98.3°F | Ht 62.0 in | Wt 159.6 lb

## 2016-06-20 DIAGNOSIS — N644 Mastodynia: Secondary | ICD-10-CM

## 2016-06-20 DIAGNOSIS — Z1239 Encounter for other screening for malignant neoplasm of breast: Secondary | ICD-10-CM

## 2016-06-20 IMAGING — MG 2D DIGITAL DIAGNOSTIC BILATERAL MAMMOGRAM WITH CAD AND ADJUNCT T
8 of 15 series · 8 of 35 positions shown · non-contrast
Comparison: None

CLINICAL DATA: Pain in the medial left breast

EXAM:
2D DIGITAL DIAGNOSTIC BILATERAL MAMMOGRAM WITH CAD AND ADJUNCT TOMO
ULTRASOUND LEFT BREAST

[R CC synth-2D]
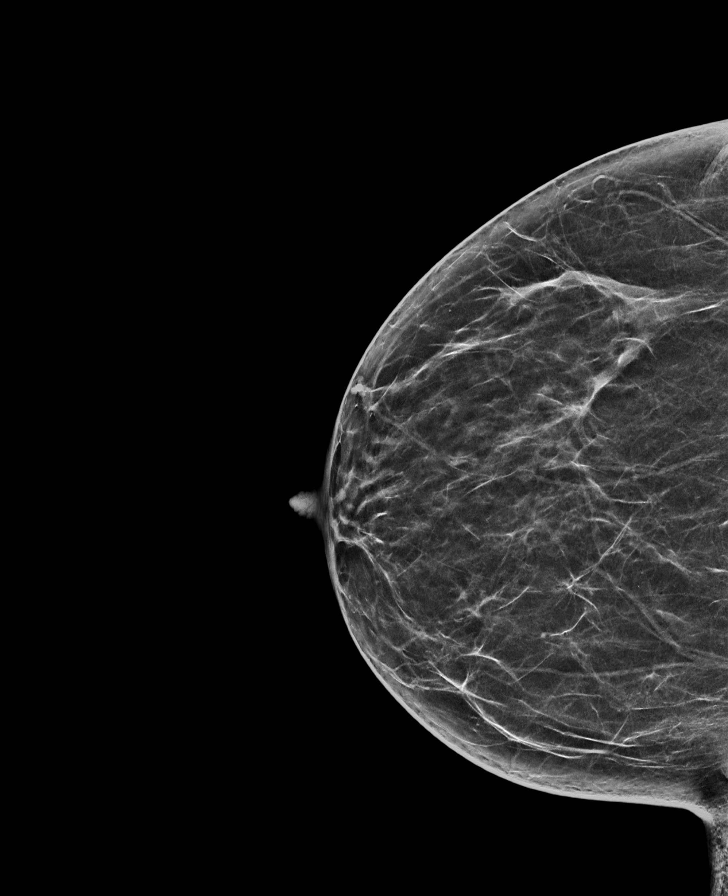

[L MLO synth-2D]
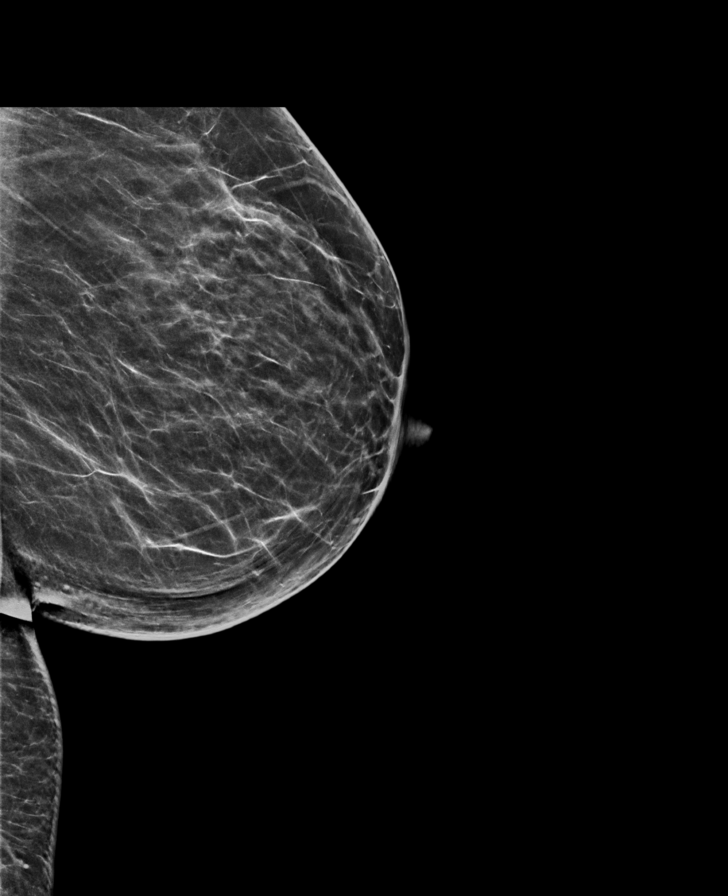

[L CC synth-2D]
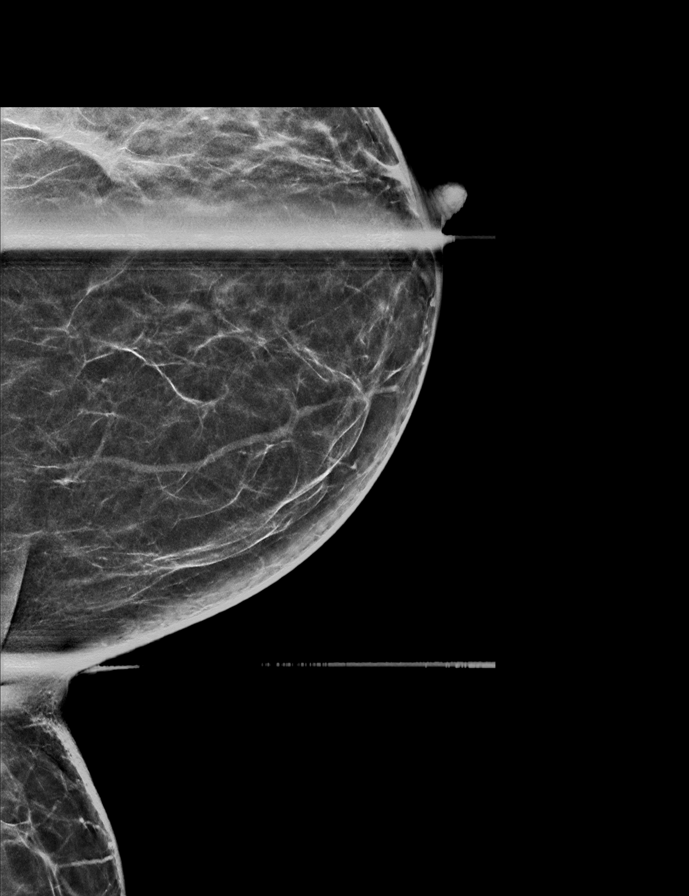

[R MLO]
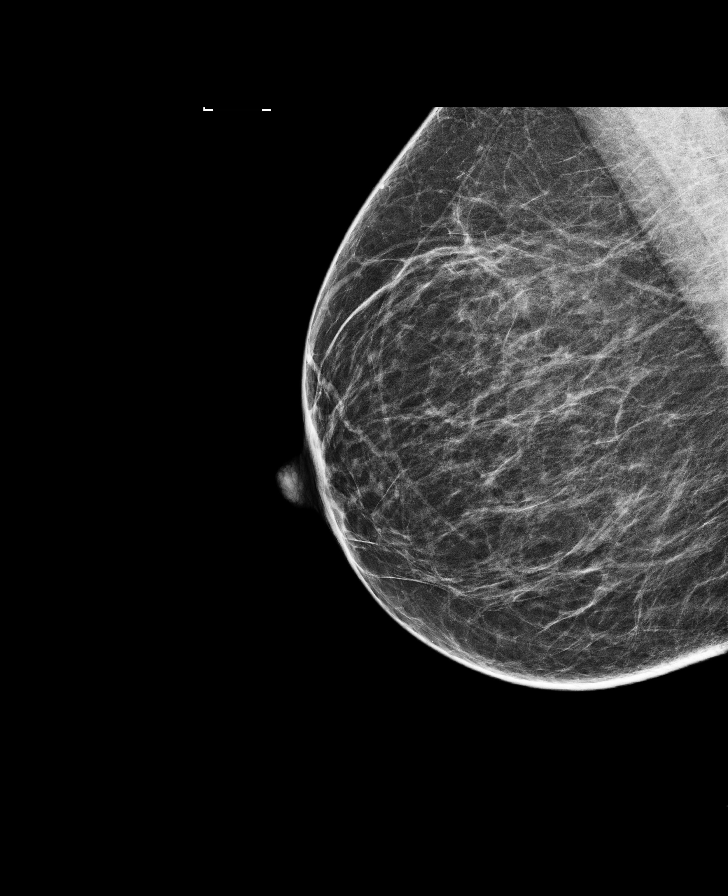

[L MLO]
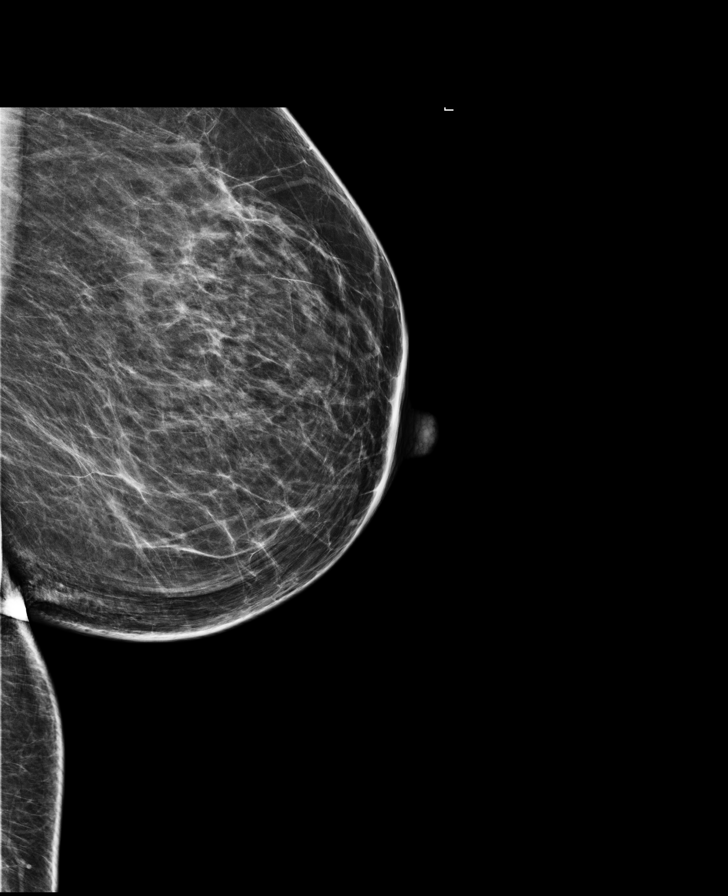

[L CC (1 of 2)]
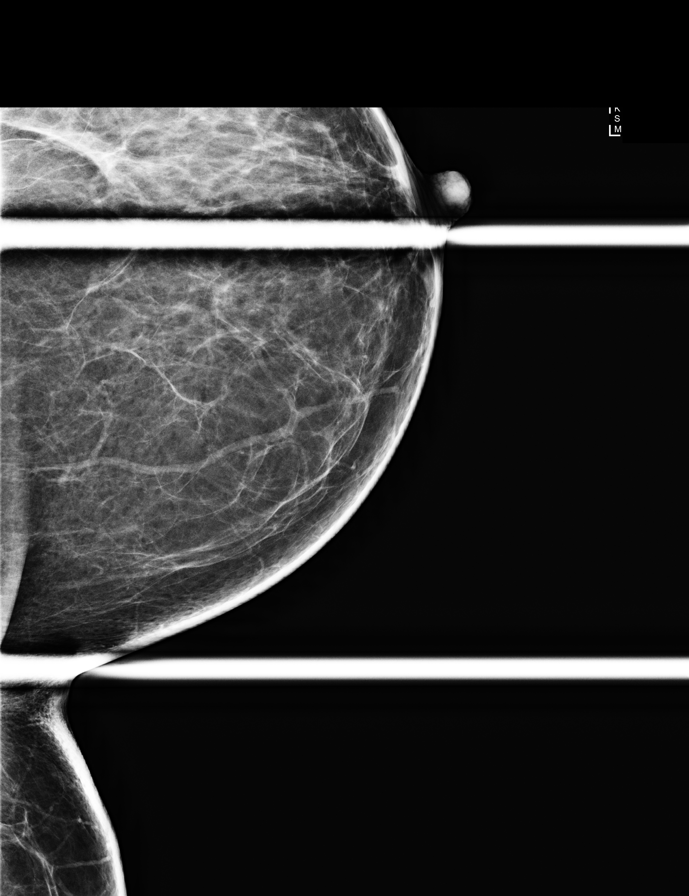

[L CC (2 of 2)]
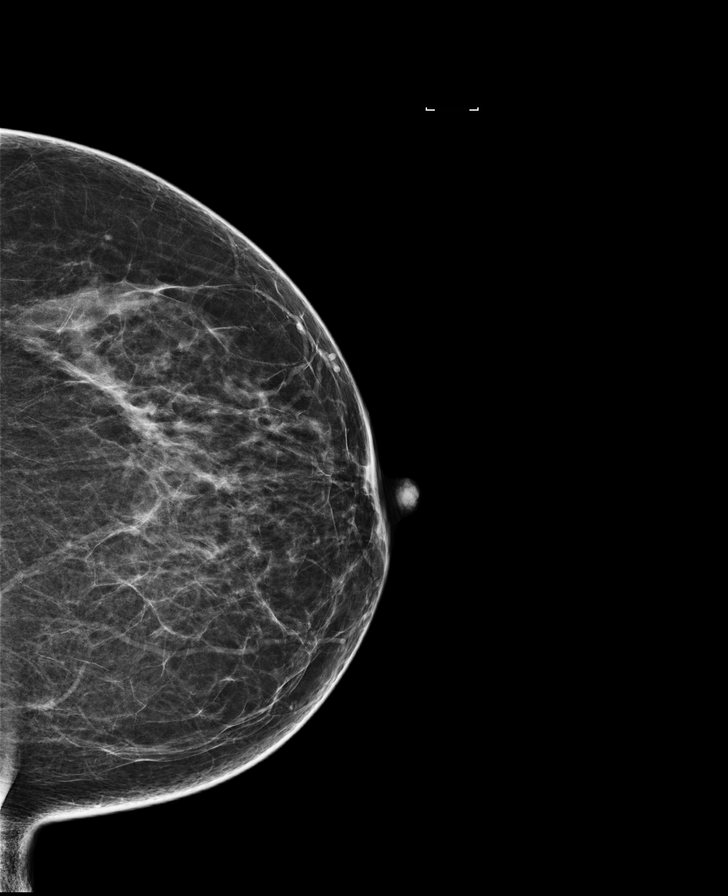

[R CC]
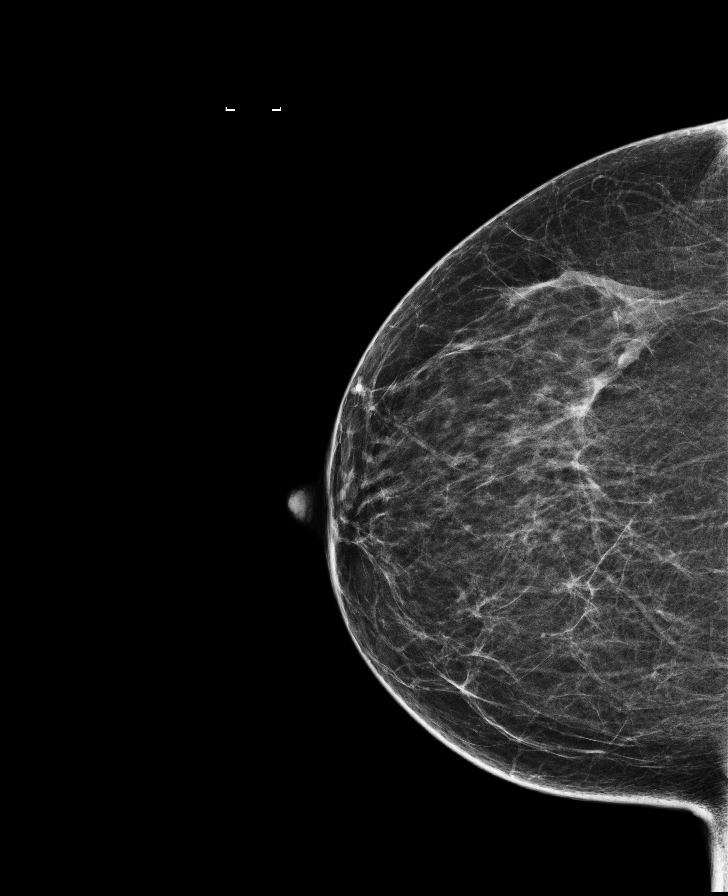

[8 of 35 positions shown; findings below may reference images not displayed]

ACR Breast Density Category b: There are scattered areas of
fibroglandular density.
FINDINGS: No suspicious masses, calcifications, or distortion.

Mammographic images were processed with CAD.

On physical exam, no suspicious lumps.

Targeted ultrasound is performed, showing no sonographic
abnormality.
IMPRESSION: No mammographic or sonographic evidence of malignancy.

RECOMMENDATION:
Annual screening mammography. Treatment of the patient's pain should
be based on clinical and physical exam given lack of imaging
findings.

I have discussed the findings and recommendations with the patient.
Results were also provided in writing at the conclusion of the
visit. If applicable, a reminder letter will be sent to the patient
regarding the next appointment.

BI-RADS CATEGORY  1: Negative.

## 2016-06-20 IMAGING — US ULTRASOUND LEFT BREAST LIMITED
1 series · 2 of 2 positions shown · non-contrast
Comparison: None

CLINICAL DATA: Pain in the medial left breast

EXAM:
2D DIGITAL DIAGNOSTIC BILATERAL MAMMOGRAM WITH CAD AND ADJUNCT TOMO
ULTRASOUND LEFT BREAST

[Series 1: ultrasound left breast limited · 0.07mm/px · 2 of 2 slices shown]
[im 1/2]
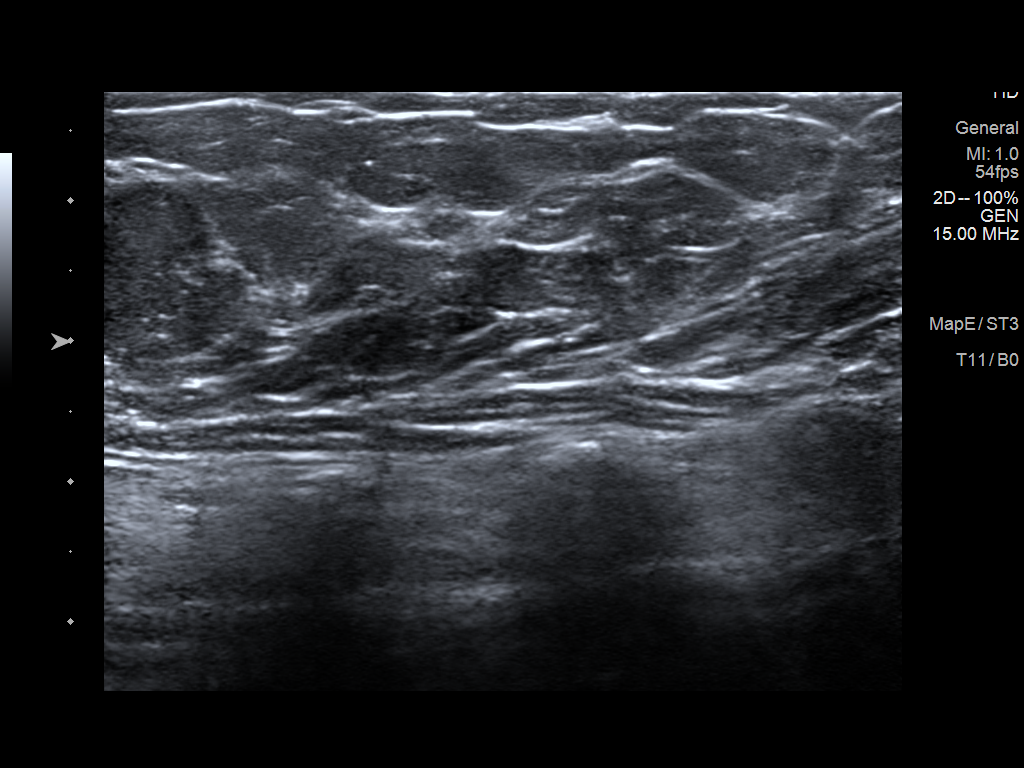
[im 2/2]
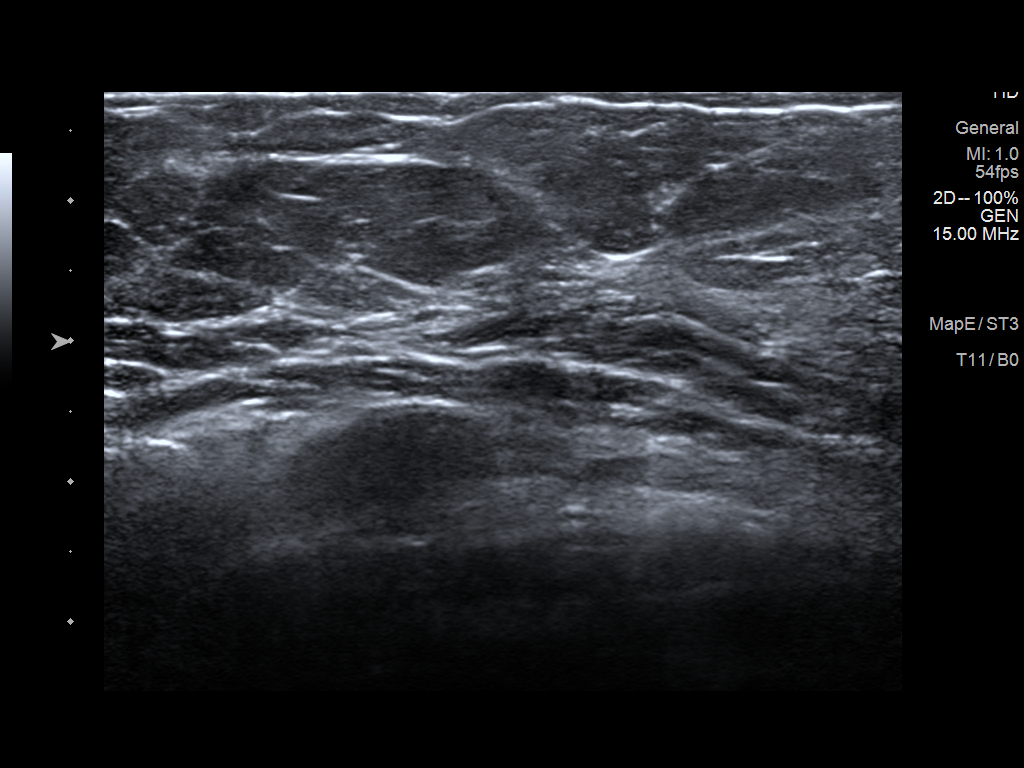

[2 of 2 positions shown; findings below may reference images not displayed]

ACR Breast Density Category b: There are scattered areas of
fibroglandular density.
FINDINGS: No suspicious masses, calcifications, or distortion.

Mammographic images were processed with CAD.

On physical exam, no suspicious lumps.

Targeted ultrasound is performed, showing no sonographic
abnormality.
IMPRESSION: No mammographic or sonographic evidence of malignancy.

RECOMMENDATION:
Annual screening mammography. Treatment of the patient's pain should
be based on clinical and physical exam given lack of imaging
findings.

I have discussed the findings and recommendations with the patient.
Results were also provided in writing at the conclusion of the
visit. If applicable, a reminder letter will be sent to the patient
regarding the next appointment.

BI-RADS CATEGORY  1: Negative.

## 2016-06-20 NOTE — Addendum Note (Signed)
Encounter addended by: Lynnell Dike, LPN on: 9/37/9024 12:48 PM<BR>    Actions taken: Order Entry activity accessed

## 2016-06-20 NOTE — Progress Notes (Signed)
Complaints of left inner breast pain x 8-9 months. Patient states it is a dull pain that comes and goes. Patient rates pain at a 6 out of 10. Patient thinks she may feel a lump within the left inner breast.  Pap Smear: Pap smear completed today. Last Pap smear was 04/10/2012 and normal. Per patient has a history of an abnormal Pap smear 20-30 years that she refused follow up. Per patient all Pap smears have been normal since and she has had more than three Pap smears. Last Pap smear result is in EPIC.  Physical exam: Breasts Breasts symmetrical. No skin abnormalities bilateral breasts. No nipple retraction bilateral breasts. No nipple discharge bilateral breasts. No lymphadenopathy. No lumps palpated bilateral breasts. Complaints of left inner breast tenderness that was greater at 9 o'clock on exam. Referred patient to the [facility] for a diagnostic mammogram. Appointment scheduled for Thursday, June 20, 2016 at 0915.  Pelvic/Bimanual   Ext Genitalia No lesions, no swelling and no discharge observed on external genitalia.         Vagina Vagina pink and normal texture. No lesions or discharge observed in vagina.          Cervix Cervix is present. Cervix pink and reddened around os. Cervix friable. Cervix normal texture. No discharge observed.     Uterus Uterus is present and palpable. Uterus in normal position and normal size.        Adnexae Bilateral ovaries present and palpable. No tenderness on palpation.          Rectovaginal No rectal exam completed today since patient had no rectal complaints. No skin abnormalities observed on exam.    Smoking History: Patient is a current smoker. Discussed smoking cessation with patient. Referred patient to the [facility] Quitline and gave resources to free smoking cessation classes offered at [facility].  Patient Navigation: Patient education provided. Access to services provided for patient through [facility] program.   Colorectal  Cancer Screening: Patient has never had a colonoscopy. No complaints today.

## 2016-06-20 NOTE — Patient Instructions (Addendum)
Explained breast self awareness to Grace Ferrell. Let her know [facility] will cover Pap smears and HPV typing every 5 years unless has a history of abnormal Pap smears. Referred patient to the [facility] for a diagnostic mammogram. Appointment scheduled for Thursday, June 20, 2016 at 0915. Let patient know will follow up with her within the next couple weeks with results of Pap smear by phone. Discussed smoking cessation with patient. Referred patient to the [facility] Quitline and gave resources to free smoking cessation classes offered at [facility]. Grace Ferrell verbalized understanding.  Challis Crill, Kathaleen Maser, RN 11:08 AM

## 2016-06-21 ENCOUNTER — Encounter (HOSPITAL_COMMUNITY): Payer: Self-pay | Admitting: *Deleted

## 2016-06-21 LAB — CYTOLOGY - PAP

## 2016-06-25 ENCOUNTER — Telehealth (HOSPITAL_COMMUNITY): Payer: Self-pay | Admitting: *Deleted

## 2016-06-25 NOTE — Telephone Encounter (Signed)
Telephoned patient at home number and left message to return call to [facility] 

## 2016-06-26 ENCOUNTER — Telehealth (HOSPITAL_COMMUNITY): Payer: Self-pay | Admitting: *Deleted

## 2016-06-26 NOTE — Telephone Encounter (Signed)
Patient returned call to [facility]. Advised patient of negative pap smear results and negative HPV. Next pap smear due in five years. Patient voiced understanding.

## 2016-07-12 ENCOUNTER — Other Ambulatory Visit: Payer: Self-pay | Admitting: Family Medicine

## 2016-07-15 ENCOUNTER — Ambulatory Visit: Payer: Self-pay | Admitting: Sports Medicine

## 2016-10-08 ENCOUNTER — Other Ambulatory Visit: Payer: Self-pay | Admitting: Family Medicine

## 2016-10-31 ENCOUNTER — Other Ambulatory Visit: Payer: Self-pay | Admitting: Family Medicine

## 2016-12-03 ENCOUNTER — Telehealth (HOSPITAL_COMMUNITY): Payer: Self-pay | Admitting: *Deleted

## 2016-12-03 NOTE — Telephone Encounter (Signed)
Telephoned patient at work number. Patient states received bill for mammogram back in August. Patient will fax copy of bill 12/04/2016.

## 2016-12-28 ENCOUNTER — Other Ambulatory Visit: Payer: Self-pay | Admitting: Family Medicine

## 2017-01-11 ENCOUNTER — Other Ambulatory Visit: Payer: Self-pay | Admitting: Family Medicine

## 2017-01-28 ENCOUNTER — Ambulatory Visit (INDEPENDENT_AMBULATORY_CARE_PROVIDER_SITE_OTHER): Payer: Self-pay | Admitting: Family Medicine

## 2017-01-28 VITALS — BP 116/76 | HR 77 | Temp 97.5°F | Wt 164.8 lb

## 2017-01-28 DIAGNOSIS — I5022 Chronic systolic (congestive) heart failure: Secondary | ICD-10-CM

## 2017-01-28 DIAGNOSIS — Z0000 Encounter for general adult medical examination without abnormal findings: Secondary | ICD-10-CM

## 2017-01-28 DIAGNOSIS — I10 Essential (primary) hypertension: Secondary | ICD-10-CM

## 2017-01-28 DIAGNOSIS — Z1159 Encounter for screening for other viral diseases: Secondary | ICD-10-CM

## 2017-01-28 DIAGNOSIS — E119 Type 2 diabetes mellitus without complications: Secondary | ICD-10-CM

## 2017-01-28 DIAGNOSIS — D649 Anemia, unspecified: Secondary | ICD-10-CM

## 2017-01-28 DIAGNOSIS — E785 Hyperlipidemia, unspecified: Secondary | ICD-10-CM

## 2017-01-28 LAB — POCT GLYCOSYLATED HEMOGLOBIN (HGB A1C): Hemoglobin A1C: 7.4

## 2017-01-28 NOTE — Assessment & Plan Note (Signed)
A1c  Stable at 7.4 today. Doesn't check CBGs at home.  - continue Amaryl and metformin - foot exam performed today, normal - needs eye exam - on ASA and an ARB - f/u in 3 months

## 2017-01-28 NOTE — Progress Notes (Signed)
51 y.o. year old female presents for well woman examination.  Acute Concerns:  HLD: on statin. Eating fried fish frequently. Also eating cheese and nuts more frequently. Trying to incorporate more vegetables. No regular exercise.   CHF/ HTN: on coreg. Losartan, Lasix. No side effects. No chest pain, SOB, blurred vision.   DM: on Amaryl and Metformin. No symptoms of hypoglycemia. Doesn't check blood sugars. No side effects from medications.   Birth Control: postmenopausal. Not currently sexually active   Review of Systems  Constitutional: Negative for chills, fever, malaise/fatigue and weight loss.  HENT: Negative for congestion, ear discharge, hearing loss and sinus pain.   Eyes: Negative for blurred vision, double vision and photophobia.  Respiratory: Negative for cough, sputum production and shortness of breath.   Cardiovascular: Negative for chest pain, orthopnea and leg swelling.  Gastrointestinal: Negative for abdominal pain, blood in stool, constipation, diarrhea, heartburn and melena.  Genitourinary: Negative for dysuria, frequency and urgency.  Musculoskeletal: Negative for back pain, falls, joint pain, myalgias and neck pain.  Skin: Negative for rash.  Neurological: Negative for dizziness, focal weakness, weakness and headaches.  Endo/Heme/Allergies: Negative.   Psychiatric/Behavioral: Negative for depression and substance abuse. The patient is not nervous/anxious and does not have insomnia.       Social:  Social History   Social History  . Marital status: Single    Spouse name: N/A  . Number of children: N/A  . Years of education: N/A   Social History Main Topics  . Smoking status: Current Every Day Smoker    Packs/day: 0.50    Years: 21.00    Types: Cigarettes  . Smokeless tobacco: Never Used  . Alcohol use No  . Drug use: No  . Sexual activity: Not Currently    Birth control/ protection: Surgical   Other Topics Concern  . Not on file   Social History  Narrative  . No narrative on file    Immunization: Immunization History  Administered Date(s) Administered  . Influenza Whole 08/17/2008  . Influenza,inj,Quad PF,36+ Mos 11/27/2015  . Influenza-Unspecified 09/05/2014  . Td 12/25/2005    Cancer Screening:  Pap Smear: 05/2016- negative for changes or HR HPV  Mammogram: negative (with use of [place]) from 05/2016   Colonoscopy:  OVERDUE    Physical Exam: VITALS: Reviewed Blood pressure 116/76, pulse 77, temperature 97.5 F (36.4 C), temperature source Oral, weight 164 lb 12.8 oz (74.8 kg), SpO2 99 %. GEN: Pleasant female, NAD HEENT: Normocephalic, PERRL, EOMI, no scleral icterus, bilateral TM pearly grey, nasal septum midline, MMM, uvula midline, no anterior or posterior lymphadenopathy, no thyromegaly CARDIAC:RRR, S1 and S2 present, I/VI diastolic murmur, no heaves/thrills RESP: CTAB, normal effort ABD: soft, no tenderness, normal bowel sounds EXT: No edema, 2+ radial and DP pulses. Not ulcerations noted on the feet. Normal monofilament and proprioception bilaterally. SKIN: no rash  A1c 7.4   ASSESSMENT & PLAN: 51 y.o. female presents for annual well woman exam. Please see problem specific assessment and plan.   Essential hypertension Well controlled. Continue current regimen. - will get BMET today.  Chronic systolic heart failure (HCC) Euvolemic on exam. Continues Coreg, lasix, and losartan. - BMET today.  DM2 (diabetes mellitus, type 2) A1c  Stable at 7.4 today. Doesn't check CBGs at home.  - continue Amaryl and metformin - foot exam performed today, normal - needs eye exam - on ASA and an ARB - f/u in 3 months   Health care maintenance Pap smear up to dates from  2017 with co-testing. Mammogram normal 05/2016, can go up to 2 years between screenings  Overdue for colonoscopy- pt does not have insurance or financial assistance. Will apply for financial assistance and will then get referral for colonoscopy. HIV and hep  C screening today. Discussed lifestyle changes such as change in diet and regular exercise. Discussed safety and depression as well.

## 2017-01-28 NOTE — Patient Instructions (Signed)
We will get lab work today. I will call you if your results are NOT normal, otherwise I will mail you the results.  Try to decrease the amount of fried foods you eat and try to get at least 150 minutes of exercise per week. - Eat a low-fat diet with lots of fruits and vegetables, up to 7-9 servings per day. - Seatbelts can save your life. Wear them always. - Smoke detectors on every level of your home, check batteries every year. - Eye Doctor - have an eye exam every 1-2 years - Alcohol If you drink, do it moderately,less than 2 drinks per day. - Health Care Power of Attorney.  Choose someone to speak for you if you are not able. - Depression is common in our stressful world.If you're feeling down or losing interest in things you normally enjoy, please come in for a visit.  Fill out the paper work for financial assistance and call Ree Shay for a meeting once you've completed the documentation and have all the documents you need. This will help [place] get you a colonoscopy.  Follow up with [place] in 3 months or sooner as needed.

## 2017-01-28 NOTE — Assessment & Plan Note (Signed)
Euvolemic on exam. Continues Coreg, lasix, and losartan. - BMET today.

## 2017-01-28 NOTE — Assessment & Plan Note (Signed)
Well controlled. Continue current regimen. - will get BMET today.

## 2017-01-28 NOTE — Assessment & Plan Note (Signed)
Pap smear up to dates from 2017 with co-testing. Mammogram normal 05/2016, can go up to 2 years between screenings  Overdue for colonoscopy- pt does not have insurance or financial assistance. Will apply for financial assistance and will then get referral for colonoscopy. HIV and hep C screening today. Discussed lifestyle changes such as change in diet and regular exercise. Discussed safety and depression as well.

## 2017-01-29 ENCOUNTER — Encounter: Payer: Self-pay | Admitting: Family Medicine

## 2017-01-29 LAB — CMP14+EGFR
A/G RATIO: 1.8 (ref 1.2–2.2)
ALK PHOS: 91 IU/L (ref 39–117)
ALT: 6 IU/L (ref 0–32)
AST: 11 IU/L (ref 0–40)
Albumin: 4.4 g/dL (ref 3.5–5.5)
BILIRUBIN TOTAL: 0.4 mg/dL (ref 0.0–1.2)
BUN / CREAT RATIO: 12 (ref 9–23)
BUN: 8 mg/dL (ref 6–24)
CHLORIDE: 99 mmol/L (ref 96–106)
CO2: 26 mmol/L (ref 18–29)
Calcium: 9.9 mg/dL (ref 8.7–10.2)
Creatinine, Ser: 0.68 mg/dL (ref 0.57–1.00)
GFR calc Af Amer: 115 mL/min/{1.73_m2} (ref >59)
GFR calc non Af Amer: 100 mL/min/{1.73_m2} (ref >59)
GLOBULIN, TOTAL: 2.5 g/dL (ref 1.5–4.5)
Glucose: 167 mg/dL — ABNORMAL HIGH (ref 65–99)
POTASSIUM: 3.8 mmol/L (ref 3.5–5.2)
SODIUM: 143 mmol/L (ref 134–144)
Total Protein: 6.9 g/dL (ref 6.0–8.5)

## 2017-01-29 LAB — LIPID PANEL
CHOLESTEROL TOTAL: 154 mg/dL (ref 100–199)
Chol/HDL Ratio: 4.1 ratio (ref 0.0–4.4)
HDL: 38 mg/dL — AB (ref >39)
LDL Calculated: 90 mg/dL (ref 0–99)
Triglycerides: 130 mg/dL (ref 0–149)
VLDL CHOLESTEROL CAL: 26 mg/dL (ref 5–40)

## 2017-01-29 LAB — CBC WITH DIFFERENTIAL/PLATELET
BASOS ABS: 0 10*3/uL (ref 0.0–0.2)
BASOS: 1 %
EOS (ABSOLUTE): 0.2 10*3/uL (ref 0.0–0.4)
Eos: 2 %
HEMOGLOBIN: 11.9 g/dL (ref 11.1–15.9)
Hematocrit: 35.6 % (ref 34.0–46.6)
IMMATURE GRANS (ABS): 0 10*3/uL (ref 0.0–0.1)
IMMATURE GRANULOCYTES: 0 %
LYMPHS: 48 %
Lymphocytes Absolute: 3 10*3/uL (ref 0.7–3.1)
MCH: 26.2 pg — ABNORMAL LOW (ref 26.6–33.0)
MCHC: 33.4 g/dL (ref 31.5–35.7)
MCV: 78 fL — AB (ref 79–97)
MONOCYTES: 6 %
Monocytes Absolute: 0.3 10*3/uL (ref 0.1–0.9)
NEUTROS PCT: 43 %
Neutrophils Absolute: 2.6 10*3/uL (ref 1.4–7.0)
Platelets: 290 10*3/uL (ref 150–379)
RBC: 4.54 x10E6/uL (ref 3.77–5.28)
RDW: 16.5 % — ABNORMAL HIGH (ref 12.3–15.4)
WBC: 6.2 10*3/uL (ref 3.4–10.8)

## 2017-01-29 LAB — HEPATITIS C ANTIBODY: Hep C Virus Ab: <0.1 s/co ratio (ref 0.0–0.9)

## 2017-01-29 LAB — HIV ANTIBODY (ROUTINE TESTING W REFLEX): HIV Screen 4th Generation wRfx: NONREACTIVE

## 2017-02-07 ENCOUNTER — Other Ambulatory Visit: Payer: Self-pay | Admitting: Family Medicine

## 2017-02-07 DIAGNOSIS — R05 Cough: Secondary | ICD-10-CM

## 2017-02-07 DIAGNOSIS — R059 Cough, unspecified: Secondary | ICD-10-CM

## 2017-02-07 MED ORDER — LOSARTAN POTASSIUM 50 MG PO TABS: 50.0000 mg | ORAL_TABLET | Freq: Every day | ORAL | 11 refills | Status: DC

## 2017-02-07 MED ORDER — CARVEDILOL 6.25 MG PO TABS: 6.2500 mg | ORAL_TABLET | Freq: Two times a day (BID) | ORAL | 1 refills | Status: DC

## 2017-02-07 MED ORDER — ASPIRIN 81 MG PO TBEC: 81.0000 mg | DELAYED_RELEASE_TABLET | Freq: Every day | ORAL | 3 refills | Status: DC

## 2017-02-07 MED ORDER — FUROSEMIDE 40 MG PO TABS: 40.0000 mg | ORAL_TABLET | Freq: Every day | ORAL | 6 refills | Status: DC

## 2017-02-07 MED ORDER — RANITIDINE HCL 150 MG PO CAPS: 150.0000 mg | ORAL_CAPSULE | Freq: Two times a day (BID) | ORAL | 2 refills | Status: DC

## 2017-02-07 MED ORDER — GLIMEPIRIDE 4 MG PO TABS: ORAL_TABLET | ORAL | 6 refills | Status: DC

## 2017-02-07 MED ORDER — METFORMIN HCL 1000 MG PO TABS: 1000.0000 mg | ORAL_TABLET | Freq: Two times a day (BID) | ORAL | 1 refills | Status: DC

## 2017-02-07 MED ORDER — ATORVASTATIN CALCIUM 40 MG PO TABS: 40.0000 mg | ORAL_TABLET | Freq: Every day | ORAL | 11 refills | Status: DC

## 2017-02-07 NOTE — Telephone Encounter (Signed)
Pt needs refills on aspirin, atorvastatin, carvedilol, furosemide, glimepiride, losartan, metformin, and ranitidine. Pt is out off all meds. Pt uses [facility] on [address]. ep

## 2017-05-16 ENCOUNTER — Other Ambulatory Visit: Payer: Self-pay | Admitting: *Deleted

## 2017-05-16 MED ORDER — CARVEDILOL 6.25 MG PO TABS: 6.2500 mg | ORAL_TABLET | Freq: Two times a day (BID) | ORAL | 1 refills | Status: DC

## 2017-08-09 ENCOUNTER — Other Ambulatory Visit: Payer: Self-pay | Admitting: Internal Medicine

## 2017-09-05 ENCOUNTER — Ambulatory Visit (INDEPENDENT_AMBULATORY_CARE_PROVIDER_SITE_OTHER): Payer: Self-pay | Admitting: Internal Medicine

## 2017-09-05 VITALS — BP 135/80 | HR 70 | Temp 98.5°F | Ht 62.0 in | Wt 168.8 lb

## 2017-09-05 DIAGNOSIS — E119 Type 2 diabetes mellitus without complications: Secondary | ICD-10-CM

## 2017-09-05 DIAGNOSIS — I10 Essential (primary) hypertension: Secondary | ICD-10-CM

## 2017-09-05 DIAGNOSIS — Z0000 Encounter for general adult medical examination without abnormal findings: Secondary | ICD-10-CM

## 2017-09-05 DIAGNOSIS — F17200 Nicotine dependence, unspecified, uncomplicated: Secondary | ICD-10-CM

## 2017-09-05 DIAGNOSIS — I5022 Chronic systolic (congestive) heart failure: Secondary | ICD-10-CM

## 2017-09-05 LAB — POCT GLYCOSYLATED HEMOGLOBIN (HGB A1C): HEMOGLOBIN A1C: 8.2

## 2017-09-05 MED ORDER — VALSARTAN 40 MG PO TABS: 40.0000 mg | ORAL_TABLET | Freq: Every day | ORAL | 0 refills | Status: DC

## 2017-09-05 NOTE — Assessment & Plan Note (Signed)
Patient is a current smoker of half a pack a day She is not interested in discussing quitting at this point though she thinks about it

## 2017-09-05 NOTE — Assessment & Plan Note (Signed)
Euvolemic on exam, previous echo in 2016 was within normal limits Has been taking Lasix, Coreg, and losartan  will switch to valsartan

## 2017-09-05 NOTE — Progress Notes (Signed)
   [facility] Kerrin Mo, MD Phone: [phone]  Reason For Visit:   CHRONIC DM, Type 2: Reports no concerns  Meds:Metformin and Amaryl - has been on them for 6-7 years  Reports good compliance. Tolerating well w/o side-effects Currently on Losartan -patient wants to stop this medication because there is been to recalls on it and she does not trust the company Lifestyle: Exercise walking a lot, water - staying from sodas and juice  Any hypoglycemia episodes: Denies palpations, diaphoresis, fatigue, weakness, jittery Denies polyuria, visual changes, numbness or tingling.  CHF  - No SOB, no orthopnea, no PND, no swelling in her legs  -Patient had a history of ejection fraction of 30-45% in 2015 when she was hospitalized, she now has a improving ejection fraction of 55-60% -She takes Coreg Lasix and losartan  - however she wants to stop the losartan for reasons above -She has never followed up with a cardiologist  Current smoker -She smokes less than half a pack a day - 30 years;  -use to smoke 2 packs a day  -She has thought about quitting but is not interested in seeing anybody about quitting   Past Medical History Reviewed problem list.  Medications- reviewed and updated No additions to family history Social history- patient is a non- smoker  Objective: BP 135/80 (BP Location: Left Arm, Patient Position: Sitting, Cuff Size: Normal)   Pulse 70   Temp 98.5 F (36.9 C) (Oral)   Ht '5\' 2"'$  (1.575 m)   Wt 168 lb 12.8 oz (76.6 kg)   SpO2 99%   BMI 30.87 kg/m  Gen: NAD, alert, cooperative with exam Cardio: regular rate and rhythm, S1S2 heard, no murmurs appreciated Pulm: clear to auscultation bilaterally, no wheezes, rhonchi or rales Extremities: warm, well perfused, No edema, cyanosis or clubbing;  Skin: dry, intact, no rashes or lesions   Assessment/Plan: See problem based a/p  DM2 (diabetes mellitus, type 2) Patient is A1c is up slightly  from 7.4 to 8.2.  Given her lack of insurance she has limited access to medications for diabetes -Continue metformin  -Will consider increasing Amaryl from 4 mg to 8 mg - will need to call patient to discuss this -Patient says she had a eye exam last year, she states that eye exams cost her a significant amount of money and she cannot get them every year-she states that her eye exam last year was within normal limits  Essential hypertension Blood pressures well controlled Lasix Coreg and losartan However patient would like to change from losartan to valsartan We will obtain a be met today  Health care maintenance -Patient is not interested in colonoscopy due to financial reasons she does not want to get testing of her stool done as a secondary option -She is not interested in the flu shot or the pneumonia shot I have discussed her increased risk being higher given her history of heart disease and diabetes if she were to get these illnesses.  States she got the flu shot last year and had pain in her arm from it for several months.  TOBACCO USER Patient is a current smoker of half a pack a day She is not interested in discussing quitting at this point though she thinks about it  Chronic systolic heart failure ([place]) Euvolemic on exam, previous echo in 2016 was within normal limits Has been taking Lasix, Coreg, and losartan  will switch to valsartan

## 2017-09-05 NOTE — Assessment & Plan Note (Signed)
Blood pressures well controlled Lasix Coreg and losartan However patient would like to change from losartan to valsartan We will obtain a be met today

## 2017-09-05 NOTE — Patient Instructions (Addendum)
It was nice meeting you today.  We will try the valsartan and let me know if it is costing too much.  We can also discuss may be what changes you could do based on different pharmacies.  Please follow-up in about 3 months

## 2017-09-05 NOTE — Assessment & Plan Note (Addendum)
Patient is A1c is up slightly from 7.4 to 8.2.  Given her lack of insurance she has limited access to medications for diabetes -Continue metformin  -Will consider increasing Amaryl from 4 mg to 8 mg - will need to call patient to discuss this -Patient says she had a eye exam last year, she states that eye exams cost her a significant amount of money and she cannot get them every year-she states that her eye exam last year was within normal limits

## 2017-09-05 NOTE — Assessment & Plan Note (Signed)
-  Patient is not interested in colonoscopy due to financial reasons she does not want to get testing of her stool done as a secondary option -She is not interested in the flu shot or the pneumonia shot I have discussed her increased risk being higher given her history of heart disease and diabetes if she were to get these illnesses.  States she got the flu shot last year and had pain in her arm from it for several months.

## 2017-09-06 LAB — BASIC METABOLIC PANEL
BUN / CREAT RATIO: 12 (ref 9–23)
BUN: 10 mg/dL (ref 6–24)
CALCIUM: 10.1 mg/dL (ref 8.7–10.2)
CHLORIDE: 101 mmol/L (ref 96–106)
CO2: 26 mmol/L (ref 20–29)
CREATININE: 0.84 mg/dL (ref 0.57–1.00)
GFR calc non Af Amer: 80 mL/min/{1.73_m2} (ref >59)
GFR, EST AFRICAN AMERICAN: 92 mL/min/{1.73_m2} (ref >59)
GLUCOSE: 163 mg/dL — AB (ref 65–99)
Potassium: 4.5 mmol/L (ref 3.5–5.2)
Sodium: 142 mmol/L (ref 134–144)

## 2017-09-08 ENCOUNTER — Encounter: Payer: Self-pay | Admitting: Internal Medicine

## 2017-09-26 ENCOUNTER — Other Ambulatory Visit: Payer: Self-pay | Admitting: *Deleted

## 2017-09-30 MED ORDER — GLIMEPIRIDE 4 MG PO TABS: ORAL_TABLET | ORAL | 6 refills | Status: DC

## 2017-09-30 MED ORDER — FUROSEMIDE 40 MG PO TABS: 40.0000 mg | ORAL_TABLET | Freq: Every day | ORAL | 6 refills | Status: DC

## 2017-09-30 NOTE — Telephone Encounter (Signed)
Discussed continuing glimepiride at current dose. Plans to lose weight, will discuss A1C again at next visit

## 2017-10-31 ENCOUNTER — Other Ambulatory Visit: Payer: Self-pay | Admitting: Internal Medicine

## 2017-12-18 ENCOUNTER — Other Ambulatory Visit: Payer: Self-pay

## 2017-12-18 ENCOUNTER — Other Ambulatory Visit: Payer: Self-pay | Admitting: *Deleted

## 2017-12-18 ENCOUNTER — Ambulatory Visit (INDEPENDENT_AMBULATORY_CARE_PROVIDER_SITE_OTHER): Payer: BC Managed Care – PPO | Admitting: Internal Medicine

## 2017-12-18 VITALS — BP 140/80 | HR 64 | Temp 98.0°F | Ht 62.0 in | Wt 165.4 lb

## 2017-12-18 DIAGNOSIS — J322 Chronic ethmoidal sinusitis: Secondary | ICD-10-CM | POA: Diagnosis not present

## 2017-12-18 DIAGNOSIS — I10 Essential (primary) hypertension: Secondary | ICD-10-CM | POA: Diagnosis not present

## 2017-12-18 DIAGNOSIS — E119 Type 2 diabetes mellitus without complications: Secondary | ICD-10-CM

## 2017-12-18 DIAGNOSIS — Z0000 Encounter for general adult medical examination without abnormal findings: Secondary | ICD-10-CM

## 2017-12-18 LAB — POCT GLYCOSYLATED HEMOGLOBIN (HGB A1C): HEMOGLOBIN A1C: 8

## 2017-12-18 MED ORDER — MOMETASONE FUROATE 50 MCG/ACT NA SUSP: 2.0000 | Freq: Every day | NASAL | 12 refills | Status: DC

## 2017-12-18 MED ORDER — VALSARTAN 40 MG PO TABS: 40.0000 mg | ORAL_TABLET | Freq: Every day | ORAL | 4 refills | Status: DC

## 2017-12-18 MED ORDER — ASPIRIN 81 MG PO TBEC: 81.0000 mg | DELAYED_RELEASE_TABLET | Freq: Every day | ORAL | 3 refills | Status: DC

## 2017-12-18 MED ORDER — CARVEDILOL 6.25 MG PO TABS: ORAL_TABLET | ORAL | 1 refills | Status: DC

## 2017-12-18 MED ORDER — FUROSEMIDE 40 MG PO TABS: 40.0000 mg | ORAL_TABLET | Freq: Every day | ORAL | 6 refills | Status: DC

## 2017-12-18 MED ORDER — METFORMIN HCL 1000 MG PO TABS: 1000.0000 mg | ORAL_TABLET | Freq: Two times a day (BID) | ORAL | 1 refills | Status: DC

## 2017-12-18 MED ORDER — ATORVASTATIN CALCIUM 40 MG PO TABS: 40.0000 mg | ORAL_TABLET | Freq: Every day | ORAL | 11 refills | Status: DC

## 2017-12-18 MED ORDER — CETIRIZINE HCL 10 MG PO TABS: 10.0000 mg | ORAL_TABLET | Freq: Every day | ORAL | 5 refills | Status: DC

## 2017-12-18 MED ORDER — SITAGLIPTIN PHOSPHATE 100 MG PO TABS: 100.0000 mg | ORAL_TABLET | Freq: Every day | ORAL | 2 refills | Status: DC

## 2017-12-18 NOTE — Progress Notes (Signed)
   [facility] Grace Chars, MD Phone: [phone]  Reason For Visit: Follow up   # CHRONIC DM, Type 2: Reports  Meds: Metformin 1000 mg BID, Glimepiride  4mg   Reports good compliance. Tolerating well w/o side-effects Currently on  ARB  Lifestyle: Has been stress eating due to work recently, realizes she needs to cut back  Any hypoglycemia episodes: Denies palpations, diaphoresis, fatigue, weakness, jittery Denies polyuria, visual changes, numbness or tingling.  # Sinus   Has been having sinus congestion in ethmoid sinus. Patient has been having sinus issues for a long time. Patient has been taking tylenol sinus. Has used saline solution. Has significant sore throat  Nasal discharge: nasal discharge  Medications tried:   Symptoms Fever: None  Headache or face pain: yes  Watery/Itchy eyes:  Sneezing: yes Scratchy throat:  Sore throat or swollen glands: yes    Past Medical History Reviewed problem list.  Medications- reviewed and updated No additions to family history Social history- patient is a non-smoker  Objective: BP 140/80 (BP Location: Right Arm, Patient Position: Sitting, Cuff Size: Normal)   Pulse 64   Temp 98 F (36.7 C) (Oral)   Ht 5\' 2"  (1.575 m)   Wt 165 lb 6.4 oz (75 kg)   SpO2 99%   BMI 30.25 kg/m  Gen: NAD, alert, cooperative with exam Cardio: regular rate and rhythm, S1S2 heard, no murmurs appreciated Pulm: clear to auscultation bilaterally, no wheezes, rhonchi or rales Skin: dry, intact, no rashes or lesions  Assessment/Plan: See problem based a/p  DM2 (diabetes mellitus, type 2) A1C 8.0  Stop glimpride and start patient on Januvia as she now has insurance Continue metformin  Patient to work on stopping stress eating  Eye exam referral placed   Chronic sinusitis Provided patient with Nasonex and Zyrtec Follow up in 1 month  Healthcare maintenance Refused colonoscopy, agreed to do FOBT screening

## 2017-12-18 NOTE — Progress Notes (Signed)
A1c

## 2017-12-18 NOTE — Patient Instructions (Signed)
Please stop glimbiride.  Please start Januvia daily.  Want to to try Zyrtec and Nasonex to help with your sinus.  Follow-up with me in 1 month.

## 2017-12-22 MED ORDER — VALSARTAN 40 MG PO TABS: 40.0000 mg | ORAL_TABLET | Freq: Every day | ORAL | 4 refills | Status: DC

## 2017-12-23 NOTE — Assessment & Plan Note (Signed)
A1C 8.0  Stop glimpride and start patient on Januvia as she now has insurance Continue metformin  Patient to work on stopping stress eating  Eye exam referral placed

## 2017-12-23 NOTE — Assessment & Plan Note (Signed)
Refused colonoscopy, agreed to do FOBT screening

## 2017-12-23 NOTE — Assessment & Plan Note (Signed)
Provided patient with Nasonex and Zyrtec Follow up in 1 month

## 2017-12-25 ENCOUNTER — Ambulatory Visit: Payer: BC Managed Care – PPO

## 2017-12-25 ENCOUNTER — Other Ambulatory Visit: Payer: Self-pay

## 2017-12-25 VITALS — BP 140/82 | HR 64

## 2017-12-25 DIAGNOSIS — I10 Essential (primary) hypertension: Secondary | ICD-10-CM

## 2017-12-25 DIAGNOSIS — E119 Type 2 diabetes mellitus without complications: Secondary | ICD-10-CM

## 2017-12-25 MED ORDER — SITAGLIPTIN PHOSPHATE 100 MG PO TABS: 100.0000 mg | ORAL_TABLET | Freq: Every day | ORAL | 2 refills | Status: DC

## 2017-12-25 MED ORDER — VALSARTAN 40 MG PO TABS: 40.0000 mg | ORAL_TABLET | Freq: Every day | ORAL | 4 refills | Status: DC

## 2017-12-25 NOTE — Telephone Encounter (Signed)
Pt came in for BP check today. Requesting refills on valsartan and januvia sent to [facility]. Shawna Orleans, RN

## 2017-12-25 NOTE — Progress Notes (Signed)
Patient here today for BP check.  BP today is 140/82 Checked BP in Left arm with regular cuff.  Pt denies symptoms.  Patient last took BP med last night.  Routed note to PCP.   Shawna Orleans, RN

## 2017-12-30 LAB — FECAL OCCULT BLOOD, IMMUNOCHEMICAL: FECAL OCCULT BLD: NEGATIVE

## 2018-01-29 ENCOUNTER — Telehealth: Payer: Self-pay

## 2018-01-29 NOTE — Telephone Encounter (Signed)
Pt left message on nurse line requesting a call back from nurse. Attempted to call pt back, no answer. LM to call back. Shawna Orleans, RN

## 2018-03-05 ENCOUNTER — Other Ambulatory Visit: Payer: Self-pay | Admitting: Internal Medicine

## 2018-04-09 ENCOUNTER — Other Ambulatory Visit: Payer: Self-pay | Admitting: Internal Medicine

## 2018-05-30 ENCOUNTER — Other Ambulatory Visit: Payer: Self-pay | Admitting: Internal Medicine

## 2018-06-24 ENCOUNTER — Other Ambulatory Visit: Payer: Self-pay | Admitting: Internal Medicine

## 2018-07-09 ENCOUNTER — Other Ambulatory Visit: Payer: Self-pay | Admitting: Internal Medicine

## 2018-08-17 ENCOUNTER — Other Ambulatory Visit: Payer: Self-pay | Admitting: Family Medicine

## 2018-08-26 ENCOUNTER — Other Ambulatory Visit: Payer: Self-pay

## 2018-08-26 NOTE — Telephone Encounter (Signed)
Fax from pharmacy for refill Glimepiride. Not on current med list.   Ples Specter, RN [facility] RN)

## 2018-08-28 ENCOUNTER — Telehealth: Payer: Self-pay

## 2018-08-28 ENCOUNTER — Other Ambulatory Visit: Payer: Self-pay | Admitting: Family Medicine

## 2018-08-28 MED ORDER — GLIMEPIRIDE 4 MG PO TABS: 4.0000 mg | ORAL_TABLET | Freq: Every day | ORAL | 0 refills | Status: DC

## 2018-08-28 NOTE — Progress Notes (Signed)
Sending in one month of glimepiride until patient returns to clinic to have her routine DM check up

## 2018-08-28 NOTE — Telephone Encounter (Signed)
Called patient and was told that the current medication to manage her blood sugar is too expensive. That is the reason why she wants to go back on the glimepiride which was previously discontinued.   Glennie Hawk, CMA\

## 2018-09-03 ENCOUNTER — Other Ambulatory Visit: Payer: Self-pay | Admitting: Family Medicine

## 2018-09-03 ENCOUNTER — Telehealth: Payer: Self-pay | Admitting: Family Medicine

## 2018-09-03 DIAGNOSIS — R05 Cough: Secondary | ICD-10-CM

## 2018-09-03 DIAGNOSIS — J322 Chronic ethmoidal sinusitis: Secondary | ICD-10-CM

## 2018-09-03 DIAGNOSIS — R059 Cough, unspecified: Secondary | ICD-10-CM

## 2018-09-03 DIAGNOSIS — E119 Type 2 diabetes mellitus without complications: Secondary | ICD-10-CM

## 2018-09-03 MED ORDER — RANITIDINE HCL 150 MG PO CAPS: 150.0000 mg | ORAL_CAPSULE | Freq: Two times a day (BID) | ORAL | 2 refills | Status: DC

## 2018-09-03 MED ORDER — CETIRIZINE HCL 10 MG PO TABS: 10.0000 mg | ORAL_TABLET | Freq: Every day | ORAL | 5 refills | Status: DC

## 2018-09-03 MED ORDER — METFORMIN HCL 1000 MG PO TABS: ORAL_TABLET | ORAL | 0 refills | Status: DC

## 2018-09-03 MED ORDER — CARVEDILOL 6.25 MG PO TABS: ORAL_TABLET | ORAL | 1 refills | Status: DC

## 2018-09-03 MED ORDER — ATORVASTATIN CALCIUM 40 MG PO TABS: 40.0000 mg | ORAL_TABLET | Freq: Every day | ORAL | 11 refills | Status: DC

## 2018-09-03 MED ORDER — SITAGLIPTIN PHOSPHATE 100 MG PO TABS: 100.0000 mg | ORAL_TABLET | Freq: Every day | ORAL | 2 refills | Status: DC

## 2018-09-03 MED ORDER — FUROSEMIDE 40 MG PO TABS: 40.0000 mg | ORAL_TABLET | Freq: Every day | ORAL | 5 refills | Status: DC

## 2018-09-03 MED ORDER — ASPIRIN 81 MG PO TBEC: 81.0000 mg | DELAYED_RELEASE_TABLET | Freq: Every day | ORAL | 3 refills | Status: DC

## 2018-09-03 MED ORDER — VALSARTAN 40 MG PO TABS: 40.0000 mg | ORAL_TABLET | Freq: Every day | ORAL | 3 refills | Status: DC

## 2018-09-03 MED ORDER — GLIMEPIRIDE 4 MG PO TABS: 4.0000 mg | ORAL_TABLET | Freq: Every day | ORAL | 0 refills | Status: DC

## 2018-09-03 MED ORDER — MOMETASONE FUROATE 50 MCG/ACT NA SUSP: 2.0000 | Freq: Every day | NASAL | 12 refills | Status: DC

## 2018-09-03 NOTE — Progress Notes (Signed)
All medications refilled and sent to [facility] pharmacy on [address].

## 2018-09-03 NOTE — Telephone Encounter (Signed)
Pt called to schedule her next check up with Dr. Karen Chafe which is 10/01/18. She would like to have Dr. Karen Chafe refill all of her medications to last her until that appointment.

## 2018-09-04 NOTE — Telephone Encounter (Signed)
Called and informed patient of RX's being refilled and reminder of upcoming appointment.  Glennie Hawk, CMA

## 2018-09-13 ENCOUNTER — Other Ambulatory Visit: Payer: Self-pay | Admitting: Internal Medicine

## 2018-10-01 ENCOUNTER — Ambulatory Visit (INDEPENDENT_AMBULATORY_CARE_PROVIDER_SITE_OTHER): Payer: Self-pay | Admitting: Family Medicine

## 2018-10-01 ENCOUNTER — Other Ambulatory Visit: Payer: Self-pay

## 2018-10-01 ENCOUNTER — Encounter: Payer: Self-pay | Admitting: Family Medicine

## 2018-10-01 VITALS — BP 144/68 | HR 79 | Temp 97.7°F | Ht 62.0 in | Wt 164.0 lb

## 2018-10-01 DIAGNOSIS — E119 Type 2 diabetes mellitus without complications: Secondary | ICD-10-CM

## 2018-10-01 DIAGNOSIS — I10 Essential (primary) hypertension: Secondary | ICD-10-CM

## 2018-10-01 LAB — POCT GLYCOSYLATED HEMOGLOBIN (HGB A1C): HbA1c, POC (controlled diabetic range): 9.3 % — AB (ref 0.0–7.0)

## 2018-10-01 MED ORDER — GLIMEPIRIDE 2 MG PO TABS: 2.0000 mg | ORAL_TABLET | Freq: Every day | ORAL | 3 refills | Status: DC

## 2018-10-01 MED ORDER — GLIMEPIRIDE 4 MG PO TABS: 4.0000 mg | ORAL_TABLET | Freq: Every day | ORAL | 2 refills | Status: DC

## 2018-10-01 MED ORDER — FUROSEMIDE 40 MG PO TABS: 40.0000 mg | ORAL_TABLET | Freq: Every day | ORAL | 5 refills | Status: DC

## 2018-10-01 MED ORDER — METFORMIN HCL 1000 MG PO TABS: ORAL_TABLET | ORAL | 0 refills | Status: DC

## 2018-10-01 MED ORDER — VALSARTAN 40 MG PO TABS: 40.0000 mg | ORAL_TABLET | Freq: Every day | ORAL | 3 refills | Status: DC

## 2018-10-01 MED ORDER — CARVEDILOL 6.25 MG PO TABS: ORAL_TABLET | ORAL | 1 refills | Status: DC

## 2018-10-01 MED ORDER — GLIMEPIRIDE 4 MG PO TABS: ORAL_TABLET | ORAL | 2 refills | Status: DC

## 2018-10-01 NOTE — Patient Instructions (Addendum)
It was great to meet you today! Thank you for letting me participate in your care!  Today, we discussed your diabetes and high blood pressure. I hope you can find a way to make your medications more affordable and I will work with you as much as I can to help you get the medications you need to control your blood pressure and diabetes. I have increased your Glimepiride to two pills per day but total you will be taking 6mg .  I have refilled Metformin, Carvedilol, Lasix, and Valsartan.  Please return in 3 months so we can recheck your blood sugar.  Be well, Jules Schick, DO PGY-2, [facility]

## 2018-10-01 NOTE — Progress Notes (Signed)
Subjective: Chief Complaint  Patient presents with  . Diabetes     HPI: Grace Ferrell is a 52 y.o. presenting to clinic today to discuss the following:  T2DM Patient presents for follow up for her diabetes. She admits that she does not do well in keeping her blood sugar down. We talked at great length today about the necessity for controlling her blood sugar and how keeping it well controlled reduces her risk of complications such as retinopathy, kidney disease, heart disease, and peripheral neuropathy. Finances are a major obstacle for her in getting better glucose control.   Her finances leave limited food options as well as making certain medications that are indicated that show a benefit for her unrealistic as she cannot afford them. She endorses at times having to choose between medicine, food, or bills. I encouraged her to reapply for an orange card so that medication costs would not be an issue.  HTN Patient is out of meds but is well controlled at this visit at 130/68. She has just run out of medications and can typically afford these and states she does take them as prescribed.  Health Maintenance: urine creatnine, foot exam     ROS noted in HPI.   Past Medical, Surgical, Social, and Family History Reviewed & Updated per EMR.   Pertinent Historical Findings include:   Social History   Tobacco Use  Smoking Status Current Every Day Smoker  . Packs/day: 0.50  . Years: 21.00  . Pack years: 10.50  . Types: Cigarettes  Smokeless Tobacco Never Used    Objective: BP (!) 144/68   Pulse 79   Temp 97.7 F (36.5 C) (Oral)   Ht 5\' 2"  (1.575 m)   Wt 164 lb (74.4 kg)   SpO2 98%   BMI 30.00 kg/m  Vitals and nursing notes reviewed  Physical Exam Gen: Alert and Oriented x 3, NAD HEENT: Normocephalic, atraumatic CV: RRR, no murmurs, normal S1, S2 split Resp: CTAB, no wheezing, rales, or rhonchi, comfortable work of breathing MSK: See diabetic foot exam under quality  metrics Ext: no clubbing, cyanosis, or edema Neuro: No gross deficits Skin: warm, dry, intact, no rashes  Results for orders placed or performed in visit on 10/01/18 (from the past 72 hour(s))  HgB A1c     Status: Abnormal   Collection Time: 10/01/18  2:28 PM  Result Value Ref Range   Hemoglobin A1C     HbA1c POC (<> result, manual entry)     HbA1c, POC (prediabetic range)     HbA1c, POC (controlled diabetic range) 9.3 (A) 0.0 - 7.0 %    Assessment/Plan:  DM2 (diabetes mellitus, type 2) Since last visit patient has lost insurance and has not been able to afford Januvia. She will reapply for an orange card. A1c poorly controlled at 9.3 - Increasing Glimipride from 4mg  to 6mg . - STOP all regular soda and increase water intake, diet soda better than regular soda but sill encouraged water. Stopping soda consumption should also help her save some money.  Essential hypertension BP well controlled at this visit. - Refilled Carvedilol, Lasix, and Valsartan   PATIENT EDUCATION PROVIDED: See AVS    Diagnosis and plan along with any newly prescribed medication(s) were discussed in detail with this patient today. The patient verbalized understanding and agreed with the plan. Patient advised if symptoms worsen return to clinic or ER.   Health Maintainance:   Orders Placed This Encounter  Procedures  . HgB  A1c    No orders of the defined types were placed in this encounter.    Jules Schick, DO 10/01/2018, 2:47 PM PGY-2 [facility]

## 2018-10-16 ENCOUNTER — Encounter: Payer: Self-pay | Admitting: Family Medicine

## 2018-10-16 NOTE — Assessment & Plan Note (Signed)
Since last visit patient has lost insurance and has not been able to afford Januvia. She will reapply for an orange card. A1c poorly controlled at 9.3 - Increasing Glimipride from 4mg  to 6mg . - STOP all regular soda and increase water intake, diet soda better than regular soda but sill encouraged water. Stopping soda consumption should also help her save some money.

## 2018-10-16 NOTE — Assessment & Plan Note (Signed)
BP well controlled at this visit. - Refilled Carvedilol, Lasix, and Valsartan

## 2018-10-19 ENCOUNTER — Encounter (HOSPITAL_COMMUNITY): Payer: Self-pay | Admitting: Emergency Medicine

## 2018-10-19 ENCOUNTER — Emergency Department (HOSPITAL_COMMUNITY)
Admission: EM | Admit: 2018-10-19 | Discharge: 2018-10-19 | Disposition: A | Discharge status: Home / Self Care | Payer: Self-pay | Attending: Emergency Medicine | Admitting: Emergency Medicine

## 2018-10-19 ENCOUNTER — Emergency Department (HOSPITAL_COMMUNITY): Payer: Self-pay

## 2018-10-19 DIAGNOSIS — E119 Type 2 diabetes mellitus without complications: Secondary | ICD-10-CM | POA: Insufficient documentation

## 2018-10-19 DIAGNOSIS — Z7982 Long term (current) use of aspirin: Secondary | ICD-10-CM | POA: Insufficient documentation

## 2018-10-19 DIAGNOSIS — R05 Cough: Secondary | ICD-10-CM | POA: Insufficient documentation

## 2018-10-19 DIAGNOSIS — R059 Cough, unspecified: Secondary | ICD-10-CM

## 2018-10-19 DIAGNOSIS — J111 Influenza due to unidentified influenza virus with other respiratory manifestations: Secondary | ICD-10-CM | POA: Insufficient documentation

## 2018-10-19 DIAGNOSIS — E876 Hypokalemia: Secondary | ICD-10-CM | POA: Insufficient documentation

## 2018-10-19 DIAGNOSIS — F17210 Nicotine dependence, cigarettes, uncomplicated: Secondary | ICD-10-CM | POA: Insufficient documentation

## 2018-10-19 DIAGNOSIS — R69 Illness, unspecified: Secondary | ICD-10-CM

## 2018-10-19 DIAGNOSIS — Z7984 Long term (current) use of oral hypoglycemic drugs: Secondary | ICD-10-CM | POA: Insufficient documentation

## 2018-10-19 DIAGNOSIS — Z79899 Other long term (current) drug therapy: Secondary | ICD-10-CM | POA: Insufficient documentation

## 2018-10-19 DIAGNOSIS — I10 Essential (primary) hypertension: Secondary | ICD-10-CM | POA: Insufficient documentation

## 2018-10-19 LAB — COMPREHENSIVE METABOLIC PANEL
ALT: 12 U/L (ref 0–44)
AST: 37 U/L (ref 15–41)
Albumin: 3.9 g/dL (ref 3.5–5.0)
Alkaline Phosphatase: 66 U/L (ref 38–126)
Anion gap: 16 — ABNORMAL HIGH (ref 5–15)
BUN: 14 mg/dL (ref 6–20)
CHLORIDE: 95 mmol/L — AB (ref 98–111)
CO2: 26 mmol/L (ref 22–32)
Calcium: 8.7 mg/dL — ABNORMAL LOW (ref 8.9–10.3)
Creatinine, Ser: 1.17 mg/dL — ABNORMAL HIGH (ref 0.44–1.00)
GFR calc Af Amer: >60 mL/min (ref >60)
GFR calc non Af Amer: 53 mL/min — ABNORMAL LOW (ref >60)
Glucose, Bld: 76 mg/dL (ref 70–99)
Potassium: 2.8 mmol/L — ABNORMAL LOW (ref 3.5–5.1)
Sodium: 137 mmol/L (ref 135–145)
Total Bilirubin: 0.4 mg/dL (ref 0.3–1.2)
Total Protein: 7.2 g/dL (ref 6.5–8.1)

## 2018-10-19 LAB — URINALYSIS, ROUTINE W REFLEX MICROSCOPIC
Bilirubin Urine: NEGATIVE
Glucose, UA: NEGATIVE mg/dL
HGB URINE DIPSTICK: NEGATIVE
Ketones, ur: 5 mg/dL — AB
Leukocytes, UA: NEGATIVE
Nitrite: NEGATIVE
Protein, ur: 30 mg/dL — AB
Specific Gravity, Urine: 1.021 (ref 1.005–1.030)
pH: 5 (ref 5.0–8.0)

## 2018-10-19 LAB — I-STAT BETA HCG BLOOD, ED (MC, WL, AP ONLY): I-stat hCG, quantitative: <5 m[IU]/mL (ref <5)

## 2018-10-19 LAB — CBC
HCT: 36.6 % (ref 36.0–46.0)
Hemoglobin: 11.6 g/dL — ABNORMAL LOW (ref 12.0–15.0)
MCH: 25.9 pg — ABNORMAL LOW (ref 26.0–34.0)
MCHC: 31.7 g/dL (ref 30.0–36.0)
MCV: 81.7 fL (ref 80.0–100.0)
Platelets: 128 10*3/uL — ABNORMAL LOW (ref 150–400)
RBC: 4.48 MIL/uL (ref 3.87–5.11)
RDW: 15.3 % (ref 11.5–15.5)
WBC: 3.7 10*3/uL — ABNORMAL LOW (ref 4.0–10.5)
nRBC: 0 % (ref 0.0–0.2)

## 2018-10-19 LAB — CBG MONITORING, ED
Glucose-Capillary: 71 mg/dL (ref 70–99)
Glucose-Capillary: 61 mg/dL — ABNORMAL LOW (ref 70–99)

## 2018-10-19 LAB — LIPASE, BLOOD: Lipase: 33 U/L (ref 11–51)

## 2018-10-19 IMAGING — CR DG CHEST 2V
2 series · 2 of 2 positions shown · non-contrast
Comparison: [DATE] chest radiograph

CLINICAL DATA: Cough and fever for 2 days.

EXAM:
CHEST - 2 VIEW

[w chest pa]
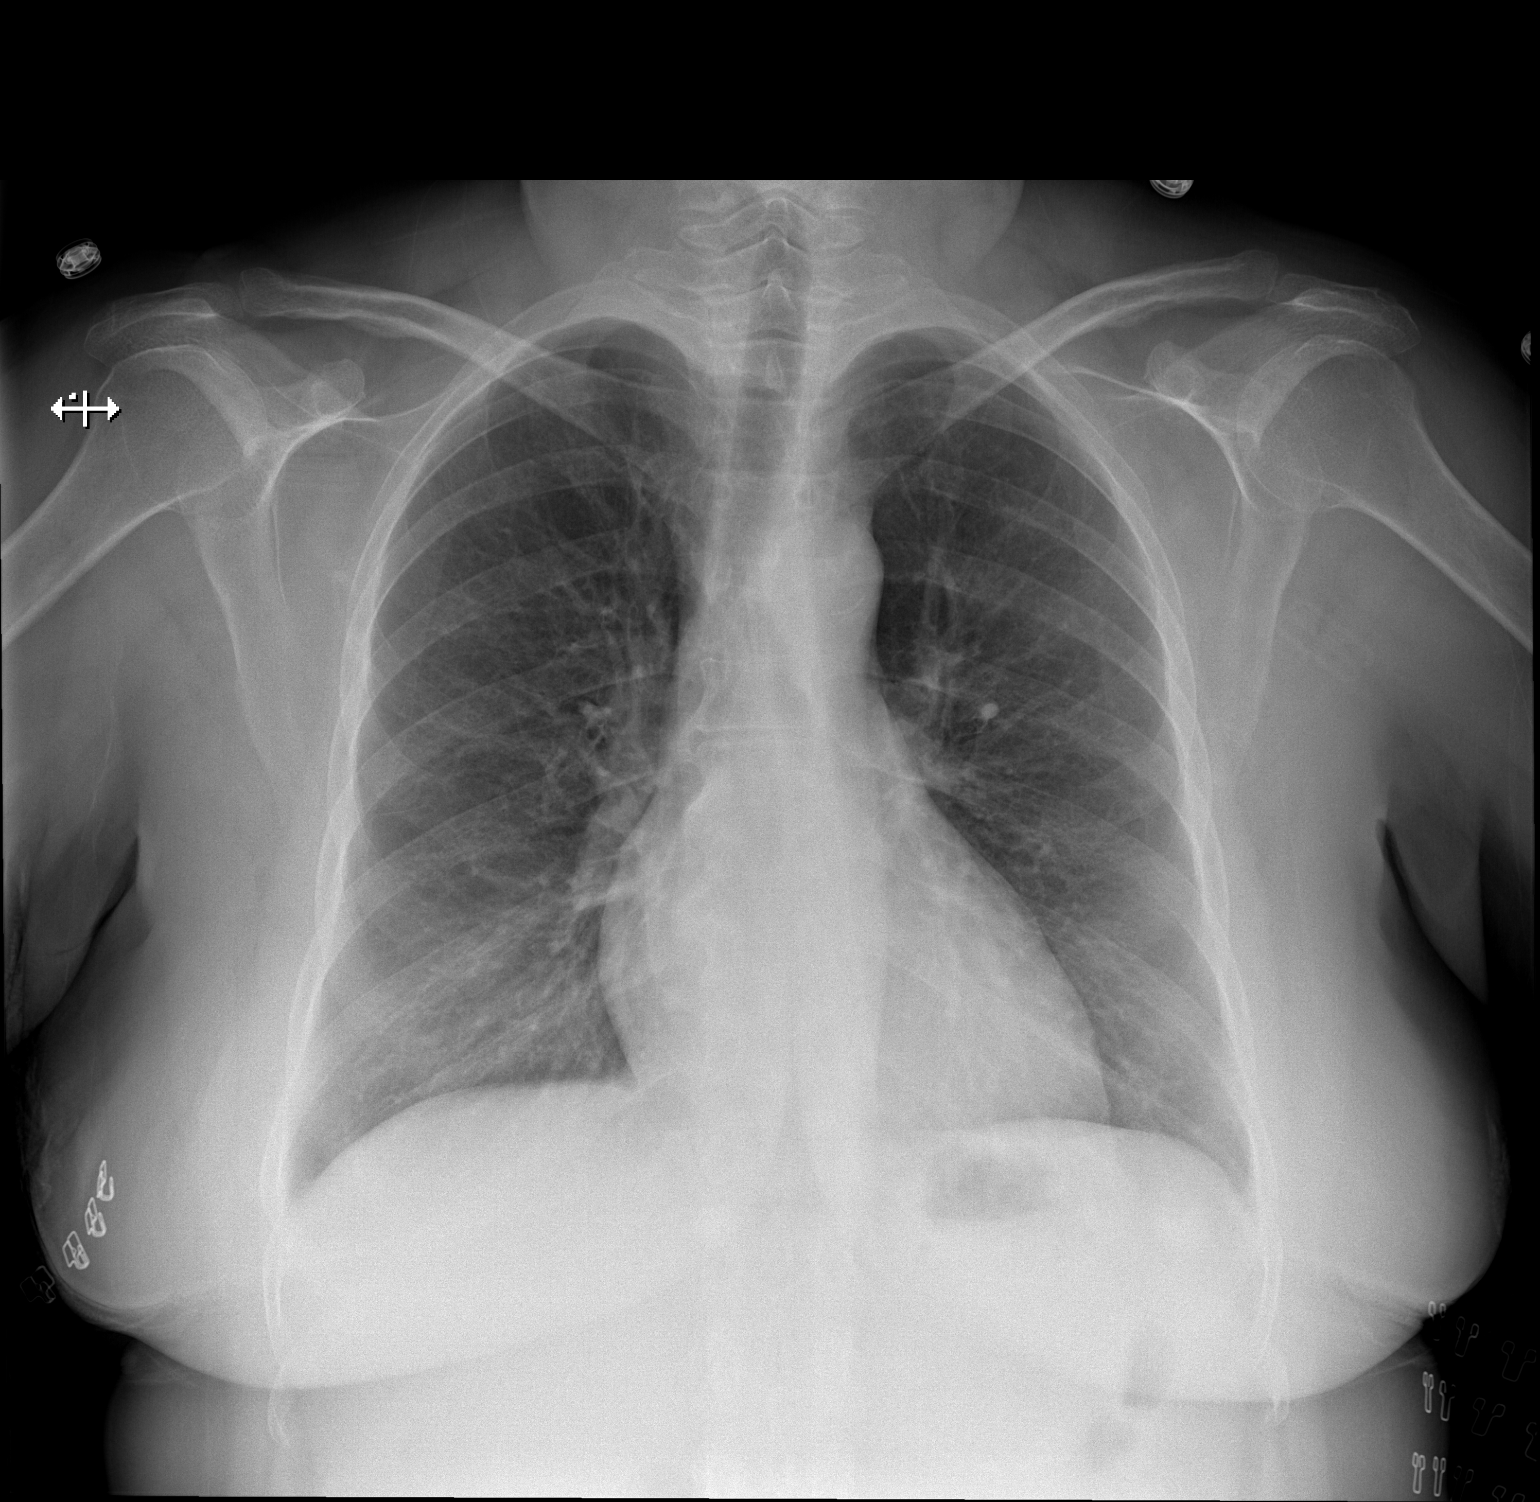

[w chest lat]
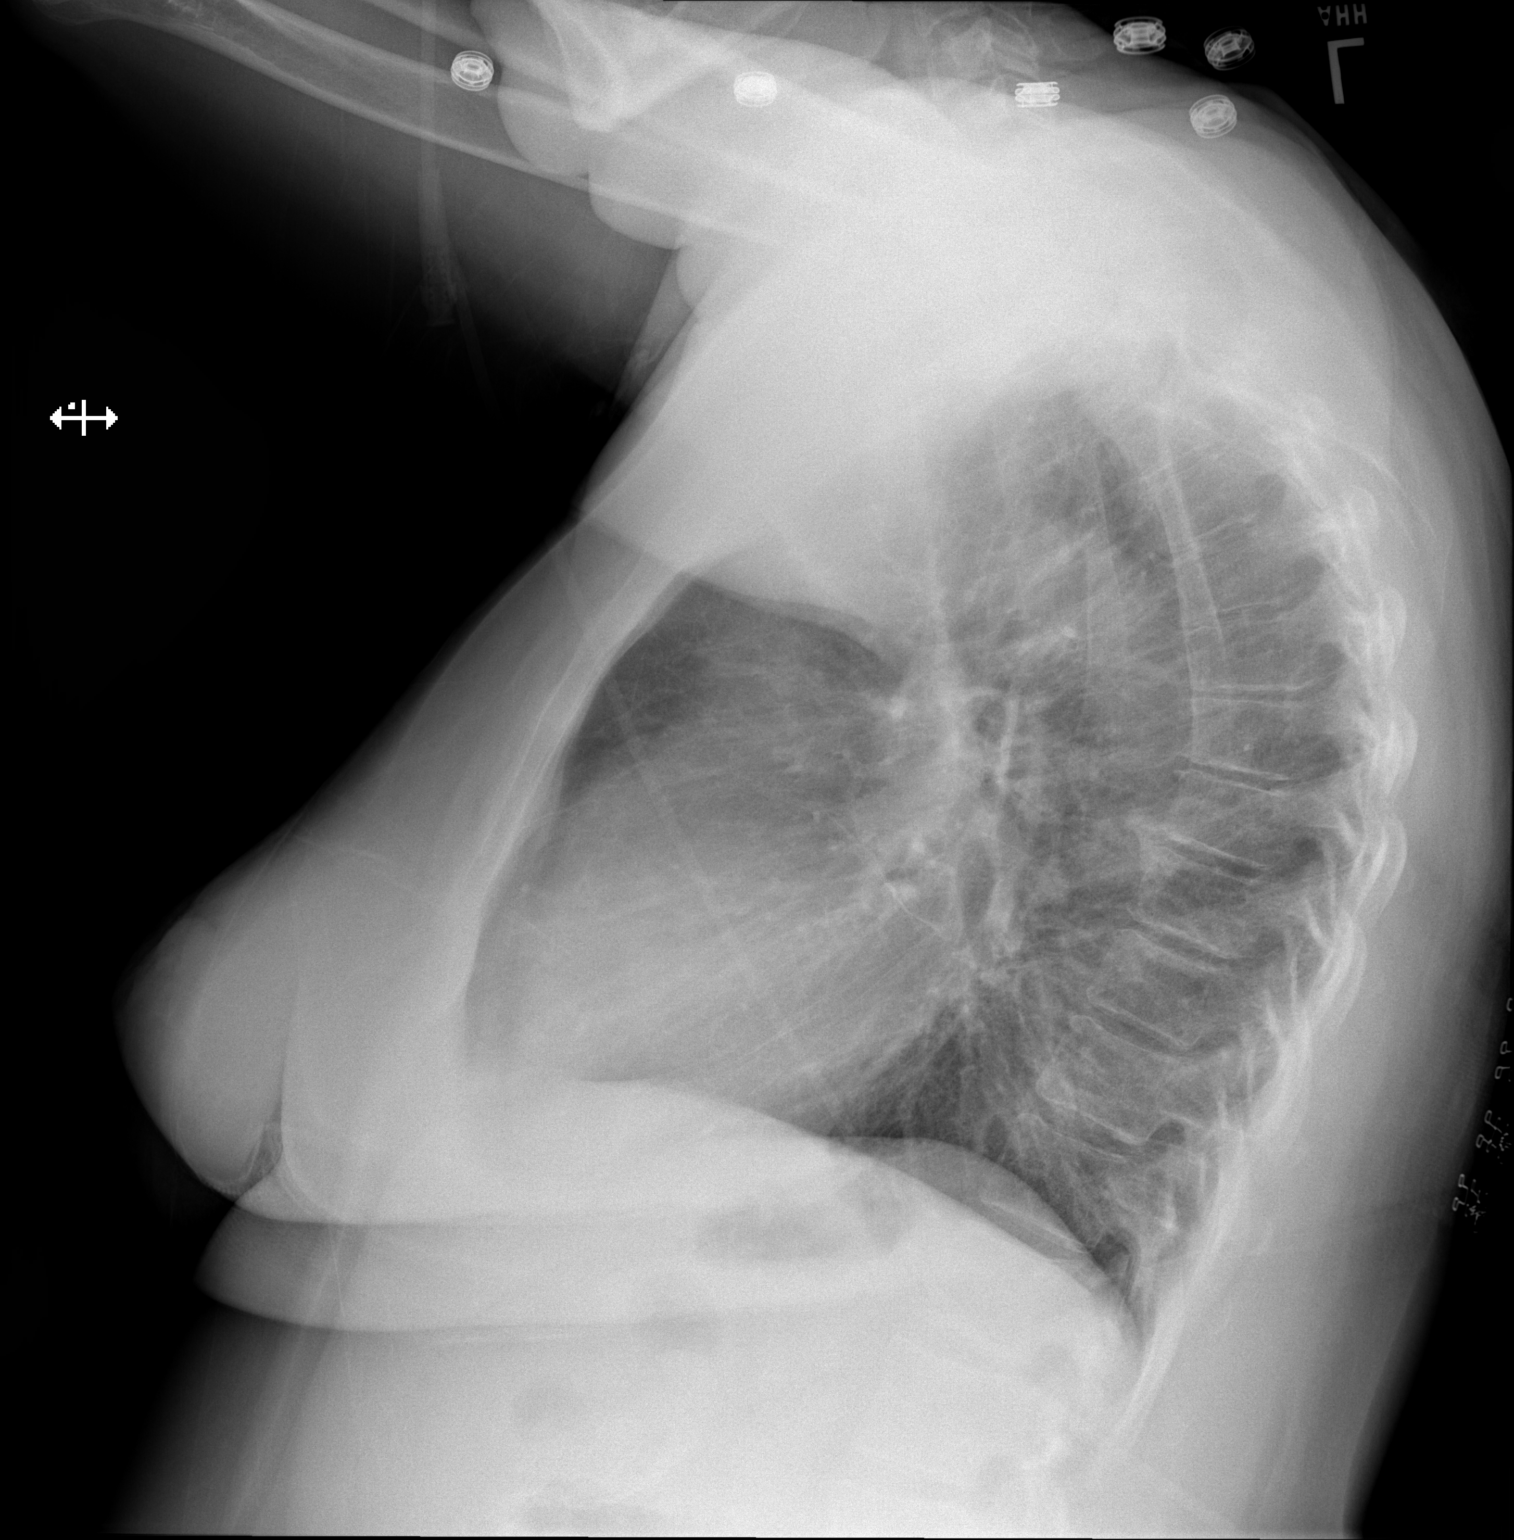

[2 of 2 positions shown; findings below may reference images not displayed]

FINDINGS: The cardiomediastinal silhouette is unremarkable.

There is no evidence of focal airspace disease, pulmonary edema,
suspicious pulmonary nodule/mass, pleural effusion, or pneumothorax.

No acute bony abnormalities are identified.
IMPRESSION: No active cardiopulmonary disease.

## 2018-10-19 MED ORDER — POTASSIUM CHLORIDE CRYS ER 20 MEQ PO TBCR: 20.0000 meq | EXTENDED_RELEASE_TABLET | Freq: Two times a day (BID) | ORAL | 0 refills | Status: DC

## 2018-10-19 MED ORDER — SODIUM CHLORIDE 0.9 % IV BOLUS
1000.0000 mL | Freq: Once | INTRAVENOUS | Status: AC
  Administered 2018-10-19: 1000 mL via INTRAVENOUS

## 2018-10-19 MED ORDER — BENZONATATE 100 MG PO CAPS: 100.0000 mg | ORAL_CAPSULE | Freq: Three times a day (TID) | ORAL | 0 refills | Status: DC

## 2018-10-19 MED ORDER — POTASSIUM CHLORIDE CRYS ER 20 MEQ PO TBCR
60.0000 meq | EXTENDED_RELEASE_TABLET | Freq: Once | ORAL | Status: AC
  Administered 2018-10-19: 60 meq via ORAL
  Filled 2018-10-19: qty 3

## 2018-10-19 NOTE — ED Notes (Signed)
Patient transported to X-ray 

## 2018-10-19 NOTE — ED Provider Notes (Signed)
[facility]   CSN: [id number] Arrival date & time: 10/19/18  1514     History   Chief Complaint Chief Complaint  Patient presents with  . Abdominal Pain  . Chills    HPI Grace Ferrell is a 52 y.o. female.  Patient with history of DM, CHF on Lasix -- presents with flu-like symptoms for the past 2 days. Symptoms started as a frontal HA with sinus pain and pressure. She then developed chills, generalized bodyaches and cough. HA is improving.  No N/V/D.  She has some epigastric pain and mid-chest pain but this is with coughing. No LE swelling. The onset of this condition was acute. The course is constant. Aggravating factors: none. Alleviating factors: none.       Past Medical History:  Diagnosis Date  . Diabetes mellitus   . Hypercholesteremia   . Hypertension     Patient Active Problem List   Diagnosis Date Noted  . Healthcare maintenance 01/28/2017  . Chronic systolic heart failure (HCC) 10/26/2015  . Adjustment disorder with depressed mood 12/30/2014  . Moderate mitral regurgitation by prior echocardiogram 10/21/2014  . Moderate aortic regurgitation 10/21/2014  . Essential hypertension   . Menopause 04/11/2012  . Chronic sinusitis 04/11/2012  . Mixed hyperlipidemia due to type 2 diabetes mellitus (HCC) 03/12/2012  . TOBACCO USER 07/29/2009  . DM2 (diabetes mellitus, type 2) (HCC) 12/18/2006  . DEPRESSIVE DISORDER, NOS 12/18/2006    Past Surgical History:  Procedure Laterality Date  . ENDOMETRIAL ABLATION    . KNEE SURGERY    . TUBAL LIGATION       OB History    Gravida  3   Para  3   Term  3   Preterm      AB      Living  3     SAB      TAB      Ectopic      Multiple      Live Births  3            Home Medications    Prior to Admission medications   Medication Sig Start Date End Date Taking? Authorizing Provider  atorvastatin (LIPITOR) 40 MG tablet Take 1 tablet (40 mg total) by  mouth daily. 09/03/18  Yes Lockamy, Timothy, DO  carvedilol (COREG) 6.25 MG tablet TAKE ONE TABLET BY MOUTH TWICE A DAY WITH A MEAL 10/01/18  Yes Lockamy, Timothy, DO  metFORMIN (GLUCOPHAGE) 1000 MG tablet TAKE 1 TABLET BY MOUTH TWICE DAILY WITH A MEAL 10/01/18  Yes Lockamy, Timothy, DO  aspirin 81 MG EC tablet Take 1 tablet (81 mg total) by mouth daily. Patient not taking: Reported on 10/19/2018 09/03/18   Arlyce Harman, DO  cetirizine (ZYRTEC) 10 MG tablet Take 1 tablet (10 mg total) by mouth daily. 09/03/18   Arlyce Harman, DO  furosemide (LASIX) 40 MG tablet Take 1 tablet (40 mg total) by mouth daily. 10/01/18   Arlyce Harman, DO  glimepiride (AMARYL) 2 MG tablet Take 1 tablet (2 mg total) by mouth daily before breakfast. 10/01/18   Lockamy, Timothy, DO  glimepiride (AMARYL) 4 MG tablet TAKE 1 TABLET BY MOUTH ONCE DAILY WITH BREAKFAST 10/01/18   Lockamy, Timothy, DO  mometasone (NASONEX) 50 MCG/ACT nasal spray Place 2 sprays into the nose daily. 09/03/18   Arlyce Harman, DO  ranitidine (ZANTAC) 150 MG capsule Take 1 capsule (150 mg total) by mouth 2 (two) times daily. 09/03/18  Lockamy, Timothy, DO  sitaGLIPtin (JANUVIA) 100 MG tablet Take 1 tablet (100 mg total) by mouth daily. 09/03/18   Arlyce Harman, DO  valsartan (DIOVAN) 40 MG tablet Take 1 tablet (40 mg total) by mouth daily. 10/01/18   Arlyce Harman, DO    Family History Family History  Problem Relation Age of Onset  . Stroke Mother   . Hypertension Sister   . Diabetes Sister   . Hypertension Brother   . Breast cancer Maternal Grandmother   . Hypertension Brother   . Hypertension Sister   . Stroke Sister   . Hypertension Sister     Social History Social History   Tobacco Use  . Smoking status: Current Every Day Smoker    Packs/day: 0.50    Years: 21.00    Pack years: 10.50    Types: Cigarettes  . Smokeless tobacco: Never Used  Substance Use Topics  . Alcohol use: No    Alcohol/week: 0.0 standard  drinks  . Drug use: No     Allergies   Codeine; Darvocet [propoxyphene n-acetaminophen]; Enalapril; Morphine and related; Sulfamethoxazole-trimethoprim; and Vicodin [hydrocodone-acetaminophen]   Review of Systems Review of Systems  Constitutional: Positive for chills. Negative for fever.  HENT: Positive for congestion, rhinorrhea and sinus pressure. Negative for sore throat.   Eyes: Negative for redness.  Respiratory: Positive for cough.   Cardiovascular: Negative for chest pain and leg swelling.  Gastrointestinal: Negative for abdominal pain, diarrhea, nausea and vomiting.  Genitourinary: Negative for dysuria.  Musculoskeletal: Positive for myalgias.  Skin: Negative for rash.  Neurological: Positive for headaches.     Physical Exam Updated Vital Signs BP (!) 149/62   Pulse 73   Temp 99.7 F (37.6 C) (Oral)   Resp 15   Ht 5\' 2"  (1.575 m)   Wt 73.9 kg   SpO2 98%   BMI 29.81 kg/m   Physical Exam Vitals signs and nursing note reviewed.  Constitutional:      Appearance: She is well-developed.  HENT:     Head: Normocephalic and atraumatic.  Eyes:     General:        Right eye: No discharge.        Left eye: No discharge.     Conjunctiva/sclera: Conjunctivae normal.  Neck:     Musculoskeletal: Normal range of motion and neck supple.  Cardiovascular:     Rate and Rhythm: Normal rate and regular rhythm.     Heart sounds: Normal heart sounds.  Pulmonary:     Effort: Pulmonary effort is normal.     Breath sounds: Normal breath sounds.  Abdominal:     Palpations: Abdomen is soft.     Tenderness: There is abdominal tenderness in the epigastric area.  Skin:    General: Skin is warm and dry.  Neurological:     Mental Status: She is alert.      ED Treatments / Results  Labs (all labs ordered are listed, but only abnormal results are displayed) Labs Reviewed  COMPREHENSIVE METABOLIC PANEL - Abnormal; Notable for the following components:      Result Value    Potassium 2.8 (*)    Chloride 95 (*)    Creatinine, Ser 1.17 (*)    Calcium 8.7 (*)    GFR calc non Af Amer 53 (*)    Anion gap 16 (*)    All other components within normal limits  CBC - Abnormal; Notable for the following components:   WBC 3.7 (*)  Hemoglobin 11.6 (*)    MCH 25.9 (*)    Platelets 128 (*)    All other components within normal limits  URINALYSIS, ROUTINE W REFLEX MICROSCOPIC - Abnormal; Notable for the following components:   Color, Urine AMBER (*)    Ketones, ur 5 (*)    Protein, ur 30 (*)    Bacteria, UA RARE (*)    All other components within normal limits  LIPASE, BLOOD  I-STAT BETA HCG BLOOD, ED ([facility], [facility], [facility] ONLY)  CBG MONITORING, ED  CBG MONITORING, ED    EKG None  Radiology Dg Chest 2 View  Result Date: 10/19/2018 CLINICAL DATA:  Cough and fever for 2 days. EXAM: CHEST - 2 VIEW COMPARISON:  10/21/2014 chest radiograph FINDINGS: The cardiomediastinal silhouette is unremarkable. There is no evidence of focal airspace disease, pulmonary edema, suspicious pulmonary nodule/mass, pleural effusion, or pneumothorax. No acute bony abnormalities are identified. IMPRESSION: No active cardiopulmonary disease. Electronically Signed   By: Harmon PierJeffrey  Hu M.D.   On: 10/19/2018 18:26    Procedures Procedures (including critical care time)  Medications Ordered in ED Medications  sodium chloride 0.9 % bolus 1,000 mL (1,000 mLs Intravenous New Bag/Given 10/19/18 1816)  potassium chloride SA (K-DUR,KLOR-CON) CR tablet 60 mEq (60 mEq Oral Given 10/19/18 1815)     Initial Impression / Assessment and Plan / ED Course  I have reviewed the triage vital signs and the nursing notes.  Pertinent labs & imaging results that were available during my care of the patient were reviewed by me and considered in my medical decision making (see chart for details).     Patient seen and examined. Work-up reviewed.  Patient with mild AKI and hypokalemia.  Will treat with IV fluids and  oral potassium.  Will check chest x-ray to ensure no pneumonia.  Patient with viral respiratory infection symptoms versus flu.  I do not expect significant heart failure exacerbation.  Vital signs reviewed and are as follows: BP (!) 149/62   Pulse 73   Temp 99.7 F (37.6 C) (Oral)   Resp 15   Ht 5\' 2"  (1.575 m)   Wt 73.9 kg   SpO2 98%   BMI 29.81 kg/m    8:02 PM patient states that she is feeling better with some fluids.  Updated on results.  At this point, she is agreeable to discharge to home.  Patient discharged to home. Encouraged to rest and drink plenty of fluids.  Patient told to return to ED or see their primary doctor if their symptoms worsen, high fever not controlled with tylenol, persistent vomiting, they feel they are dehydrated, persistent chest pain, or if they have any other concerns.  Patient verbalized understanding and agreed with plan.      Final Clinical Impressions(s) / ED Diagnoses   Final diagnoses:  Influenza-like illness  Hypokalemia  Cough   ILI: Patient with 2 days of symptoms, chills, low-grade fever in the ED.  Chest x-ray without signs of pneumonia or fluid overload.  Creatinine slightly elevated so patient was given IV fluids.  She appears well, in no distress.  Will treat symptomatically.  Hypokalemia: Likely exacerbated by Lasix use.  Patient is given potassium orally in the emergency department and discharged home with 1 week of supplementation.  Encouraged to follow-up with PCP for recheck of her potassium level.  ED Discharge Orders         Ordered    benzonatate (TESSALON) 100 MG capsule  Every 8 hours  10/19/18 1959    potassium chloride SA (K-DUR,KLOR-CON) 20 MEQ tablet  2 times daily     10/19/18 1959           Renne Crigler, Cordelia Poche 10/19/18 2004    Tilden Fossa, MD 10/19/18 2352

## 2018-10-19 NOTE — ED Triage Notes (Signed)
Patient here from home with complaints of abd pain "all over" x2 days. Chills, fever. No Tylenol or Motrin. Diabetic.

## 2018-10-19 NOTE — ED Notes (Signed)
Pt cbg was 69

## 2018-10-19 NOTE — ED Notes (Signed)
Peripheral IV located in the left arm discontinued.

## 2018-10-19 NOTE — ED Notes (Signed)
PA Ivin Booty made aware of blood sugar, coke and crackers given

## 2018-10-19 NOTE — Discharge Instructions (Signed)
Please read and follow all provided instructions.  Your diagnoses today include:  1. Influenza-like illness   2. Hypokalemia   3. Cough     Tests performed today include:  Chest x-ray-no pneumonia  Blood counts electrolytes -shows slight dehydration and low potassium  Vital signs. See below for your results today.   Medications prescribed:   Tessalon Perles - cough suppressant medication   Potassium -supplement  Take any prescribed medications only as directed.  Home care instructions:  Follow any educational materials contained in this packet. Please continue drinking plenty of fluids. Use over-the-counter cold and flu medications as needed as directed on packaging for symptom relief. You may also use ibuprofen or tylenol as directed on packaging for pain or fever.   BE VERY CAREFUL not to take multiple medicines containing Tylenol (also called acetaminophen). Doing so can lead to an overdose which can damage your liver and cause liver failure and possibly death.   Follow-up instructions: Please follow-up with your primary care provider in the next 3 days for further evaluation of your symptoms and recheck of your potassium level.  Return instructions:   Please return to the Emergency Department if you experience worsening symptoms.  Please return if you have a high fever greater than 101 degrees not controlled with over-the-counter medications, persistent vomiting and cannot keep down fluids, or worsening trouble breathing.  Please return if you have any other emergent concerns.  Additional Information:  Your vital signs today were: BP (!) 145/57    Pulse 79    Temp 99.7 F (37.6 C) (Oral)    Resp 18    Ht 5\' 2"  (1.575 m)    Wt 73.9 kg    SpO2 93%    BMI 29.81 kg/m  If your blood pressure (BP) was elevated above 135/85 this visit, please have this repeated by your doctor within one month.

## 2018-10-22 ENCOUNTER — Telehealth: Payer: Self-pay | Admitting: Family Medicine

## 2018-10-22 NOTE — Telephone Encounter (Signed)
**  After Hours/ Emergency Line Call*  Received a call to report that Grace Ferrell is having a reaction to the cough medication that was prescribed to her in the ED. From chart review she was given tessalon.  Endorsing rash on her arms.  Denying lip swelling, mucosal involvement, oropharyngeal swelling.  Recommended that she discontinue taking tessalon and take benadryl 25 mg q8 hours. If no improvement she should be seen by PCP.  Red flags discussed.  Patient stated her understanding and agreement. Will forward to PCP.  Tillman Sers, DO PGY-2, [facility]

## 2018-10-30 ENCOUNTER — Ambulatory Visit (INDEPENDENT_AMBULATORY_CARE_PROVIDER_SITE_OTHER): Payer: Self-pay | Admitting: Family Medicine

## 2018-10-30 ENCOUNTER — Other Ambulatory Visit: Payer: Self-pay

## 2018-10-30 VITALS — BP 128/68 | HR 68 | Temp 98.3°F | Ht 62.0 in | Wt 156.0 lb

## 2018-10-30 DIAGNOSIS — R05 Cough: Secondary | ICD-10-CM

## 2018-10-30 DIAGNOSIS — R059 Cough, unspecified: Secondary | ICD-10-CM

## 2018-10-30 NOTE — Assessment & Plan Note (Signed)
Patient is a 53 year old presents today complaining of persistent cough in the setting of recent flu diagnosis.  Lung exam is unremarkable.  Discussed with patient etiology of cough in the setting of congestion and postnasal drip.  Minimal concern for pneumonia given normal vital signs with good oxygen saturation on room air and normal lung exam.  Given patient lack of insurance she will purchase over-the-counter cough medication (Robitussin).  Recommended hydration and rest.  Patient understand that cough could last few more days before improving.  She will return if symptoms worsen for further evaluation.

## 2018-10-30 NOTE — Progress Notes (Signed)
   Subjective:    Patient ID: Sunday Corn, female    DOB: [birth year], 53 y.o.   MRN: [id number]   CC: Cough  HPI: Patient is a 53 year old female presents today complaining of cough.  Patient was seen diagnosed urgent care with the flu last week.  Patient reports that symptoms of myalgia and rhinorrhea have improved but cough has persisted.  Patient has been using nasal saline she has help with congestion.  She denies any recent fevers, shortness of breath, chest pain, abdominal pain, nausea, vomiting or diarrhea.  Patient has not tried any medication for her cough.   Smoking status reviewed   ROS: all other systems were reviewed and are negative other than in the HPI   Past Medical History:  Diagnosis Date  . Diabetes mellitus   . Hypercholesteremia   . Hypertension     Past Surgical History:  Procedure Laterality Date  . ENDOMETRIAL ABLATION    . KNEE SURGERY    . TUBAL LIGATION      Past medical history, surgical, family, and social history reviewed and updated in the EMR as appropriate.  Objective:  BP 128/68   Pulse 68   Temp 98.3 F (36.8 C) (Oral)   Ht 5\' 2"  (1.575 m)   Wt 156 lb (70.8 kg)   SpO2 99%   BMI 28.53 kg/m   Vitals and nursing note reviewed  General: NAD, pleasant, able to participate in exam Cardiac: RRR, normal heart sounds, no murmurs. 2+ radial and PT pulses bilaterally Respiratory: CTAB, normal effort, No wheezes, rales or rhonchi Abdomen: soft, nontender, nondistended, no hepatic or splenomegaly, +BS Extremities: no edema or cyanosis. WWP. Skin: warm and dry, no rashes noted Neuro: alert and oriented x4, no focal deficits Psych: Normal affect and mood   Assessment & Plan:   Cough Patient is a 53 year old presents today complaining of persistent cough in the setting of recent flu diagnosis.  Lung exam is unremarkable.  Discussed with patient etiology of cough in the setting of congestion and postnasal drip.  Minimal concern for pneumonia  given normal vital signs with good oxygen saturation on room air and normal lung exam.  Given patient lack of insurance she will purchase over-the-counter cough medication (Robitussin).  Recommended hydration and rest.  Patient understand that cough could last few more days before improving.  She will return if symptoms worsen for further evaluation.    Lovena Neighbours, MD [facility] PGY-3

## 2018-12-02 ENCOUNTER — Ambulatory Visit: Payer: Self-pay

## 2019-01-05 ENCOUNTER — Ambulatory Visit: Payer: Self-pay | Admitting: Family Medicine

## 2019-01-11 ENCOUNTER — Other Ambulatory Visit: Payer: Self-pay | Admitting: Family Medicine

## 2019-01-19 ENCOUNTER — Other Ambulatory Visit: Payer: Self-pay

## 2019-01-19 ENCOUNTER — Telehealth (INDEPENDENT_AMBULATORY_CARE_PROVIDER_SITE_OTHER): Payer: Self-pay | Admitting: Family Medicine

## 2019-01-19 ENCOUNTER — Ambulatory Visit: Payer: Self-pay | Admitting: Family Medicine

## 2019-01-19 DIAGNOSIS — I10 Essential (primary) hypertension: Secondary | ICD-10-CM

## 2019-01-19 DIAGNOSIS — I5022 Chronic systolic (congestive) heart failure: Secondary | ICD-10-CM

## 2019-01-19 DIAGNOSIS — E119 Type 2 diabetes mellitus without complications: Secondary | ICD-10-CM

## 2019-01-19 DIAGNOSIS — R61 Generalized hyperhidrosis: Secondary | ICD-10-CM | POA: Insufficient documentation

## 2019-01-19 MED ORDER — VALSARTAN 40 MG PO TABS: 40.0000 mg | ORAL_TABLET | Freq: Every day | ORAL | 0 refills | Status: DC

## 2019-01-19 MED ORDER — GLIMEPIRIDE 4 MG PO TABS: ORAL_TABLET | ORAL | 2 refills | Status: DC

## 2019-01-19 MED ORDER — GLIMEPIRIDE 2 MG PO TABS: 2.0000 mg | ORAL_TABLET | Freq: Every day | ORAL | 3 refills | Status: DC

## 2019-01-19 MED ORDER — CARVEDILOL 6.25 MG PO TABS: ORAL_TABLET | ORAL | 0 refills | Status: DC

## 2019-01-19 NOTE — Assessment & Plan Note (Signed)
Patient does not note any hypoglycemic episodes.  Refilled glimepiride per her request. -Should have A1c check when current pandemic allows

## 2019-01-19 NOTE — Progress Notes (Signed)
[facility] Telemedicine Visit  Patient consented to have visit conducted via telephone.  Encounter participants: Patient: Grace Ferrell  Provider: Solmon Ice Meccariello  Others (if applicable): None  Chief Complaint: Refills  HPI: Patient is scheduled for a telemedicine appointment today.  She states that she is only needing some refills on her Coreg, valsartan, glimepiride.  She states that she does not currently have any complaints.  She denies any chest pain, shortness of breath, hypoglycemic episodes, dizziness.  She does note that for the past few months, she states she has been sweating more at nighttime.  She states this only occurs at night.  She states that she thinks that she has gone through menopause, but she is not sure because she had an ablation a few years ago and never bled afterwards.  She does not report any large amounts of weight change, and still has a regular appetite.  No recent fevers, chills.  ROS: Per HPI  Pertinent PMHx: Hypertension, chronic systolic heart failure, diabetes type 2  Exam:  Respiratory: Patient is speaking in complete sentences and does not appear to be in any respiratory distress over the phone.  Assessment/Plan:  Essential hypertension Refilled patient's Coreg and valsartan for 1 month supply per her request. -Should have blood pressure recheck in 2 months  DM2 (diabetes mellitus, type 2) Patient does not note any hypoglycemic episodes.  Refilled glimepiride per her request. -Should have A1c check when current pandemic allows    Time spent on phone with patient: 8 minutes

## 2019-01-19 NOTE — Assessment & Plan Note (Addendum)
Refilled patient's Coreg and valsartan for 1 month supply per her request. -Should have blood pressure recheck in 2 months

## 2019-02-09 ENCOUNTER — Other Ambulatory Visit: Payer: Self-pay | Admitting: Family Medicine

## 2019-02-09 DIAGNOSIS — E119 Type 2 diabetes mellitus without complications: Secondary | ICD-10-CM

## 2019-02-11 ENCOUNTER — Other Ambulatory Visit: Payer: Self-pay

## 2019-02-16 MED ORDER — ATORVASTATIN CALCIUM 40 MG PO TABS: 40.0000 mg | ORAL_TABLET | Freq: Every day | ORAL | 11 refills | Status: DC

## 2019-04-17 ENCOUNTER — Other Ambulatory Visit: Payer: Self-pay | Admitting: Family Medicine

## 2019-05-04 ENCOUNTER — Other Ambulatory Visit: Payer: Self-pay | Admitting: Family Medicine

## 2019-05-04 DIAGNOSIS — I10 Essential (primary) hypertension: Secondary | ICD-10-CM

## 2019-05-04 DIAGNOSIS — I5022 Chronic systolic (congestive) heart failure: Secondary | ICD-10-CM

## 2019-06-03 ENCOUNTER — Other Ambulatory Visit: Payer: Self-pay | Admitting: Family Medicine

## 2019-06-03 DIAGNOSIS — I5022 Chronic systolic (congestive) heart failure: Secondary | ICD-10-CM

## 2019-06-03 DIAGNOSIS — I10 Essential (primary) hypertension: Secondary | ICD-10-CM

## 2019-06-11 ENCOUNTER — Ambulatory Visit: Payer: Self-pay

## 2019-06-14 ENCOUNTER — Telehealth (INDEPENDENT_AMBULATORY_CARE_PROVIDER_SITE_OTHER): Payer: Self-pay | Admitting: Family Medicine

## 2019-06-14 ENCOUNTER — Other Ambulatory Visit: Payer: Self-pay

## 2019-06-14 DIAGNOSIS — J302 Other seasonal allergic rhinitis: Secondary | ICD-10-CM

## 2019-06-14 DIAGNOSIS — R0989 Other specified symptoms and signs involving the circulatory and respiratory systems: Secondary | ICD-10-CM

## 2019-06-14 MED ORDER — LORATADINE 10 MG PO TABS: 10.0000 mg | ORAL_TABLET | Freq: Every day | ORAL | 11 refills | Status: DC

## 2019-06-14 MED ORDER — MONTELUKAST SODIUM 10 MG PO TABS: 10.0000 mg | ORAL_TABLET | Freq: Every day | ORAL | 3 refills | Status: DC

## 2019-06-14 MED ORDER — FLUTICASONE PROPIONATE 50 MCG/ACT NA SUSP: 2.0000 | Freq: Every day | NASAL | 6 refills | Status: DC

## 2019-06-14 NOTE — Progress Notes (Signed)
[facility] Telemedicine Visit  Patient consented to have virtual visit. Method of visit: Telephone  Encounter participants: Patient: Grace Ferrell - located at home in [facility] Provider: Nuala Alpha - located at [facility] Others (if applicable): none  Chief Complaint: "Sinus Trouble"  HPI: Deals with "sinus trouble" for a long time but recently it has "gotten beyond control" and OTC medications are not controlling her symptoms. First noticed it getting worse last Thursday. She is having lots of sneezing, sinus pressure and pain with headache in the frontal area. She is taking OTC Tylenol for headaches. She has not had any fever, she has had some chills, no coughing, no shortness of breath, no chest tightness or pain, no nausea, vomiting, or diarrhea. She also has fatigue and endorses lack of taste.   ROS: per HPI  Pertinent PMHx: chronic sinusitis, seasonal allergies  Exam:  Respiratory: speaks in full sentences with no difficulty   Assessment/Plan:  Seasonal allergies Patient with history of chronic sinusitis with allergy problems and has not been taking any allergy medication to control her symptoms recently. Given she has chronic allergies and issues I want to start her on an antihistamine, flonase for congestion, and selective leukotriene receptor antagonist to get control of her chronic problem. If no improvement consider getting her to come in for a in person visit and to start antibiotic given her history of chronic sinusitis. Did not feel the need to start one yet as her symptoms have been ongoing for less than one week. Patient adamant that she has been self-quarantined and no possible exposure to COVID. Reasons to seek emergency care discussed.   - Start Claritin 10mg  daily - Start Singulair 10mg  daily - Sent in Flonase 2 puffs nasal spray each nostril daily    Time spent during visit with patient: >15 minutes  Harolyn Rutherford, DO [place],  PGY-3

## 2019-06-14 NOTE — Assessment & Plan Note (Signed)
Patient with history of chronic sinusitis with allergy problems and has not been taking any allergy medication to control her symptoms recently. Given she has chronic allergies and issues I want to start her on an antihistamine, flonase for congestion, and selective leukotriene receptor antagonist to get control of her chronic problem. If no improvement consider getting her to come in for a in person visit and to start antibiotic given her history of chronic sinusitis. Did not feel the need to start one yet as her symptoms have been ongoing for less than one week. Patient adamant that she has been self-quarantined and no possible exposure to COVID. Reasons to seek emergency care discussed.   - Start Claritin 10mg  daily - Start Singulair 10mg  daily - Sent in Flonase 2 puffs nasal spray each nostril daily

## 2019-07-06 ENCOUNTER — Other Ambulatory Visit: Payer: Self-pay | Admitting: Family Medicine

## 2019-07-06 DIAGNOSIS — E119 Type 2 diabetes mellitus without complications: Secondary | ICD-10-CM

## 2019-07-07 ENCOUNTER — Other Ambulatory Visit: Payer: Self-pay | Admitting: Family Medicine

## 2019-07-07 DIAGNOSIS — I10 Essential (primary) hypertension: Secondary | ICD-10-CM

## 2019-07-07 DIAGNOSIS — I5022 Chronic systolic (congestive) heart failure: Secondary | ICD-10-CM

## 2019-07-25 ENCOUNTER — Other Ambulatory Visit: Payer: Self-pay | Admitting: Family Medicine

## 2019-08-10 ENCOUNTER — Other Ambulatory Visit: Payer: Self-pay | Admitting: Family Medicine

## 2019-08-10 DIAGNOSIS — I10 Essential (primary) hypertension: Secondary | ICD-10-CM

## 2019-08-10 DIAGNOSIS — I5022 Chronic systolic (congestive) heart failure: Secondary | ICD-10-CM

## 2019-09-06 ENCOUNTER — Telehealth: Payer: Self-pay | Admitting: *Deleted

## 2019-09-06 NOTE — Telephone Encounter (Signed)
LM for patient to call back and update [place] on if she has had her flu shot this year.  Also if she has insurance she can get it here if not.  Jazmin Hartsell,CMA

## 2019-09-09 ENCOUNTER — Other Ambulatory Visit: Payer: Self-pay | Admitting: Family Medicine

## 2019-09-09 DIAGNOSIS — I5022 Chronic systolic (congestive) heart failure: Secondary | ICD-10-CM

## 2019-09-09 DIAGNOSIS — I10 Essential (primary) hypertension: Secondary | ICD-10-CM

## 2019-09-10 ENCOUNTER — Other Ambulatory Visit: Payer: Self-pay | Admitting: Family Medicine

## 2019-09-30 ENCOUNTER — Other Ambulatory Visit: Payer: Self-pay | Admitting: Family Medicine

## 2019-09-30 DIAGNOSIS — E119 Type 2 diabetes mellitus without complications: Secondary | ICD-10-CM

## 2019-10-01 ENCOUNTER — Other Ambulatory Visit: Payer: Self-pay | Admitting: Family Medicine

## 2019-10-01 DIAGNOSIS — E119 Type 2 diabetes mellitus without complications: Secondary | ICD-10-CM

## 2019-10-09 ENCOUNTER — Other Ambulatory Visit: Payer: Self-pay | Admitting: Family Medicine

## 2019-10-09 DIAGNOSIS — E119 Type 2 diabetes mellitus without complications: Secondary | ICD-10-CM

## 2019-10-27 ENCOUNTER — Other Ambulatory Visit: Payer: Self-pay | Admitting: Family Medicine

## 2019-11-16 ENCOUNTER — Other Ambulatory Visit: Payer: Self-pay | Admitting: Family Medicine

## 2019-11-16 DIAGNOSIS — I5022 Chronic systolic (congestive) heart failure: Secondary | ICD-10-CM

## 2019-11-16 DIAGNOSIS — I10 Essential (primary) hypertension: Secondary | ICD-10-CM

## 2019-12-12 ENCOUNTER — Other Ambulatory Visit: Payer: Self-pay | Admitting: Family Medicine

## 2019-12-12 DIAGNOSIS — I10 Essential (primary) hypertension: Secondary | ICD-10-CM

## 2019-12-12 DIAGNOSIS — I5022 Chronic systolic (congestive) heart failure: Secondary | ICD-10-CM

## 2020-01-20 ENCOUNTER — Telehealth: Payer: Self-pay

## 2020-01-20 ENCOUNTER — Other Ambulatory Visit: Payer: Self-pay | Admitting: Family Medicine

## 2020-01-20 NOTE — Telephone Encounter (Signed)
Called patient and she states that she is not confused and did not inquire at pharmacy.  Grace Ferrell is confused she states.  Patient is aware of what she should be taking but appreciated the call.  Glennie Hawk, CMA

## 2020-01-22 ENCOUNTER — Other Ambulatory Visit: Payer: Self-pay | Admitting: Family Medicine

## 2020-02-13 ENCOUNTER — Other Ambulatory Visit: Payer: Self-pay | Admitting: Family Medicine

## 2020-02-13 DIAGNOSIS — I5022 Chronic systolic (congestive) heart failure: Secondary | ICD-10-CM

## 2020-02-13 DIAGNOSIS — I10 Essential (primary) hypertension: Secondary | ICD-10-CM

## 2020-02-16 ENCOUNTER — Other Ambulatory Visit: Payer: Self-pay | Admitting: Family Medicine

## 2020-02-16 DIAGNOSIS — I10 Essential (primary) hypertension: Secondary | ICD-10-CM

## 2020-02-16 DIAGNOSIS — I5022 Chronic systolic (congestive) heart failure: Secondary | ICD-10-CM

## 2020-02-21 ENCOUNTER — Telehealth (INDEPENDENT_AMBULATORY_CARE_PROVIDER_SITE_OTHER): Payer: Self-pay | Admitting: Family Medicine

## 2020-02-21 ENCOUNTER — Other Ambulatory Visit: Payer: Self-pay

## 2020-02-21 VITALS — Ht 62.0 in | Wt 156.0 lb

## 2020-02-21 DIAGNOSIS — J0110 Acute frontal sinusitis, unspecified: Secondary | ICD-10-CM

## 2020-02-21 MED ORDER — AMOXICILLIN 875 MG PO TABS: 875.0000 mg | ORAL_TABLET | Freq: Two times a day (BID) | ORAL | 0 refills | Status: DC

## 2020-02-21 NOTE — Progress Notes (Signed)
[facility] Telemedicine Visit  Patient consented to have virtual visit and was identified by name and date of birth. Method of visit: Video  Encounter participants: Patient: Grace Ferrell - located at [phone] Provider: Dollene Cleveland - located at family practice clinic Others (if applicable): None  Chief Complaint: Congestion, cough  HPI:  Cough: Patient reports to clinic today reporting that since last Saturday, April 24 (10 days ago) she woke up with a bad headache stating "I could live my head", she slept all day long, reports she could not eat, just drink water.  The symptoms continued and then a few days later on Thursday her stomach began to hurt, had nausea.  Patient reports that she has nasal congestion with a little bit of runny nose, when she tries to blow her nose nothing comes out.  She only had 1 episode of blowing thick yellow mucus out of her nose.  She reports feeling pressure in her forehead and in the maxillary sinuses underneath her eye.  Patient reports she is having some shortness of breath with ambulation (for example, walked outside to take the trash out to the dumpster, but had shortness of breath on the return walk as she was approaching her building). Patient reports she is coughing so hard she felt like she was going to throw up.  Patient reports nothing comes up when she coughs.  She reports she can hardly taste anything but can smell just fine.  She is not concerned that she has Covid.  Patient reports she has not been anywhere and has not had any visitors.  No history of CHF, but reports she has a history of COPD (no official diagnosis in patient's record, no PFTs).  Patient states she has not had any fever that she knows of, denies tachycardia.  She had her Covid vaccine on 01/27/2020.  She has been using the following medications at home to help her feel better: - Prescribed Claritin/Loratidine taking daily, not taking Zyrtec or  singulair - Take Flonase daily 2 puffs each night - No Inhalers at home   ROS: per HPI  Pertinent PMHx:  Patient Active Problem List   Diagnosis Date Noted  . Seasonal allergies 06/14/2019  . Night sweats 01/19/2019  . Healthcare maintenance 01/28/2017  . Acute non-recurrent frontal sinusitis 11/28/2015  . Chronic systolic heart failure (HCC) 10/26/2015  . Adjustment disorder with depressed mood 12/30/2014  . Moderate mitral regurgitation by prior echocardiogram 10/21/2014  . Moderate aortic regurgitation 10/21/2014  . Cough   . Essential hypertension   . Menopause 04/11/2012  . Chronic sinusitis 04/11/2012  . Mixed hyperlipidemia due to type 2 diabetes mellitus (HCC) 03/12/2012  . TOBACCO USER 07/29/2009  . DM2 (diabetes mellitus, type 2) (HCC) 12/18/2006  . DEPRESSIVE DISORDER, NOS 12/18/2006    Exam:  Ht 5\' 2"  (1.575 m)   Wt 156 lb (70.8 kg) Comment: Per patient  BMI 28.53 kg/m   Respiratory: Speaking in complete sentences, comfortable work of breathing, coughing very frequently  Assessment/Plan: Acute non-recurrent frontal sinusitis Patient with 10 days of upper respiratory symptom's that have not improved with conservative home therapies.  Patient mentions having history of COPD, however there is no history of this in patient's chart, no PFTs.  Low suspicion of COPD exacerbation.  Patient with history of congestive heart failure, however denies any weight gain.  Patient most likely now with bacterial sinusitis and will treat with antibiotics. - [place] Card expired 09-11-2019, will  look into getting another one - Continue Claritin 10 mg daily  - Flonase daily - Amoxicillin 875mg  BID x10 days - Honey 3 times daily for cough (patient states she is allergic to either Tessalon Perles or Robitussin, rash) - Stay hydrated to help thin mucus - Aleve 500mg  BID, Tylenol 500mg  every 6 hours  - If not better by about Friday 02/25/20 call us back   Time spent during visit with  patient: 31 minutes  Milus Banister, [place], PGY-2 02/22/2020 12:56 PM

## 2020-02-22 ENCOUNTER — Other Ambulatory Visit: Payer: Self-pay | Admitting: Family Medicine

## 2020-02-22 NOTE — Assessment & Plan Note (Signed)
Patient with 10 days of upper respiratory symptom's that have not improved with conservative home therapies.  Patient mentions having history of COPD, however there is no history of this in patient's chart, no PFTs.  Low suspicion of COPD exacerbation.  Patient with history of congestive heart failure, however denies any weight gain.  Patient most likely now with bacterial sinusitis and will treat with antibiotics. - Orange Card expired 09-11-2019, will look into getting another one - Continue Claritin 10 mg daily  - Flonase daily - Amoxicillin 875mg  BID x10 days - Honey 3 times daily for cough (patient states she is allergic to either Tessalon Perles or Robitussin, rash) - Stay hydrated to help thin mucus - Aleve 500mg  BID, Tylenol 500mg  every 6 hours  - If not better by about Friday 02/25/20 call back

## 2020-04-19 ENCOUNTER — Other Ambulatory Visit: Payer: Self-pay | Admitting: Family Medicine

## 2020-04-22 NOTE — Telephone Encounter (Signed)
Needs appointment

## 2020-04-26 NOTE — Telephone Encounter (Signed)
LVM for pt to call office back to assist her in getting an appointment per Dr. Obie Dredge.Claudell Rhody Zimmerman Rumple, CMA

## 2020-04-27 NOTE — Telephone Encounter (Signed)
Pt has appointment scheduled for 05/03/2020. Skipper Dacosta Zimmerman Rumple, CMA

## 2020-05-03 ENCOUNTER — Ambulatory Visit: Payer: Self-pay | Admitting: Family Medicine

## 2020-05-04 ENCOUNTER — Encounter: Payer: Self-pay | Admitting: Family Medicine

## 2020-05-04 ENCOUNTER — Other Ambulatory Visit: Payer: Self-pay

## 2020-05-04 ENCOUNTER — Ambulatory Visit (INDEPENDENT_AMBULATORY_CARE_PROVIDER_SITE_OTHER): Payer: Self-pay | Admitting: Family Medicine

## 2020-05-04 VITALS — BP 130/66 | HR 68 | Wt 162.0 lb

## 2020-05-04 DIAGNOSIS — E119 Type 2 diabetes mellitus without complications: Secondary | ICD-10-CM

## 2020-05-04 DIAGNOSIS — I5022 Chronic systolic (congestive) heart failure: Secondary | ICD-10-CM

## 2020-05-04 DIAGNOSIS — J302 Other seasonal allergic rhinitis: Secondary | ICD-10-CM

## 2020-05-04 DIAGNOSIS — I10 Essential (primary) hypertension: Secondary | ICD-10-CM

## 2020-05-04 LAB — POCT GLYCOSYLATED HEMOGLOBIN (HGB A1C): HbA1c, POC (controlled diabetic range): 9 % — AB (ref 0.0–7.0)

## 2020-05-04 MED ORDER — GLIMEPIRIDE 4 MG PO TABS: ORAL_TABLET | ORAL | 0 refills | Status: DC

## 2020-05-04 MED ORDER — METFORMIN HCL 1000 MG PO TABS: 1000.0000 mg | ORAL_TABLET | Freq: Two times a day (BID) | ORAL | 0 refills | Status: DC

## 2020-05-04 MED ORDER — FLUTICASONE PROPIONATE 50 MCG/ACT NA SUSP: 2.0000 | Freq: Every day | NASAL | 6 refills | Status: DC

## 2020-05-04 MED ORDER — CETIRIZINE HCL 10 MG PO TABS
10.0000 mg | ORAL_TABLET | Freq: Every day | ORAL | 11 refills | Status: DC
Start: 2020-05-04 — End: 2021-06-18

## 2020-05-04 NOTE — Assessment & Plan Note (Addendum)
A1c today 9.0.  Patient did report lows while taking glimepiride 2 mg daily, but this was only when she was not eating when she was taking care of her brother who had Covid.  States that she has not had any lows since then.  Given that cost is an issue, will go ahead and prescribe glimepiride 4 mg daily.  She was given information about medication assistance program.  We will also continue with Metformin at 1000 mg twice daily.  Ultimately would like to get her on an SGLT2 inhibitor given her heart failure.  Will assess if she needs an eye exam at her next visit.  She can also have a foot exam at her next visit.  Does not seem that there is any assistance available for glucometers, she will need to buy this over-the-counter.  Will obtain BMP and lipid panel today.

## 2020-05-04 NOTE — Progress Notes (Signed)
SUBJECTIVE:   CHIEF COMPLAINT / HPI:   Allergies Has been dealing with this off and on for a long time Was in amoxicillin a few months ago and it helped  She felt betetr for a little while Has watery eyes, rhinirrohea, congestion Sometimes nasal muscus Is clear, sometimes tick and mucus She is on claritin She hasn't been using flonase  Hypertension Patient last seen for telemedicine appointment on 01/19/2019 Has been on Coreg and valsartan Lasix 40mg  QD BPs at home - doesn't check Patient denies chest pain, shortness of breath, leg edema  CHF Patient has history of CHF Last echo in 2016, cannot see read, but note from cardiologist did state that patient's ejection fraction had improved Currently on Coreg 6.25 twice daily, Lasix 40 mg daily, valsartan 40 mg daily She does not have a cardiologist  T2DM Last hemoglobin A1c at 10/01/2018 was 9.3 Current regimen: Metformin 1000 mg twice daily, glimepiride 2 mg daily She would like to be on glimepiride 4mg , as this was in the chart She does not have a glucometer, she doesn't like to check She does not currently have insurance [facility] doesn't check She hadn't been eating and had an episode of hypoglycemia in the 60s, had symptoms with this She was taking care of her brother who had COVID at the time   PERTINENT  PMH / PSH: T2DM, CHF, hypertension, HLD  OBJECTIVE:   BP 130/66   Pulse 68   Wt 162 lb (73.5 kg)   SpO2 98%   BMI 29.63 kg/m    Physical Exam:  General: 54 y.o. female in NAD Cardio: RRR no m/r/g Lungs: CTAB, no wheezing, no rhonchi, no crackles, no IWOB on RA Skin: warm and dry Extremities: No edema  Results for orders placed or performed in visit on 05/04/20 (from the past 24 hour(s))  HgB A1c     Status: Abnormal   Collection Time: 05/04/20  2:02 PM  Result Value Ref Range   Hemoglobin A1C     HbA1c POC (<> result, manual entry)     HbA1c, POC (prediabetic range)     HbA1c, POC (controlled diabetic  range) 9.0 (A) 0.0 - 7.0 %      ASSESSMENT/PLAN:   DM2 (diabetes mellitus, type 2) A1c today 9.0.  Patient did report lows while taking glimepiride 2 mg daily, but this was only when she was not eating when she was taking care of her brother who had Covid.  States that she has not had any lows since then.  Given that cost is an issue, will go ahead and prescribe glimepiride 4 mg daily.  She was given information about medication assistance program.  We will also continue with Metformin at 1000 mg twice daily.  Ultimately would like to get her on an SGLT2 inhibitor given her heart failure.  Will assess if she needs an eye exam at her next visit.  She can also have a foot exam at her next visit.  Does not seem that there is any assistance available for glucometers, she will need to buy this over-the-counter.  Will obtain BMP and lipid panel today.  Chronic systolic heart failure (HCC) No signs of fluid overload on exam.  She has not had an echo since 2016.  At this time it seems stable.  Given that she does not have insurance is paying for things out-of-pocket, will just continue with Coreg 6.25 twice daily, Lasix 40 daily, valsartan 40 daily.  Will obtain BMP today.  Essential hypertension BP looks good today.  Continue with current medications.  Will obtain BMP and CBC today.  Patient would like a CBC as she states that she is been feeling very cold.  Seasonal allergies Flonase and Zyrtec prescribed to given that she has symptoms that are consistent with allergies.  Claritin has not been working for her, therefore will change to Zyrtec.  If this does not have improvement, could consider further evaluation for possible chronic sinusitis.  Patient to follow-up if no improvement.   Health maintenance: Given mammogram and colonoscopy paper.  Patient states that she will more than likely do the mammogram, but will likely hold off on colonoscopy given that she does not have insurance.  Could consider  iFOBT testing  Unknown Jim, DO [facility]

## 2020-05-04 NOTE — Assessment & Plan Note (Signed)
Flonase and Zyrtec prescribed to given that she has symptoms that are consistent with allergies.  Claritin has not been working for her, therefore will change to Zyrtec.  If this does not have improvement, could consider further evaluation for possible chronic sinusitis.  Patient to follow-up if no improvement.

## 2020-05-04 NOTE — Assessment & Plan Note (Signed)
BP looks good today.  Continue with current medications.  Will obtain BMP and CBC today.  Patient would like a CBC as she states that she is been feeling very cold.

## 2020-05-04 NOTE — Assessment & Plan Note (Signed)
No signs of fluid overload on exam.  She has not had an echo since 2016.  At this time it seems stable.  Given that she does not have insurance is paying for things out-of-pocket, will just continue with Coreg 6.25 twice daily, Lasix 40 daily, valsartan 40 daily.  Will obtain BMP today.

## 2020-05-04 NOTE — Patient Instructions (Signed)
Thank you for coming to see me today. It was a pleasure. Today we talked about:   Take glimepiride 4mg  and continue metformin.  I will give you a paper about medication assistance.  I have sent in zyrtec and flonase, if you aren't better in 1 month, come back.  If you want to talk about diet, come back to solely talk about this.    Please follow-up with me in 1-2 months.  If you have any questions or concerns, please do not hesitate to call the office at [phone].  Best,   [phone], DO

## 2020-05-05 LAB — BASIC METABOLIC PANEL
BUN/Creatinine Ratio: 8 — ABNORMAL LOW (ref 9–23)
BUN: 6 mg/dL (ref 6–24)
CO2: 27 mmol/L (ref 20–29)
Calcium: 9.5 mg/dL (ref 8.7–10.2)
Chloride: 102 mmol/L (ref 96–106)
Creatinine, Ser: 0.72 mg/dL (ref 0.57–1.00)
GFR calc Af Amer: 108 mL/min/{1.73_m2} (ref >59)
GFR calc non Af Amer: 94 mL/min/{1.73_m2} (ref >59)
Glucose: 157 mg/dL — ABNORMAL HIGH (ref 65–99)
Potassium: 3.3 mmol/L — ABNORMAL LOW (ref 3.5–5.2)
Sodium: 144 mmol/L (ref 134–144)

## 2020-05-05 LAB — CBC
Hematocrit: 37 % (ref 34.0–46.6)
Hemoglobin: 11.7 g/dL (ref 11.1–15.9)
MCH: 24.5 pg — ABNORMAL LOW (ref 26.6–33.0)
MCHC: 31.6 g/dL (ref 31.5–35.7)
MCV: 77 fL — ABNORMAL LOW (ref 79–97)
Platelets: 289 10*3/uL (ref 150–450)
RBC: 4.78 x10E6/uL (ref 3.77–5.28)
RDW: 17.2 % — ABNORMAL HIGH (ref 11.7–15.4)
WBC: 7.3 10*3/uL (ref 3.4–10.8)

## 2020-05-05 LAB — LIPID PANEL
Chol/HDL Ratio: 3.6 ratio (ref 0.0–4.4)
Cholesterol, Total: 137 mg/dL (ref 100–199)
HDL: 38 mg/dL — ABNORMAL LOW (ref >39)
LDL Chol Calc (NIH): 81 mg/dL (ref 0–99)
Triglycerides: 97 mg/dL (ref 0–149)
VLDL Cholesterol Cal: 18 mg/dL (ref 5–40)

## 2020-05-06 ENCOUNTER — Other Ambulatory Visit: Payer: Self-pay | Admitting: Family Medicine

## 2020-05-06 DIAGNOSIS — E876 Hypokalemia: Secondary | ICD-10-CM

## 2020-05-06 MED ORDER — POTASSIUM CHLORIDE ER 20 MEQ PO TBCR: 10.0000 meq | EXTENDED_RELEASE_TABLET | Freq: Two times a day (BID) | ORAL | 0 refills | Status: DC

## 2020-05-10 ENCOUNTER — Other Ambulatory Visit: Payer: Self-pay

## 2020-05-10 DIAGNOSIS — E876 Hypokalemia: Secondary | ICD-10-CM

## 2020-05-11 LAB — BASIC METABOLIC PANEL
BUN/Creatinine Ratio: 8 — ABNORMAL LOW (ref 9–23)
BUN: 6 mg/dL (ref 6–24)
CO2: 25 mmol/L (ref 20–29)
Calcium: 10.3 mg/dL — ABNORMAL HIGH (ref 8.7–10.2)
Chloride: 104 mmol/L (ref 96–106)
Creatinine, Ser: 0.74 mg/dL (ref 0.57–1.00)
GFR calc Af Amer: 105 mL/min/{1.73_m2} (ref >59)
GFR calc non Af Amer: 91 mL/min/{1.73_m2} (ref >59)
Glucose: 198 mg/dL — ABNORMAL HIGH (ref 65–99)
Potassium: 3.6 mmol/L (ref 3.5–5.2)
Sodium: 144 mmol/L (ref 134–144)

## 2020-05-14 ENCOUNTER — Other Ambulatory Visit: Payer: Self-pay | Admitting: Family Medicine

## 2020-05-14 DIAGNOSIS — I5022 Chronic systolic (congestive) heart failure: Secondary | ICD-10-CM

## 2020-05-14 DIAGNOSIS — I10 Essential (primary) hypertension: Secondary | ICD-10-CM

## 2020-05-22 ENCOUNTER — Other Ambulatory Visit: Payer: Self-pay | Admitting: Family Medicine

## 2020-05-22 ENCOUNTER — Other Ambulatory Visit: Payer: Self-pay | Admitting: Obstetrics and Gynecology

## 2020-05-22 DIAGNOSIS — Z1231 Encounter for screening mammogram for malignant neoplasm of breast: Secondary | ICD-10-CM

## 2020-06-20 ENCOUNTER — Ambulatory Visit: Payer: Self-pay

## 2020-06-22 ENCOUNTER — Other Ambulatory Visit: Payer: Self-pay

## 2020-06-22 DIAGNOSIS — E119 Type 2 diabetes mellitus without complications: Secondary | ICD-10-CM

## 2020-06-22 MED ORDER — METFORMIN HCL 1000 MG PO TABS: 1000.0000 mg | ORAL_TABLET | Freq: Two times a day (BID) | ORAL | 3 refills | Status: DC

## 2020-07-13 ENCOUNTER — Ambulatory Visit: Payer: Self-pay | Admitting: *Deleted

## 2020-07-13 ENCOUNTER — Other Ambulatory Visit: Payer: Self-pay

## 2020-07-13 ENCOUNTER — Ambulatory Visit
Admission: RE | Admit: 2020-07-13 | Discharge: 2020-07-13 | Disposition: A | Discharge status: Home / Self Care | Payer: No Typology Code available for payment source | Source: Ambulatory Visit | Attending: Obstetrics and Gynecology | Admitting: Obstetrics and Gynecology

## 2020-07-13 VITALS — BP 150/80 | Temp 96.9°F | Wt 159.6 lb

## 2020-07-13 DIAGNOSIS — Z1239 Encounter for other screening for malignant neoplasm of breast: Secondary | ICD-10-CM

## 2020-07-13 DIAGNOSIS — Z1231 Encounter for screening mammogram for malignant neoplasm of breast: Secondary | ICD-10-CM

## 2020-07-13 DIAGNOSIS — Z1211 Encounter for screening for malignant neoplasm of colon: Secondary | ICD-10-CM

## 2020-07-13 IMAGING — MG DIGITAL SCREENING BILAT W/ TOMO W/ CAD
8 series · 8 of 24 positions shown · non-contrast
Comparison: Previous exam(s).

CLINICAL DATA: Screening.

EXAM:
DIGITAL SCREENING BILATERAL MAMMOGRAM WITH TOMO AND CAD

[L CC synth-2D]
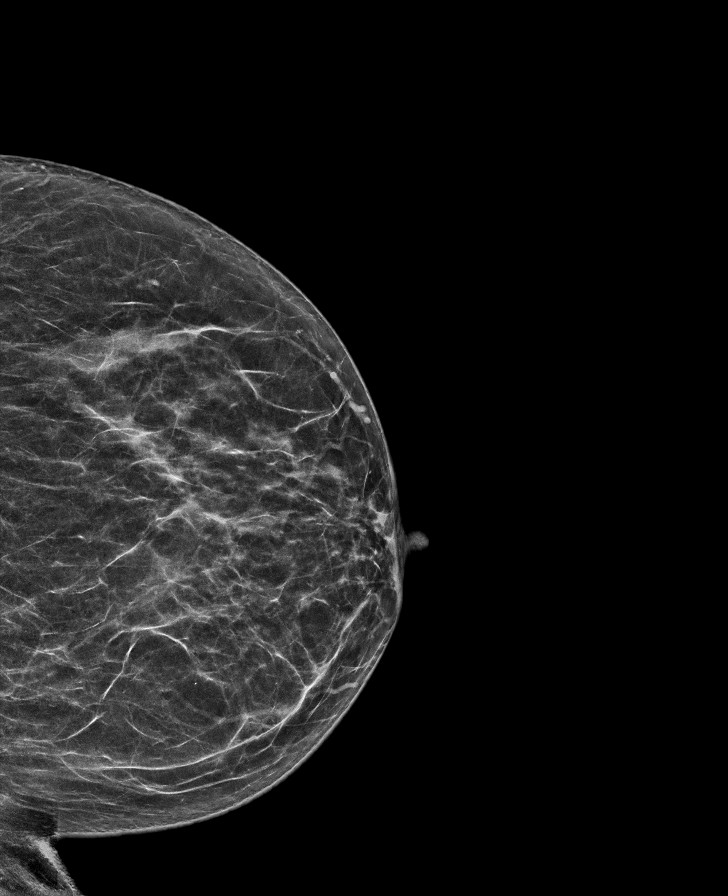

[R CC synth-2D]
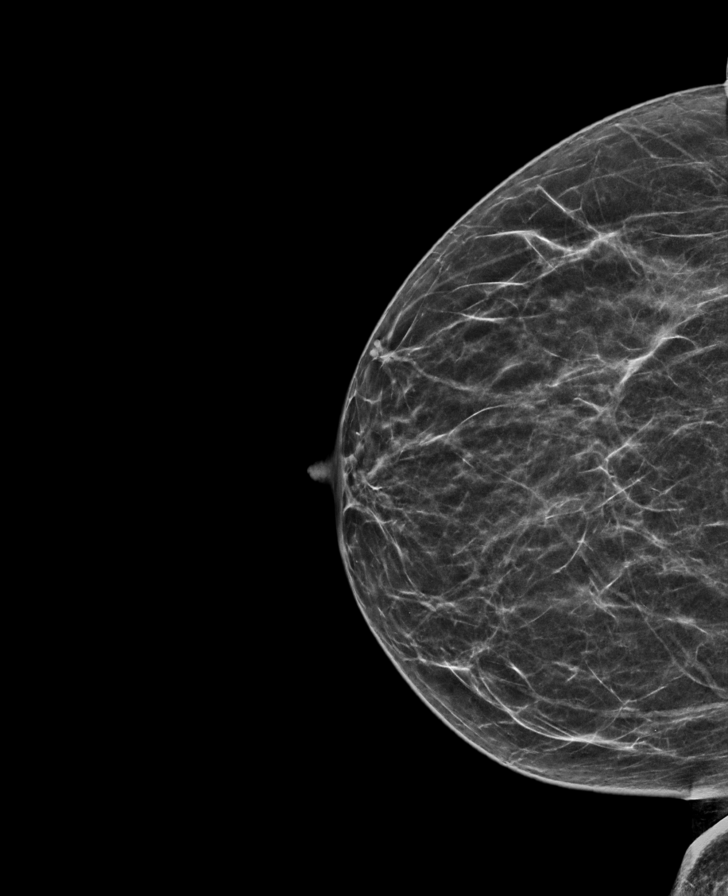

[R MLO synth-2D]
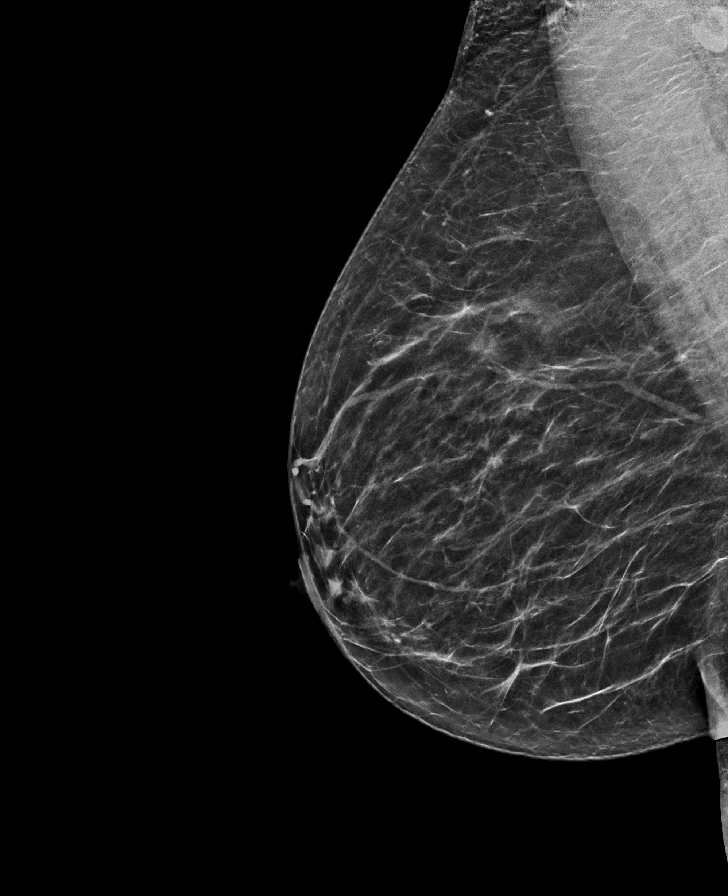

[L MLO synth-2D]
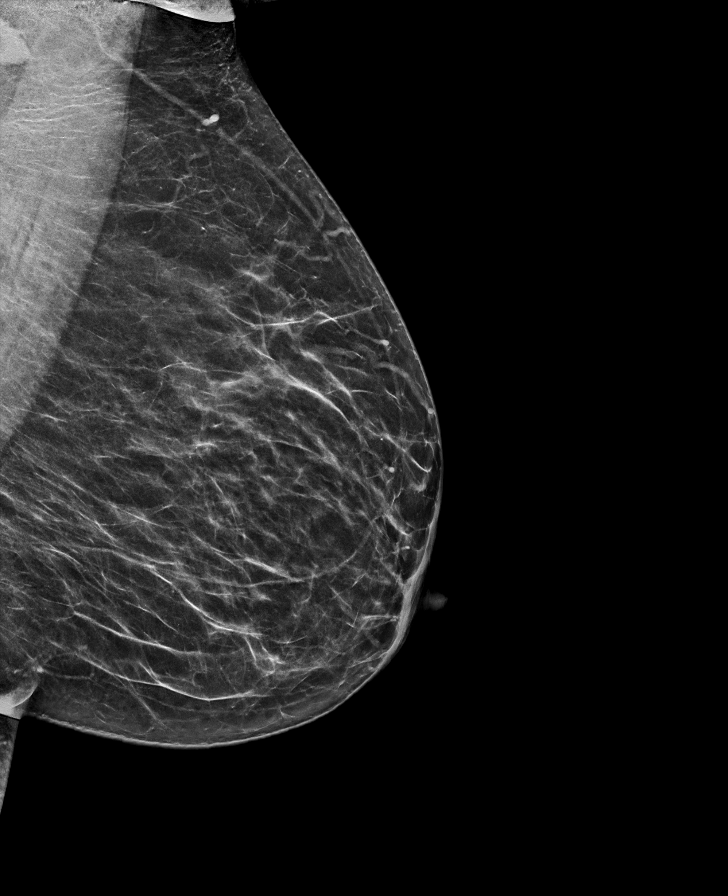

[R CC tomo · tomo slice 28/55.0]
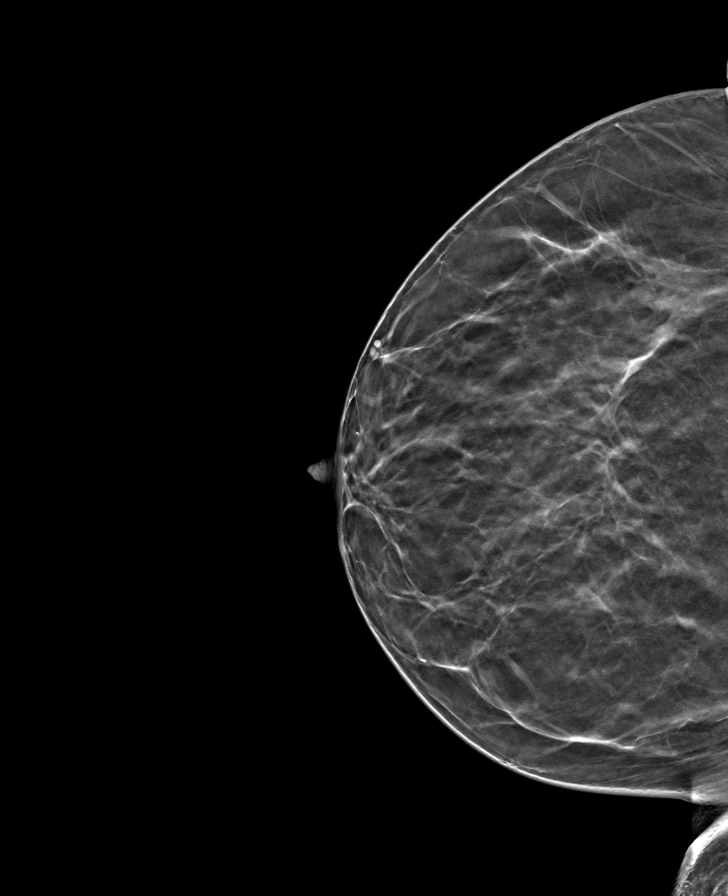

[R MLO tomo · tomo slice 33/65.0]
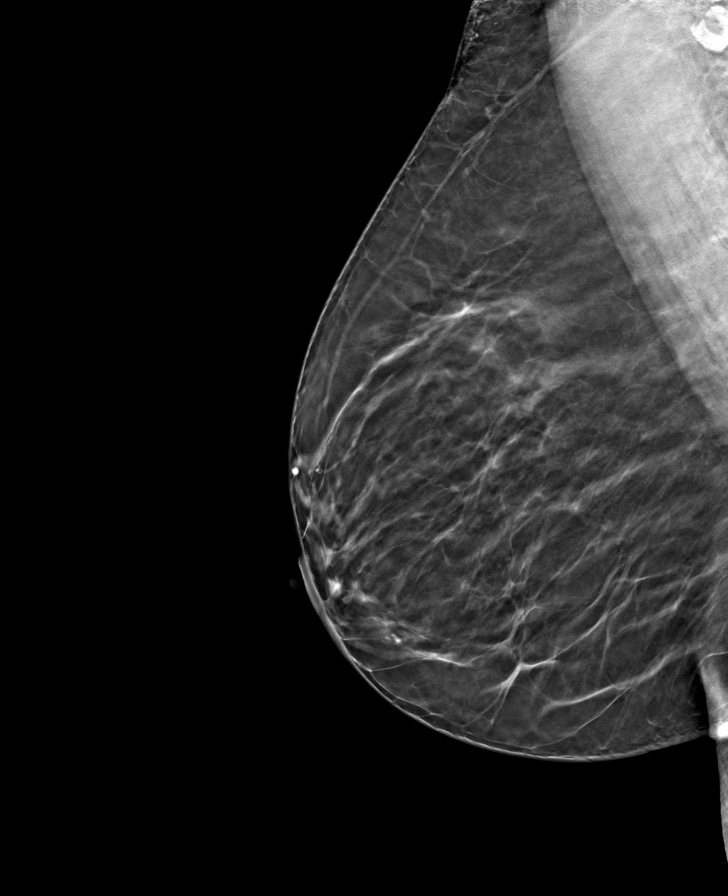

[L CC tomo · tomo slice 30/59.0]
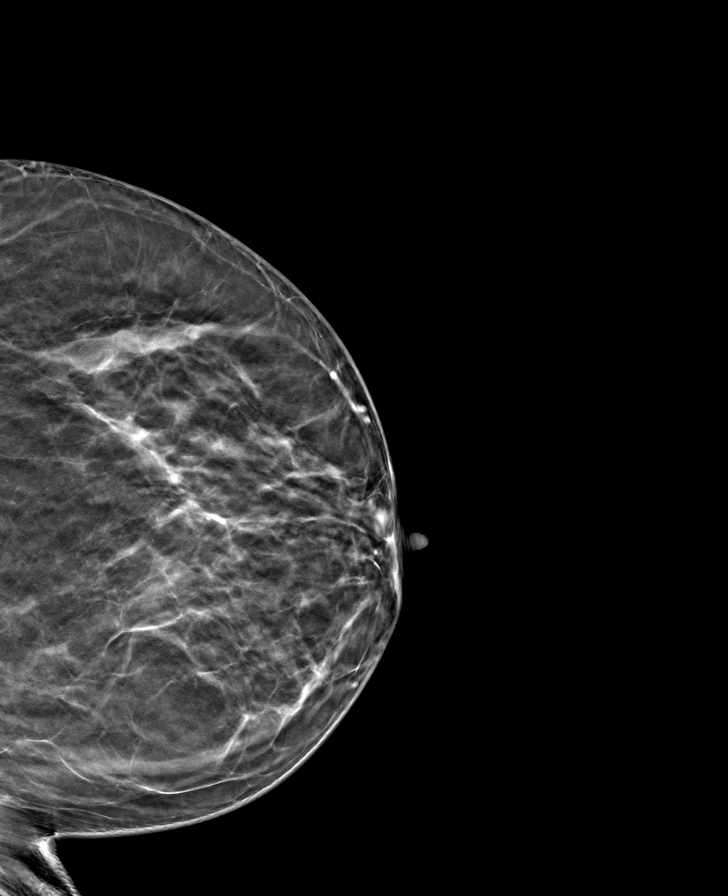

[L MLO tomo · tomo slice 33/64.0]
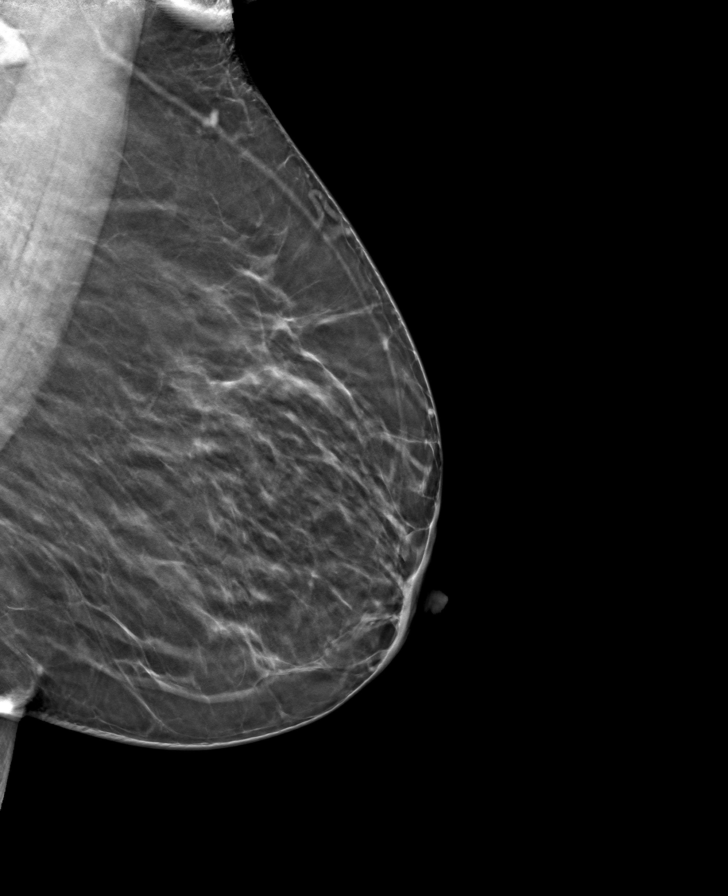

[8 of 24 positions shown; findings below may reference images not displayed]

ACR Breast Density Category b: There are scattered areas of
fibroglandular density.
FINDINGS: There are no findings suspicious for malignancy. Images were
processed with CAD.
IMPRESSION: No mammographic evidence of malignancy. A result letter of this
screening mammogram will be mailed directly to the patient.

RECOMMENDATION:
Screening mammogram in one year. (Code:[TQ])

BI-RADS CATEGORY  1: Negative.

## 2020-07-13 NOTE — Patient Instructions (Signed)
Explained breast self awareness with Sunday Corn. Patient did not need a Pap smear today due to last Pap smear and HPV typing was 06/20/2016. Let her know BCCCP will cover Pap smears and HPV typing every 5 years unless has a history of abnormal Pap smears. Referred patient to the Breast Center of [facility] for a screening mammogram on the mobile unit. Appointment scheduled Thursday, July 13, 2020 at 1030. Patient escorted to the mobile unit following her BCCCP appointment for a mammogram. Let patient know the Breast will follow up with her within the next couple weeks with results of her mammogram by letter or phone. Discussed smoking cessation with patient. Referred patient to the [facility] Quitline and gave resources to free smoking cessation classes offered at [facility]. Sunday Corn verbalized understanding.  Mathhew Buysse, Kathaleen Maser, RN 10:38 AM

## 2020-07-13 NOTE — Progress Notes (Addendum)
[name] is a 54 y.o. female who presents to [facility] clinic today with no complaints.    Pap Smear: Pap smear not completed today. Last Pap smear was 06/20/2016 at [facility] clinic and was normal with negative HPV. Per patient has a history of an abnormal Pap smear 24-34 years that she refused follow up. Per patient all Pap smears have been normal since and she has had more than three Pap smears. Last two Pap smear results are in EPIC.   Physical exam: Breasts Breasts symmetrical. No skin abnormalities bilateral breasts. No nipple retraction bilateral breasts. No nipple discharge bilateral breasts. No lymphadenopathy. No lumps palpated bilateral breasts. No complaints of pain or tenderness on exam.      Pelvic/Bimanual Pap is not indicated today per BCCCP guidelines.   Smoking History: Patient is a current smoker. Discussed smoking cessation with patient. Referred patient to the [facility] Quitline and gave resources to free smoking cessation classes offered at [facility]   Patient Navigation: Patient education provided. Access to services provided for patient through [facility] program. Transportation provided for patient to get home following appointments.  Colorectal Cancer Screening: Per patient has never had colonoscopy completed. Patient stated she completed a FIT Test in 2019. No complaints today. FIT Test given to patient to complete and return to BCCCP.   Breast and Cervical Cancer Risk Assessment: Patient has family history of her younger sister and her maternal grandmother having breast cancer. Patient has no known genetic mutations or history of radiation treatment to the chest before age 80. Patient does not have history of cervical dysplasia, immunocompromised, or DES exposure in-utero.  Risk Assessment    Risk Scores      07/13/2020   Last edited by: Grace Ferrell, CMA   5-year risk: 2.2 %   Lifetime risk: 12 %         A: BCCCP exam without pap smear No  complaints.  P: Referred patient to the [facility] of [facility] for a screening mammogram on the mobile unit. Appointment scheduled Thursday, July 13, 2020 at 1030.  Grace Heidelberg, RN 07/13/2020 10:38 AM

## 2020-07-14 ENCOUNTER — Encounter: Payer: Self-pay | Admitting: Family Medicine

## 2020-07-16 ENCOUNTER — Other Ambulatory Visit: Payer: Self-pay | Admitting: Family Medicine

## 2020-07-16 DIAGNOSIS — I10 Essential (primary) hypertension: Secondary | ICD-10-CM

## 2020-07-16 DIAGNOSIS — I5022 Chronic systolic (congestive) heart failure: Secondary | ICD-10-CM

## 2020-07-18 ENCOUNTER — Other Ambulatory Visit: Payer: Self-pay | Admitting: *Deleted

## 2020-07-18 DIAGNOSIS — E119 Type 2 diabetes mellitus without complications: Secondary | ICD-10-CM

## 2020-07-18 MED ORDER — GLIMEPIRIDE 4 MG PO TABS: ORAL_TABLET | ORAL | 0 refills | Status: DC

## 2020-10-17 ENCOUNTER — Other Ambulatory Visit: Payer: Self-pay | Admitting: *Deleted

## 2020-10-17 DIAGNOSIS — E119 Type 2 diabetes mellitus without complications: Secondary | ICD-10-CM

## 2020-10-17 MED ORDER — METFORMIN HCL 1000 MG PO TABS: 1000.0000 mg | ORAL_TABLET | Freq: Two times a day (BID) | ORAL | 3 refills | Status: DC

## 2020-10-17 MED ORDER — GLIMEPIRIDE 4 MG PO TABS: ORAL_TABLET | ORAL | 0 refills | Status: DC

## 2020-10-17 MED ORDER — FUROSEMIDE 40 MG PO TABS
40.0000 mg | ORAL_TABLET | Freq: Every day | ORAL | 4 refills | Status: DC
Start: 2020-10-17 — End: 2021-11-05

## 2020-10-17 NOTE — Telephone Encounter (Signed)
Also received a fax requesting refill on Glimepiride 2mg  but did not see on current med list.Krista Som , CMA

## 2020-11-27 ENCOUNTER — Other Ambulatory Visit: Payer: Self-pay | Admitting: *Deleted

## 2020-11-27 DIAGNOSIS — E119 Type 2 diabetes mellitus without complications: Secondary | ICD-10-CM

## 2020-11-27 MED ORDER — GLIMEPIRIDE 4 MG PO TABS: ORAL_TABLET | ORAL | 0 refills | Status: DC

## 2020-11-27 MED ORDER — LORATADINE 10 MG PO TABS
10.0000 mg | ORAL_TABLET | Freq: Every day | ORAL | 3 refills | Status: DC
Start: 2020-11-27 — End: 2021-06-18

## 2020-11-27 NOTE — Telephone Encounter (Signed)
Needs appointment

## 2020-11-28 ENCOUNTER — Other Ambulatory Visit: Payer: Self-pay

## 2020-11-28 DIAGNOSIS — I10 Essential (primary) hypertension: Secondary | ICD-10-CM

## 2020-11-28 DIAGNOSIS — I5022 Chronic systolic (congestive) heart failure: Secondary | ICD-10-CM

## 2020-11-28 MED ORDER — VALSARTAN 40 MG PO TABS: 40.0000 mg | ORAL_TABLET | Freq: Every day | ORAL | 0 refills | Status: DC

## 2020-11-29 NOTE — Telephone Encounter (Signed)
Called patient and scheduled appointment.

## 2020-12-12 NOTE — Progress Notes (Signed)
SUBJECTIVE:   CHIEF COMPLAINT / HPI:   HTN Current regimen: Valsartan 40 mg daily, Lasix 40 mg daily, Coreg 6.25 mg BID Last BMP 05/10/2020, creatinine 0.74 at that time Does not check BP at home No chest pain, leg swelling, shortness of breath  T2DM  Current regimen: Metformin 1000 mg twice daily, glimepiride 4 mg daily CBGs doesn't check Has been trying to stop drinking sodas, drinking a lot of water, trying to not stress eat Last A1c 9.0 on 05/04/2020 On ARB and statin Eye exam: hasn't had in the last year due to cost Foot exam needed Last lipid panel 05/04/2020, LDL 81 at that time  HFrEF Last echo 12/2014, cannot read actual read, but states that it was improved at that time Current regimen: Coreg 6.25 twice daily, Lasix 40 mg daily, valsartan 40 mg daily ROS per above Has not had repeat due to cost  Covid booster needed Colonoscopy not yet done due to cost, has done a kit previously, but never heard back and didn't get back, wants iFOBT again  Had a pneumonia shot at [facility]  PERTINENT  PMH / PSH: HTN, moderate MR and AR on echo, HFrEF,T2DM, HLD  OBJECTIVE:   Pulse 69   Ht 5' 2"  (1.575 m)   Wt 160 lb 8 oz (72.8 kg)   SpO2 98%   BMI 29.36 kg/m    BP 144/90  Physical Exam:  General: 55 y.o. female in NAD Lungs: Breathing comfortably on room air Skin: warm and dry Extremities: No edema  Diabetic Foot Check -  Appearance - no lesions, ulcers or calluses Skin - no unusual pallor or redness Monofilament testing - normal bilaterally  Right - Great toe, medial, central, lateral ball and posterior foot intact Left - Great toe, medial, central, lateral ball and posterior foot intact   Results for orders placed or performed in visit on 12/13/20 (from the past 24 hour(s))  HgB A1c     Status: Abnormal   Collection Time: 12/13/20  3:41 PM  Result Value Ref Range   Hemoglobin A1C 8.2 (A) 4.0 - 5.6 %   HbA1c POC (<> result, manual entry)     HbA1c, POC  (prediabetic range)     HbA1c, POC (controlled diabetic range)       ASSESSMENT/PLAN:   Essential hypertension Blood pressures okay, could consider increasing her Coreg at her next visit if pulse will tolerate.  Could also consider increasing valsartan if heart rate does not tolerate increasing Coreg.  Will obtain a BMP today.  She will follow-up in 3 months.  DM2 (diabetes mellitus, type 2) A1c improved from 9-8.2, patient was congratulated on this and was very excited.  She is going to continue to work on her diet and has a goal for herself of getting her A1c around 7.  Agree with this goal.  Ultimately would like to get her on an SGLT2 inhibitor given her HFrEF, but cost has been an issue.  We will send a message to our clinical pharmacist to see if she will qualify for any medication cost assistance.  Ultimately would like to get her off of glimepiride if possible.  For now, we will continue with her current regimen.  She will follow-up in 3 months.  She will be due for lipid panel in July.  She is on an ARB and statin.  Foot exam performed today.  Chronic systolic heart failure (HCC) Chronic, stable.  No signs of fluid overload.  Will obtain  a BMP today.  She is overdue for an echo, but cost has been an issue.  We will try to work on this.   Patient had a Cologuard test performed in September which appears to have been collected, but never received results.  We will message lab to see if there is any way to request them from the company.   Grace Ferrell, [place]

## 2020-12-13 ENCOUNTER — Encounter: Payer: Self-pay | Admitting: Family Medicine

## 2020-12-13 ENCOUNTER — Ambulatory Visit (INDEPENDENT_AMBULATORY_CARE_PROVIDER_SITE_OTHER): Payer: Self-pay | Admitting: Family Medicine

## 2020-12-13 ENCOUNTER — Other Ambulatory Visit: Payer: Self-pay

## 2020-12-13 VITALS — BP 144/90 | HR 69 | Ht 62.0 in | Wt 160.5 lb

## 2020-12-13 DIAGNOSIS — I5022 Chronic systolic (congestive) heart failure: Secondary | ICD-10-CM

## 2020-12-13 DIAGNOSIS — I10 Essential (primary) hypertension: Secondary | ICD-10-CM

## 2020-12-13 DIAGNOSIS — E119 Type 2 diabetes mellitus without complications: Secondary | ICD-10-CM

## 2020-12-13 LAB — POCT GLYCOSYLATED HEMOGLOBIN (HGB A1C): Hemoglobin A1C: 8.2 % — AB (ref 4.0–5.6)

## 2020-12-13 NOTE — Assessment & Plan Note (Signed)
A1c improved from 9-8.2, patient was congratulated on this and was very excited.  She is going to continue to work on her diet and has a goal for herself of getting her A1c around 7.  Agree with this goal.  Ultimately would like to get her on an SGLT2 inhibitor given her HFrEF, but cost has been an issue.  We will send a message to our clinical pharmacist to see if she will qualify for any medication cost assistance.  Ultimately would like to get her off of glimepiride if possible.  For now, we will continue with her current regimen.  She will follow-up in 3 months.  She will be due for lipid panel in July.  She is on an ARB and statin.  Foot exam performed today.

## 2020-12-13 NOTE — Assessment & Plan Note (Signed)
Blood pressures okay, could consider increasing her Coreg at her next visit if pulse will tolerate.  Could also consider increasing valsartan if heart rate does not tolerate increasing Coreg.  Will obtain a BMP today.  She will follow-up in 3 months.

## 2020-12-13 NOTE — Patient Instructions (Signed)
Thank you for coming to see me today. It was a pleasure. Today we talked about:   We will get some labs today.  If they are abnormal or we need to do something about them, I will call you.  If they are normal, I will send you a message on MyChart (if it is active) or a letter in the mail.  If you don't hear from [place] in 2 weeks, please call the office at the number below.  Please follow-up with me in 3 months for another A1c.  If you have any questions or concerns, please do not hesitate to call the office at [phone].  Best,   Luis Abed, DO

## 2020-12-13 NOTE — Assessment & Plan Note (Signed)
Chronic, stable.  No signs of fluid overload.  Will obtain a BMP today.  She is overdue for an echo, but cost has been an issue.  We will try to work on this.

## 2020-12-14 ENCOUNTER — Telehealth: Payer: Self-pay | Admitting: Family Medicine

## 2020-12-14 ENCOUNTER — Other Ambulatory Visit: Payer: Self-pay | Admitting: Family Medicine

## 2020-12-14 DIAGNOSIS — E876 Hypokalemia: Secondary | ICD-10-CM

## 2020-12-14 LAB — BASIC METABOLIC PANEL
BUN/Creatinine Ratio: 13 (ref 9–23)
BUN: 11 mg/dL (ref 6–24)
CO2: 23 mmol/L (ref 20–29)
Calcium: 9.8 mg/dL (ref 8.7–10.2)
Chloride: 102 mmol/L (ref 96–106)
Creatinine, Ser: 0.87 mg/dL (ref 0.57–1.00)
GFR calc Af Amer: 86 mL/min/{1.73_m2} (ref >59)
GFR calc non Af Amer: 74 mL/min/{1.73_m2} (ref >59)
Glucose: 107 mg/dL — ABNORMAL HIGH (ref 65–99)
Potassium: 3.3 mmol/L — ABNORMAL LOW (ref 3.5–5.2)
Sodium: 144 mmol/L (ref 134–144)

## 2020-12-14 MED ORDER — POTASSIUM CHLORIDE ER 20 MEQ PO TBCR
10.0000 meq | EXTENDED_RELEASE_TABLET | Freq: Two times a day (BID) | ORAL | 0 refills | Status: DC
Start: 2020-12-14 — End: 2022-04-08

## 2020-12-14 NOTE — Progress Notes (Signed)
Low K, will send K-dur and repeat BMP next week.  Given that she takes lasix, may need to have her take daily K

## 2020-12-14 NOTE — Telephone Encounter (Signed)
Call patient, no answer, left voicemail.  Had spoken with pharmacy team and patient would qualify for medication assistance in regards to an SGLT2 inhibitor which would greatly help with both diabetes and her heart failure.  Advised her that they will contact her in regards to starting this.  Also discussed that she will need a BMP 2 weeks after initiation and should come back to see me in 1 month after initiation if she is okay with pursuing this.

## 2020-12-19 ENCOUNTER — Telehealth: Payer: Self-pay | Admitting: Family Medicine

## 2020-12-19 ENCOUNTER — Telehealth: Payer: Self-pay

## 2020-12-19 NOTE — Telephone Encounter (Signed)
Pt returned phone call.   She was somewhat lost on what medication I was talking about (jardiance). She said the doctor did not discuss anything with her when she was here. I told her there was a note in her chart from where her provider attempted to reach out and leave a voicemail about the medication plan she wanted to try. She responded "yea I have a few messages"... & then continued to say, "oh, no I'm not taking that". I told her she needed to reach back out to her provider and discuss what she needs to do. If she needs further assistance getting the medication I told her feel free to call me back at the same number [phone])

## 2020-12-19 NOTE — Telephone Encounter (Signed)
error 

## 2020-12-19 NOTE — Telephone Encounter (Signed)
Left voicemail regarding patient assistance for one of her medications (jardiance). Wanted to move forward with getting her to sign an application & provide necessary documents.  Gave her my call back number [phone]
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
## 2020-12-21 ENCOUNTER — Encounter: Payer: Self-pay | Admitting: Family Medicine

## 2020-12-21 ENCOUNTER — Other Ambulatory Visit: Payer: Self-pay

## 2020-12-21 ENCOUNTER — Ambulatory Visit (INDEPENDENT_AMBULATORY_CARE_PROVIDER_SITE_OTHER): Payer: Self-pay | Admitting: Family Medicine

## 2020-12-21 VITALS — BP 142/64 | HR 66 | Ht 62.0 in | Wt 159.4 lb

## 2020-12-21 DIAGNOSIS — I10 Essential (primary) hypertension: Secondary | ICD-10-CM

## 2020-12-21 DIAGNOSIS — E876 Hypokalemia: Secondary | ICD-10-CM

## 2020-12-21 NOTE — Progress Notes (Signed)
    SUBJECTIVE:   CHIEF COMPLAINT / HPI:   Hypokalemia: Patient states she has been taking her medication daily since receiving it.  Takes half a tablet twice a day.  Also compliant with her other medications.  Wants to know if this is something she needs to be taking forever.  PERTINENT  PMH / PSH: HTN  OBJECTIVE:   BP (!) 142/64   Pulse 66   Ht 5\' 2"  (1.575 m)   Wt 159 lb 6.4 oz (72.3 kg)   SpO2 100%   BMI 29.15 kg/m   General: Alert, oriented.  No acute distress. CV: Regular rate and rhythm, Pulmonary: Lungs clear to auscultation bilaterally Psych: Pleasant affect, spontaneous speech, makes eye contact.  ASSESSMENT/PLAN:   Hypokalemia Mild.  Patient was started on potassium supplementation and is here for recheck.  Will get BMP today.  Advised patient to follow-up with PCP regarding the duration of treatment for hypokalemia which is most likely secondary to her Lasix use.     , MD [facility]

## 2020-12-21 NOTE — Patient Instructions (Signed)
It was nice to meet you today,  I will call you with the results of the test when I get them.  At that point we can discuss whether you should adjust your potassium medication.  Please follow-up with your PCP.  Have a great day,  Frederic Jericho, MD

## 2020-12-22 ENCOUNTER — Other Ambulatory Visit: Payer: Self-pay | Admitting: *Deleted

## 2020-12-22 DIAGNOSIS — E876 Hypokalemia: Secondary | ICD-10-CM

## 2020-12-22 LAB — BASIC METABOLIC PANEL
BUN/Creatinine Ratio: 9 (ref 9–23)
BUN: 7 mg/dL (ref 6–24)
CO2: 22 mmol/L (ref 20–29)
Calcium: 10.1 mg/dL (ref 8.7–10.2)
Chloride: 101 mmol/L (ref 96–106)
Creatinine, Ser: 0.79 mg/dL (ref 0.57–1.00)
Glucose: 136 mg/dL — ABNORMAL HIGH (ref 65–99)
Potassium: 4.1 mmol/L (ref 3.5–5.2)
Sodium: 141 mmol/L (ref 134–144)
eGFR: 87 mL/min/{1.73_m2} (ref >59)

## 2020-12-22 NOTE — Telephone Encounter (Signed)
K WNL, none needed at this time.

## 2020-12-22 NOTE — Assessment & Plan Note (Signed)
Mild.  Patient was started on potassium supplementation and is here for recheck.  Will get BMP today.  Advised patient to follow-up with PCP regarding the duration of treatment for hypokalemia which is most likely secondary to her Lasix use.

## 2020-12-27 ENCOUNTER — Other Ambulatory Visit: Payer: Self-pay

## 2020-12-27 DIAGNOSIS — E876 Hypokalemia: Secondary | ICD-10-CM

## 2021-01-01 ENCOUNTER — Telehealth: Payer: Self-pay | Admitting: *Deleted

## 2021-01-01 NOTE — Telephone Encounter (Signed)
Contacted patient to let her know that she did not need any potassium at this time because she was WNL on her labs and also notified the pharmacy that she did not need a refill on her medication at this time. Jdyn Parkerson Zimmerman Rumple, CMA

## 2021-01-22 ENCOUNTER — Other Ambulatory Visit: Payer: Self-pay | Admitting: *Deleted

## 2021-01-22 DIAGNOSIS — I10 Essential (primary) hypertension: Secondary | ICD-10-CM

## 2021-01-22 DIAGNOSIS — I5022 Chronic systolic (congestive) heart failure: Secondary | ICD-10-CM

## 2021-01-22 MED ORDER — CARVEDILOL 6.25 MG PO TABS: 6.2500 mg | ORAL_TABLET | Freq: Two times a day (BID) | ORAL | 2 refills | Status: DC

## 2021-01-22 MED ORDER — ATORVASTATIN CALCIUM 40 MG PO TABS: 1.0000 | ORAL_TABLET | Freq: Every day | ORAL | 10 refills | Status: DC

## 2021-01-26 ENCOUNTER — Other Ambulatory Visit: Payer: Self-pay

## 2021-01-26 DIAGNOSIS — E119 Type 2 diabetes mellitus without complications: Secondary | ICD-10-CM

## 2021-01-26 MED ORDER — METFORMIN HCL 1000 MG PO TABS
1000.0000 mg | ORAL_TABLET | Freq: Two times a day (BID) | ORAL | 3 refills | Status: DC
Start: 2021-01-26 — End: 2021-05-01

## 2021-01-29 ENCOUNTER — Other Ambulatory Visit: Payer: Self-pay

## 2021-01-29 DIAGNOSIS — E119 Type 2 diabetes mellitus without complications: Secondary | ICD-10-CM

## 2021-01-29 MED ORDER — GLIMEPIRIDE 4 MG PO TABS
ORAL_TABLET | ORAL | 0 refills | Status: DC
Start: 2021-01-29 — End: 2021-02-22

## 2021-02-22 ENCOUNTER — Other Ambulatory Visit: Payer: Self-pay

## 2021-02-22 DIAGNOSIS — E119 Type 2 diabetes mellitus without complications: Secondary | ICD-10-CM

## 2021-02-22 MED ORDER — GLIMEPIRIDE 4 MG PO TABS
ORAL_TABLET | ORAL | 0 refills | Status: DC
Start: 2021-02-22 — End: 2021-04-30

## 2021-03-31 ENCOUNTER — Encounter (HOSPITAL_COMMUNITY): Payer: Self-pay

## 2021-03-31 ENCOUNTER — Ambulatory Visit
Admission: EM | Admit: 2021-03-31 | Discharge: 2021-03-31 | Disposition: A | Discharge status: Home / Self Care | Payer: No Typology Code available for payment source

## 2021-03-31 ENCOUNTER — Telehealth: Payer: Self-pay | Admitting: Family Medicine

## 2021-03-31 ENCOUNTER — Emergency Department (HOSPITAL_COMMUNITY)
Admission: EM | Admit: 2021-03-31 | Discharge: 2021-03-31 | Disposition: A | Discharge status: Home / Self Care | Payer: No Typology Code available for payment source | Attending: Emergency Medicine | Admitting: Emergency Medicine

## 2021-03-31 ENCOUNTER — Other Ambulatory Visit: Payer: Self-pay

## 2021-03-31 DIAGNOSIS — Z2914 Encounter for prophylactic rabies immune globin: Secondary | ICD-10-CM | POA: Insufficient documentation

## 2021-03-31 DIAGNOSIS — Z23 Encounter for immunization: Secondary | ICD-10-CM | POA: Insufficient documentation

## 2021-03-31 DIAGNOSIS — F17210 Nicotine dependence, cigarettes, uncomplicated: Secondary | ICD-10-CM | POA: Insufficient documentation

## 2021-03-31 DIAGNOSIS — W540XXA Bitten by dog, initial encounter: Secondary | ICD-10-CM | POA: Insufficient documentation

## 2021-03-31 DIAGNOSIS — Z79899 Other long term (current) drug therapy: Secondary | ICD-10-CM | POA: Insufficient documentation

## 2021-03-31 DIAGNOSIS — Z203 Contact with and (suspected) exposure to rabies: Secondary | ICD-10-CM | POA: Insufficient documentation

## 2021-03-31 DIAGNOSIS — I10 Essential (primary) hypertension: Secondary | ICD-10-CM | POA: Insufficient documentation

## 2021-03-31 DIAGNOSIS — E119 Type 2 diabetes mellitus without complications: Secondary | ICD-10-CM | POA: Insufficient documentation

## 2021-03-31 DIAGNOSIS — Z7984 Long term (current) use of oral hypoglycemic drugs: Secondary | ICD-10-CM | POA: Insufficient documentation

## 2021-03-31 DIAGNOSIS — S71111A Laceration without foreign body, right thigh, initial encounter: Secondary | ICD-10-CM | POA: Insufficient documentation

## 2021-03-31 MED ORDER — AMOXICILLIN-POT CLAVULANATE 875-125 MG PO TABS: 1.0000 | ORAL_TABLET | Freq: Two times a day (BID) | ORAL | 0 refills | Status: AC

## 2021-03-31 MED ORDER — TETANUS-DIPHTH-ACELL PERTUSSIS 5-2.5-18.5 LF-MCG/0.5 IM SUSY
0.5000 mL | PREFILLED_SYRINGE | Freq: Once | INTRAMUSCULAR | Status: AC
  Administered 2021-03-31: 0.5 mL via INTRAMUSCULAR
  Filled 2021-03-31: qty 0.5

## 2021-03-31 MED ORDER — RABIES VACCINE, PCEC IM SUSR
1.0000 mL | Freq: Once | INTRAMUSCULAR | Status: AC
  Administered 2021-03-31: 1 mL via INTRAMUSCULAR
  Filled 2021-03-31: qty 1

## 2021-03-31 MED ORDER — RABIES IMMUNE GLOBULIN 150 UNIT/ML IM INJ
20.0000 [IU]/kg | INJECTION | Freq: Once | INTRAMUSCULAR | Status: AC
  Administered 2021-03-31: 1425 [IU] via INTRAMUSCULAR
  Filled 2021-03-31: qty 10

## 2021-03-31 NOTE — ED Provider Notes (Signed)
[facility]-EMERGENCY DEPT Provider Note   CSN: [id number] Arrival date & time: 03/31/21  1419     History Chief Complaint  Patient presents with   Animal Bite    Grace Ferrell is a 55 y.o. female.  HPI Patient is a 55 year old female with a medical history as noted below.  She presents to the emergency department due to a dog bite.  Patient states earlier today she was walking to her apartment and a medium sized dog ran up to her and bit her right thigh.  She has 2 small abrasions to the right thigh with an additional small laceration.  No active bleeding at this time.  She reports moderate stinging pain in the region.  She is unsure of who the owner of the dog is.  She is unsure of the dog is up-to-date on its vaccinations.  She states that her Tdap is not up-to-date.  Denies any other complaints or regions of pain.    Past Medical History:  Diagnosis Date   Diabetes mellitus    Hypercholesteremia    Hypertension     Patient Active Problem List   Diagnosis Date Noted   Hypokalemia 12/22/2020   Chronic systolic heart failure (HCC) 10/26/2015   Moderate mitral regurgitation by prior echocardiogram 10/21/2014   Moderate aortic regurgitation 10/21/2014   Essential hypertension    Mixed hyperlipidemia due to type 2 diabetes mellitus (HCC) 03/12/2012   TOBACCO USER 07/29/2009   DM2 (diabetes mellitus, type 2) (HCC) 12/18/2006    Past Surgical History:  Procedure Laterality Date   ENDOMETRIAL ABLATION     KNEE SURGERY     TUBAL LIGATION       OB History     Gravida  3   Para  3   Term  3   Preterm      AB      Living  3      SAB      IAB      Ectopic      Multiple      Live Births  3           Family History  Problem Relation Age of Onset   Stroke Mother    Hypertension Sister    Diabetes Sister    Breast cancer Sister    Hypertension Brother    Breast cancer Maternal Grandmother    Hypertension Brother    Hypertension  Sister    Stroke Sister    Hypertension Sister     Social History   Tobacco Use   Smoking status: Every Day    Packs/day: 0.50    Years: 21.00    Pack years: 10.50    Types: Cigarettes   Smokeless tobacco: Never  Substance Use Topics   Alcohol use: No    Alcohol/week: 0.0 standard drinks   Drug use: No    Home Medications Prior to Admission medications   Medication Sig Start Date End Date Taking? Authorizing Provider  amoxicillin-clavulanate (AUGMENTIN) 875-125 MG tablet Take 1 tablet by mouth 2 (two) times daily for 7 days. 03/31/21 04/07/21 Yes Placido Sou, PA-C  atorvastatin (LIPITOR) 40 MG tablet Take 1 tablet (40 mg total) by mouth daily. 01/22/21   Meccariello, Solmon Ice, DO  carvedilol (COREG) 6.25 MG tablet Take 1 tablet (6.25 mg total) by mouth 2 (two) times daily with a meal. 01/22/21   Meccariello, Solmon Ice, DO  cetirizine (ZYRTEC) 10 MG tablet Take 1 tablet (10 mg  total) by mouth daily. 05/04/20   Meccariello, Solmon Ice, DO  fluticasone (FLONASE) 50 MCG/ACT nasal spray Place 2 sprays into both nostrils daily. 05/04/20   Meccariello, Solmon Ice, DO  furosemide (LASIX) 40 MG tablet Take 1 tablet (40 mg total) by mouth daily. 10/17/20   Meccariello, Solmon Ice, DO  glimepiride (AMARYL) 4 MG tablet 1 tablet daily 02/22/21   Meccariello, Solmon Ice, DO  loratadine (CLARITIN) 10 MG tablet Take 1 tablet (10 mg total) by mouth daily. 11/27/20   Meccariello, Solmon Ice, DO  metFORMIN (GLUCOPHAGE) 1000 MG tablet Take 1 tablet (1,000 mg total) by mouth 2 (two) times daily with a meal. 01/26/21   Allayne Stack, DO  Potassium Chloride ER 20 MEQ TBCR Take 10 mEq by mouth 2 (two) times daily for 3 days. 12/14/20 12/17/20  Meccariello, Solmon Ice, DO  valsartan (DIOVAN) 40 MG tablet Take 1 tablet (40 mg total) by mouth daily. 11/28/20   Meccariello, Solmon Ice, DO    Allergies    Tessalon perles [benzonatate], Codeine, Darvocet [propoxyphene n-acetaminophen], Enalapril, Morphine and related,  Sulfamethoxazole-trimethoprim, and Vicodin [hydrocodone-acetaminophen]  Review of Systems   Review of Systems  Musculoskeletal:  Positive for myalgias.  Skin:  Positive for wound.  Neurological:  Negative for weakness and numbness.   Physical Exam Updated Vital Signs BP (!) 148/71 (BP Location: Right Arm)   Pulse 77   Temp 98.6 F (37 C) (Oral)   Resp 16   Ht 5\' 2"  (1.575 m)   Wt 73 kg   SpO2 100%   BMI 29.45 kg/m   Physical Exam Vitals and nursing note reviewed.  Constitutional:      General: She is not in acute distress.    Appearance: She is well-developed.  HENT:     Head: Normocephalic and atraumatic.     Right Ear: External ear normal.     Left Ear: External ear normal.  Eyes:     General: No scleral icterus.       Right eye: No discharge.        Left eye: No discharge.     Conjunctiva/sclera: Conjunctivae normal.  Neck:     Trachea: No tracheal deviation.  Cardiovascular:     Rate and Rhythm: Normal rate.  Pulmonary:     Effort: Pulmonary effort is normal. No respiratory distress.     Breath sounds: No stridor.  Abdominal:     General: There is no distension.  Musculoskeletal:        General: No swelling or deformity.     Cervical back: Neck supple.  Skin:    General: Skin is warm and dry.     Findings: No rash.     Comments: Two 3 to 4 inch abrasions to the right thigh.  Additional small well approximated laceration abutting the abrasions.  No active bleeding.  Wound is healing well.  No surrounding erythema.  Neurological:     Mental Status: She is alert.     Cranial Nerves: Cranial nerve deficit: no gross deficits.    ED Results / Procedures / Treatments   Labs (all labs ordered are listed, but only abnormal results are displayed) Labs Reviewed - No data to display  EKG None  Radiology No results found.  Procedures Procedures   Medications Ordered in ED Medications  rabies immune globulin (HYPERAB/KEDRAB) injection 1,425 Units (has no  administration in time range)  rabies vaccine (RABAVERT) injection 1 mL (has no administration in time range)  Tdap (BOOSTRIX)  injection 0.5 mL (has no administration in time range)    ED Course  I have reviewed the triage vital signs and the nursing notes.  Pertinent labs & imaging results that were available during my care of the patient were reviewed by me and considered in my medical decision making (see chart for details).    MDM Rules/Calculators/A&P                          Patient is a 55 year old female who presents to the emergency department due to a dog bite that occurred just prior to arrival.  Patient is unsure of who the owner of the dog is or how to locate the dog.  She is unsure of the dog's vaccination status.  Due to this, I recommended rabies vaccination.  She was amenable with this.  She was given the rabies vaccine in the emergency department as well as the rabies immunoglobulin.  Her Tdap is not up-to-date so this was updated in the emergency department as well.  Will discharge on a course of Augmentin.  She was given instructions for the remainder of her vaccinations that are needed for rabies.  Urged patient to work to find the owner of the pet.  She knows that if she develops any new or worsening symptoms, signs of infection, or has difficulty obtaining her further rabies vaccinations that she needs to come back to the emergency department for reevaluation.  Her questions were answered and she was amicable at the time of discharge.  Final Clinical Impression(s) / ED Diagnoses Final diagnoses:  Need for rabies vaccination  Dog bite, initial encounter    Rx / DC Orders ED Discharge Orders          Ordered    amoxicillin-clavulanate (AUGMENTIN) 875-125 MG tablet  2 times daily        03/31/21 1512             Placido Sou, PA-C 03/31/21 1536    Tilden Fossa, MD 04/01/21 1831

## 2021-03-31 NOTE — ED Triage Notes (Signed)
Pt reports dog bite to right thigh. Pt unsure if dog has rabies vaccines.

## 2021-03-31 NOTE — ED Notes (Signed)
Pt denied wanting to notify animal control.

## 2021-03-31 NOTE — Discharge Instructions (Addendum)
                                  RABIES VACCINE FOLLOW UP  Patient's Name: Grace Ferrell                     Original Order Date:03/31/2021  Medical Record Number: [id number]  ED Physician: Tilden Fossa, MD Primary Diagnosis: Rabies Exposure       PCP: Unknown Jim, DO  Patient Phone Number: (home) [phone] (home)    (cell)  Telephone Information:  Mobile [phone]    (work) There is no work phone number on file. Species of Animal:     You have been seen in the Emergency Department for a possible rabies exposure. It's very important you return for the additional vaccine doses.  Please call the clinic listed below for hours of operation.   Clinic that will administer your rabies vaccines:    DAY 0:  03/31/2021      DAY 3:  04/03/2021       DAY 7:  04/07/2021     DAY 14:  04/14/2021         The 5th vaccine injection is considered for immune compromised patients only.  DAY 28:  04/28/2021      Your Vaccine Injections will be given at the Urgent Care Center [facility]) at [facility].  The Urgent [facility] is open from 8:00AM-9:00PM Monday thru Friday and 10:00AM-9:00PM on Saturdays and Sundays.  Please call [phone] Urgent Care Center, if you have any problems with making your scheduled appointments.  This will assure you prompt attention on your arrival and allow [place] to be prepared for your return visit.  There will be a minimal fee for the injection that will be billed to your insurance company along with the charge for the vaccine.  I have also prescribed you an antibiotic called Augmentin.  Please take this twice a day for the next 7 days to help prevent infection in your leg.  Do not stop taking this antibiotic early.  If you develop any new or worsening symptoms or have difficulty obtaining your vaccinations based on the schedule above, please come back to the emergency department.  It was a pleasure to meet you.

## 2021-03-31 NOTE — Telephone Encounter (Signed)
**  After Hours/ Emergency Line Call**  Received a page to call Grace Ferrell.  Pt states she was bit by a someone else's dog. Swelling and bruising. She states the small dog is unknown to her.   Endorsing "stinging" pain.  Recommended urgent medical attention. She is unsure of her Tetanus vaccination.  Red flags discussed.  Urgent care/ED evaluation recommended likely will need antibiotics, possible vaccination(s) [Rabies, Tetanus]. Per chart review, last Td in 2007.  Will forward to PCP.  Katha Cabal, DO PGY-2, [facility] 03/31/2021 11:08 AM

## 2021-04-01 ENCOUNTER — Ambulatory Visit: Payer: No Typology Code available for payment source

## 2021-04-03 ENCOUNTER — Other Ambulatory Visit: Payer: Self-pay

## 2021-04-03 ENCOUNTER — Ambulatory Visit (HOSPITAL_COMMUNITY)
Admission: RE | Admit: 2021-04-03 | Discharge: 2021-04-03 | Disposition: A | Discharge status: Home / Self Care | Payer: No Typology Code available for payment source | Source: Ambulatory Visit | Attending: Internal Medicine | Admitting: Internal Medicine

## 2021-04-03 DIAGNOSIS — Z203 Contact with and (suspected) exposure to rabies: Secondary | ICD-10-CM

## 2021-04-03 MED ORDER — RABIES VACCINE, PCEC IM SUSR
INTRAMUSCULAR | Status: AC
  Filled 2021-04-03: qty 1

## 2021-04-03 MED ORDER — RABIES VACCINE, PCEC IM SUSR
1.0000 mL | Freq: Once | INTRAMUSCULAR | Status: AC
  Administered 2021-04-03: 1 mL via INTRAMUSCULAR

## 2021-04-03 NOTE — ED Triage Notes (Signed)
Patient presents for second vaccine in the rabies series, day 3

## 2021-04-07 ENCOUNTER — Ambulatory Visit: Payer: No Typology Code available for payment source

## 2021-04-07 ENCOUNTER — Ambulatory Visit (HOSPITAL_COMMUNITY)
Admission: EM | Admit: 2021-04-07 | Discharge: 2021-04-07 | Disposition: A | Discharge status: Home / Self Care | Payer: Self-pay | Attending: Internal Medicine | Admitting: Internal Medicine

## 2021-04-07 DIAGNOSIS — Z203 Contact with and (suspected) exposure to rabies: Secondary | ICD-10-CM

## 2021-04-07 MED ORDER — RABIES VACCINE, PCEC IM SUSR
INTRAMUSCULAR | Status: AC
  Filled 2021-04-07: qty 1

## 2021-04-07 MED ORDER — RABIES VACCINE, PCEC IM SUSR
1.0000 mL | Freq: Once | INTRAMUSCULAR | Status: AC
  Administered 2021-04-07: 1 mL via INTRAMUSCULAR

## 2021-04-07 NOTE — ED Triage Notes (Signed)
Patient in for 3rd dose of rabies vaccine

## 2021-04-14 ENCOUNTER — Ambulatory Visit (HOSPITAL_COMMUNITY)
Admission: EM | Admit: 2021-04-14 | Discharge: 2021-04-14 | Disposition: A | Discharge status: Home / Self Care | Payer: No Typology Code available for payment source | Attending: Internal Medicine | Admitting: Internal Medicine

## 2021-04-14 DIAGNOSIS — Z203 Contact with and (suspected) exposure to rabies: Secondary | ICD-10-CM

## 2021-04-14 MED ORDER — RABIES VACCINE, PCEC IM SUSR
1.0000 mL | Freq: Once | INTRAMUSCULAR | Status: AC
  Administered 2021-04-14: 1 mL via INTRAMUSCULAR

## 2021-04-14 MED ORDER — RABIES VACCINE, PCEC IM SUSR
INTRAMUSCULAR | Status: AC
  Filled 2021-04-14: qty 1

## 2021-04-14 NOTE — ED Triage Notes (Signed)
Pt is present today for her fourth rabies vaccine. Pt denies any pain or discomfort.

## 2021-04-30 ENCOUNTER — Other Ambulatory Visit: Payer: Self-pay

## 2021-04-30 DIAGNOSIS — E119 Type 2 diabetes mellitus without complications: Secondary | ICD-10-CM

## 2021-04-30 NOTE — Telephone Encounter (Signed)
Request from pharmacy is for 2 MG tabs but the order in the chart is for 4 MG tabs. Sunday Spillers, CMA

## 2021-05-01 ENCOUNTER — Telehealth: Payer: No Typology Code available for payment source | Admitting: Physician Assistant

## 2021-05-01 ENCOUNTER — Other Ambulatory Visit: Payer: Self-pay

## 2021-05-01 DIAGNOSIS — I5022 Chronic systolic (congestive) heart failure: Secondary | ICD-10-CM

## 2021-05-01 DIAGNOSIS — B9689 Other specified bacterial agents as the cause of diseases classified elsewhere: Secondary | ICD-10-CM

## 2021-05-01 DIAGNOSIS — E119 Type 2 diabetes mellitus without complications: Secondary | ICD-10-CM

## 2021-05-01 DIAGNOSIS — I10 Essential (primary) hypertension: Secondary | ICD-10-CM

## 2021-05-01 DIAGNOSIS — J069 Acute upper respiratory infection, unspecified: Secondary | ICD-10-CM

## 2021-05-01 MED ORDER — VALSARTAN 40 MG PO TABS: 40.0000 mg | ORAL_TABLET | Freq: Every day | ORAL | 0 refills | Status: DC

## 2021-05-01 MED ORDER — METFORMIN HCL 1000 MG PO TABS: 1000.0000 mg | ORAL_TABLET | Freq: Two times a day (BID) | ORAL | 3 refills | Status: DC

## 2021-05-01 MED ORDER — GLIMEPIRIDE 4 MG PO TABS: ORAL_TABLET | ORAL | 0 refills | Status: DC

## 2021-05-01 MED ORDER — AMOXICILLIN-POT CLAVULANATE 875-125 MG PO TABS: 1.0000 | ORAL_TABLET | Freq: Two times a day (BID) | ORAL | 0 refills | Status: DC

## 2021-05-01 NOTE — Progress Notes (Signed)
I have spent 5 minutes in review of e-visit questionnaire, review and updating patient chart, medical decision making and response to patient.   Matalie Romberger Cody Nadirah Socorro, PA-C    

## 2021-05-01 NOTE — Telephone Encounter (Signed)
Per most recent visit with Dr. Obie Dredge, patient taking 4mg  Glimepiride daily. Refills provided for 4mg  tablets.

## 2021-05-01 NOTE — Progress Notes (Signed)
We are sorry that you are not feeling well.  Here is how we plan to help!  Based on your presentation I believe you most likely have A cough due to bacteria.  When patients have a fever and a productive cough with a change in color or increased sputum production, we are concerned about bacterial bronchitis.  If left untreated it can progress to pneumonia.  If your symptoms do not improve with your treatment plan it is important that you contact your provider.   I have prescribed Augmentin twice daily for 7 days.    In addition you may use A non-prescription cough medication called Mucinex DM: take 2 tablets every 12 hours.   From your responses in the eVisit questionnaire you describe inflammation in the upper respiratory tract which is causing a significant cough.  This is commonly called Bronchitis and has four common causes:   Allergies Viral Infections Acid Reflux Bacterial Infection Allergies, viruses and acid reflux are treated by controlling symptoms or eliminating the cause. An example might be a cough caused by taking certain blood pressure medications. You stop the cough by changing the medication. Another example might be a cough caused by acid reflux. Controlling the reflux helps control the cough.  USE OF BRONCHODILATOR ("RESCUE") INHALERS: There is a risk from using your bronchodilator too frequently.  The risk is that over-reliance on a medication which only relaxes the muscles surrounding the breathing tubes can reduce the effectiveness of medications prescribed to reduce swelling and congestion of the tubes themselves.  Although you feel brief relief from the bronchodilator inhaler, your asthma may actually be worsening with the tubes becoming more swollen and filled with mucus.  This can delay other crucial treatments, such as oral steroid medications. If you need to use a bronchodilator inhaler daily, several times per day, you should discuss this with your provider.  There are  probably better treatments that could be used to keep your asthma under control.     HOME CARE Only take medications as instructed by your medical team. Complete the entire course of an antibiotic. Drink plenty of fluids and get plenty of rest. Avoid close contacts especially the very young and the elderly Cover your mouth if you cough or cough into your sleeve. Always remember to wash your hands A steam or ultrasonic humidifier can help congestion.   GET HELP RIGHT AWAY IF: You develop worsening fever. You become short of breath You cough up blood. Your symptoms persist after you have completed your treatment plan MAKE SURE YOU  Understand these instructions. Will watch your condition. Will get help right away if you are not doing well or get worse.    Thank you for choosing an e-visit.  Your e-visit answers were reviewed by a board certified advanced clinical practitioner to complete your personal care plan. Depending upon the condition, your plan could have included both over the counter or prescription medications.  Please review your pharmacy choice. Make sure the pharmacy is open so you can pick up prescription now. If there is a problem, you may contact your provider through [employer] and have the prescription routed to another pharmacy.  Your safety is important to [place]. If you have drug allergies check your prescription carefully.   For the next 24 hours you can use MyChart to ask questions about today's visit, request a non-urgent call back, or ask for a work or school excuse. You will get an email in the next two days asking  about your experience. I hope that your e-visit has been valuable and will speed your recovery.

## 2021-06-01 LAB — HM DIABETES EYE EXAM

## 2021-06-18 ENCOUNTER — Other Ambulatory Visit: Payer: Self-pay

## 2021-06-18 MED ORDER — CETIRIZINE HCL 10 MG PO TABS: 10.0000 mg | ORAL_TABLET | Freq: Every day | ORAL | 11 refills | Status: DC

## 2021-07-03 ENCOUNTER — Other Ambulatory Visit: Payer: Self-pay | Admitting: Obstetrics and Gynecology

## 2021-07-03 DIAGNOSIS — Z1231 Encounter for screening mammogram for malignant neoplasm of breast: Secondary | ICD-10-CM

## 2021-07-05 ENCOUNTER — Other Ambulatory Visit: Payer: Self-pay | Admitting: Obstetrics and Gynecology

## 2021-07-05 DIAGNOSIS — Z1231 Encounter for screening mammogram for malignant neoplasm of breast: Secondary | ICD-10-CM

## 2021-07-24 ENCOUNTER — Ambulatory Visit: Payer: No Typology Code available for payment source | Admitting: Family Medicine

## 2021-07-28 ENCOUNTER — Other Ambulatory Visit: Payer: Self-pay | Admitting: Family Medicine

## 2021-07-28 DIAGNOSIS — I10 Essential (primary) hypertension: Secondary | ICD-10-CM

## 2021-07-28 DIAGNOSIS — E119 Type 2 diabetes mellitus without complications: Secondary | ICD-10-CM

## 2021-07-28 DIAGNOSIS — I5022 Chronic systolic (congestive) heart failure: Secondary | ICD-10-CM

## 2021-08-06 ENCOUNTER — Other Ambulatory Visit: Payer: Self-pay | Admitting: *Deleted

## 2021-08-07 ENCOUNTER — Ambulatory Visit (INDEPENDENT_AMBULATORY_CARE_PROVIDER_SITE_OTHER): Payer: Self-pay | Admitting: Family Medicine

## 2021-08-07 ENCOUNTER — Other Ambulatory Visit: Payer: Self-pay

## 2021-08-07 ENCOUNTER — Encounter: Payer: Self-pay | Admitting: Family Medicine

## 2021-08-07 VITALS — BP 138/78 | HR 77 | Ht 62.0 in | Wt 164.8 lb

## 2021-08-07 DIAGNOSIS — E119 Type 2 diabetes mellitus without complications: Secondary | ICD-10-CM

## 2021-08-07 DIAGNOSIS — E1169 Type 2 diabetes mellitus with other specified complication: Secondary | ICD-10-CM

## 2021-08-07 DIAGNOSIS — E782 Mixed hyperlipidemia: Secondary | ICD-10-CM

## 2021-08-07 LAB — POCT GLYCOSYLATED HEMOGLOBIN (HGB A1C): HbA1c, POC (controlled diabetic range): 9.4 % — AB (ref 0.0–7.0)

## 2021-08-07 MED ORDER — GLIMEPIRIDE 4 MG PO TABS: 8.0000 mg | ORAL_TABLET | Freq: Every day | ORAL | 0 refills | Status: DC

## 2021-08-07 NOTE — Progress Notes (Signed)
    SUBJECTIVE:   CHIEF COMPLAINT / HPI:   Diabetes Follow-up Last A1c 8.2% on 12/13/2020 Home meds: metformin 1000mg  BID, glimepiride 4mg  daily She reports excellent medication compliance- forgets maybe 2 doses per month Does not check her sugar at home Denies symptoms of hypoglycemia Reports she is quite active and walks every day With regard to her diet, patient states she "struggles w/bread". Also feels limited in her dietary options due to finances (states she's never going to pay $5 for celery when she can pay $2 for bread, sausage, etc).   PERTINENT  PMH / PSH: T2DM, HTN, HFrEF, tobacco use  OBJECTIVE:   BP 138/78   Pulse 77   Ht 5\' 2"  (1.575 m)   Wt 164 lb 12.8 oz (74.8 kg)   SpO2 99%   BMI 30.14 kg/m   General: NAD, pleasant, able to participate in exam Cardiac: RRR, S1 S2 present. normal heart sounds Respiratory: CTAB, normal effort, No wheezes, rales or rhonchi Abdomen: soft, non-tender, non-distended Extremities: no edema or cyanosis. Skin: warm and dry, no rashes noted Neuro: alert, no obvious focal deficits Psych: Normal affect and mood   ASSESSMENT/PLAN:   DM2 (diabetes mellitus, type 2) (HCC) Not well controlled- A1c 9.4% today, which is increased from 8.2 previously (8 months ago). Patient's goal A1c is <7.0%. Somewhat limited in management options due to financial barriers (no insurance). Her dietary compliance also limited for this reason. -Continue Metformin 1000mg  BID -Increase Glimepiride to 8mg  daily -Continue ARB and statin -UTD on foot and eye exam -Obtain BMP today -Check updated lipid panel (in 04/2020 showed LDL 81)  Mixed hyperlipidemia due to type 2 diabetes mellitus Last lipid panel 05/04/2020 showed total cholesterol 137, HDL 38, LDL 81, Triglycerides 97. Remains on Atorvastatin 40mg  daily. -Obtain repeat lipid panel today  Health Maintenance Patient due for several health maintenance items. She will schedule separate appt to discuss  these   , MD [facility]

## 2021-08-07 NOTE — Patient Instructions (Addendum)
It was great to meet you!  Your A1c was 9.4% today, which means your diabetes is not well controlled and you're at risk of developing complications (heart disease, eye problems, kidney disease, amputations, etc).  Continue taking your Metformin twice daily. We are going to increase the dose of your glimepiride (amaryl) from 4mg  daily to 8mg  daily which means you should take 2 pills daily.  I would like you to see our pharmacy team in 1 month or so to discuss your diabetes medication further. Sometimes they have additional tools to reduce costs of medications. We will arrange this appointment for you.  We are checking some labs. We will send you a MyChart message with the results or call if they are abnormal.   Take care and seek immediate care sooner if you develop any concerns.  Dr. Family Medicine

## 2021-08-08 LAB — BASIC METABOLIC PANEL
BUN/Creatinine Ratio: 9 (ref 9–23)
BUN: 7 mg/dL (ref 6–24)
CO2: 26 mmol/L (ref 20–29)
Calcium: 9.7 mg/dL (ref 8.7–10.2)
Chloride: 99 mmol/L (ref 96–106)
Creatinine, Ser: 0.77 mg/dL (ref 0.57–1.00)
Glucose: 142 mg/dL — ABNORMAL HIGH (ref 70–99)
Potassium: 3.4 mmol/L — ABNORMAL LOW (ref 3.5–5.2)
Sodium: 139 mmol/L (ref 134–144)
eGFR: 90 mL/min/{1.73_m2} (ref >59)

## 2021-08-08 LAB — LIPID PANEL
Chol/HDL Ratio: 4.2 ratio (ref 0.0–4.4)
Cholesterol, Total: 144 mg/dL (ref 100–199)
HDL: 34 mg/dL — ABNORMAL LOW (ref >39)
LDL Chol Calc (NIH): 85 mg/dL (ref 0–99)
Triglycerides: 144 mg/dL (ref 0–149)
VLDL Cholesterol Cal: 25 mg/dL (ref 5–40)

## 2021-08-08 MED ORDER — FLUTICASONE PROPIONATE 50 MCG/ACT NA SUSP: 2.0000 | Freq: Every day | NASAL | 6 refills | Status: AC

## 2021-08-10 NOTE — Assessment & Plan Note (Signed)
Last lipid panel 05/04/2020 showed total cholesterol 137, HDL 38, LDL 81, Triglycerides 97. Remains on Atorvastatin 40mg  daily. -Obtain repeat lipid panel today

## 2021-08-10 NOTE — Assessment & Plan Note (Signed)
Not well controlled- A1c 9.4% today, which is increased from 8.2 previously (8 months ago). Patient's goal A1c is <7.0%. Somewhat limited in management options due to financial barriers (no insurance). Her dietary compliance also limited for this reason. -Continue Metformin 1000mg  BID -Increase Glimepiride to 8mg  daily -Continue ARB and statin -UTD on foot and eye exam -Obtain BMP today -Check updated lipid panel (in 04/2020 showed LDL 81)

## 2021-08-16 ENCOUNTER — Ambulatory Visit: Payer: Self-pay | Admitting: *Deleted

## 2021-08-16 ENCOUNTER — Other Ambulatory Visit: Payer: Self-pay

## 2021-08-16 ENCOUNTER — Ambulatory Visit
Admission: RE | Admit: 2021-08-16 | Discharge: 2021-08-16 | Disposition: A | Discharge status: Home / Self Care | Payer: No Typology Code available for payment source | Source: Ambulatory Visit | Attending: Obstetrics and Gynecology | Admitting: Obstetrics and Gynecology

## 2021-08-16 VITALS — BP 150/82 | Wt 163.5 lb

## 2021-08-16 DIAGNOSIS — Z01419 Encounter for gynecological examination (general) (routine) without abnormal findings: Secondary | ICD-10-CM

## 2021-08-16 DIAGNOSIS — Z1231 Encounter for screening mammogram for malignant neoplasm of breast: Secondary | ICD-10-CM

## 2021-08-16 IMAGING — MG MM DIGITAL SCREENING BILAT W/ TOMO AND CAD
8 series · 8 of 24 positions shown · non-contrast
Comparison: Previous exam(s).

CLINICAL DATA: Screening.

EXAM:
DIGITAL SCREENING BILATERAL MAMMOGRAM WITH TOMOSYNTHESIS AND CAD
TECHNIQUE: Bilateral screening digital craniocaudal and mediolateral oblique
mammograms were obtained. Bilateral screening digital breast
tomosynthesis was performed. The images were evaluated with
computer-aided detection.

[R MLO synth-2D]
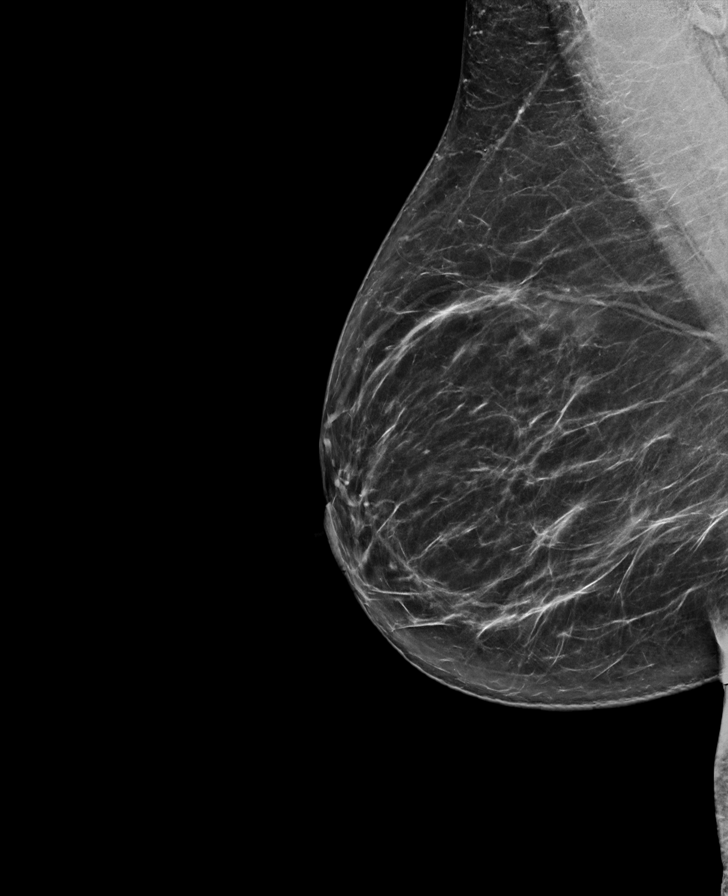

[L CC synth-2D]
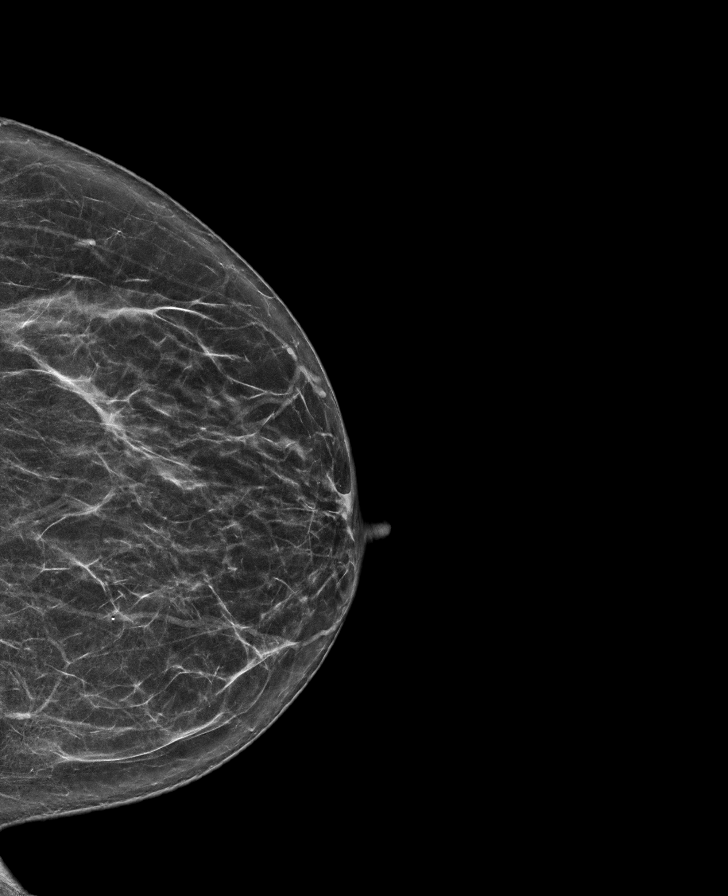

[R CC synth-2D]
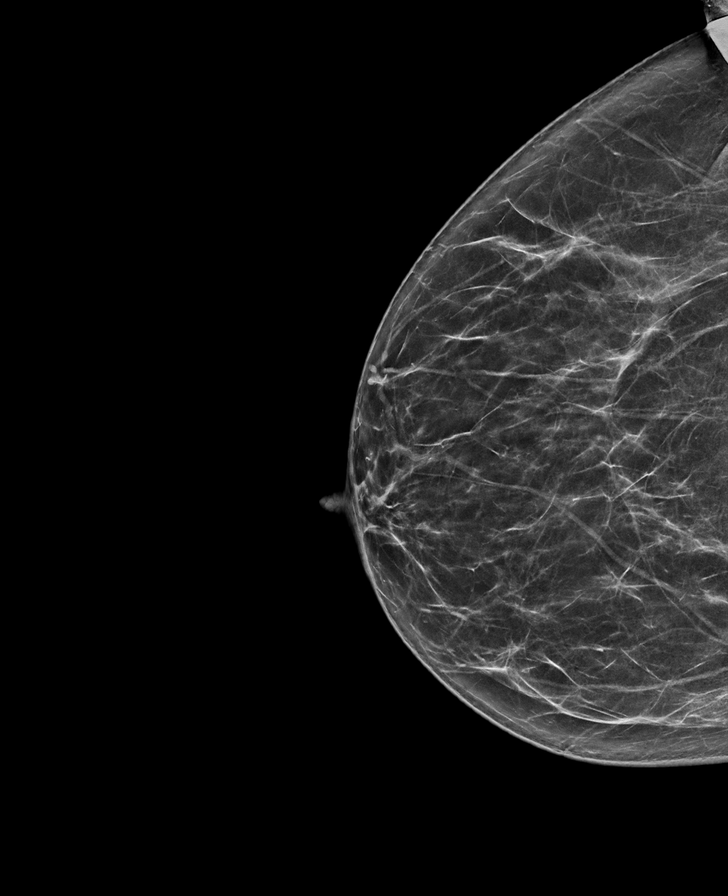

[L MLO synth-2D]
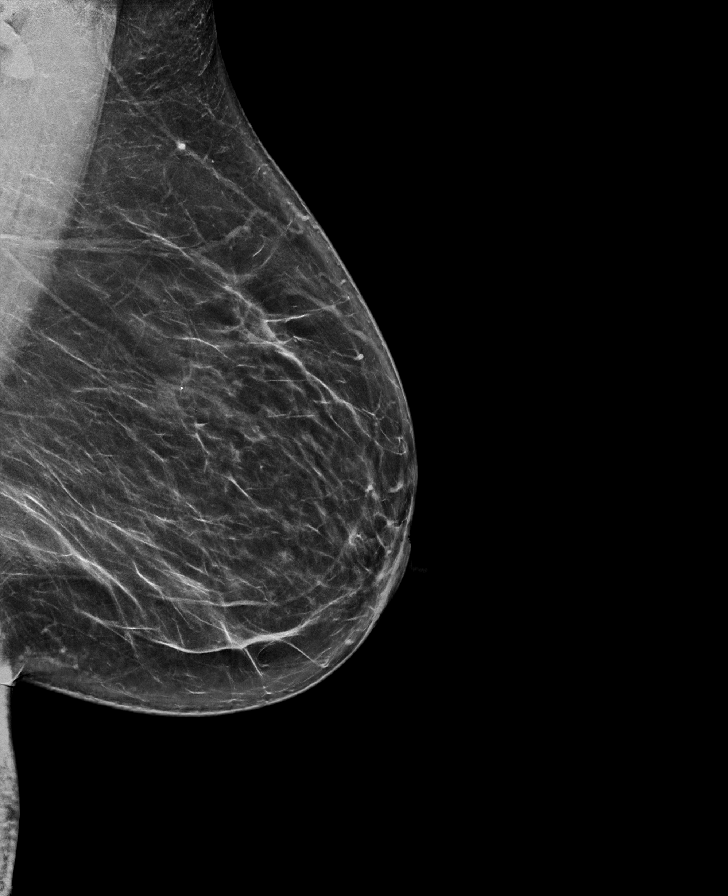

[L MLO tomo · tomo slice 41/80.0]
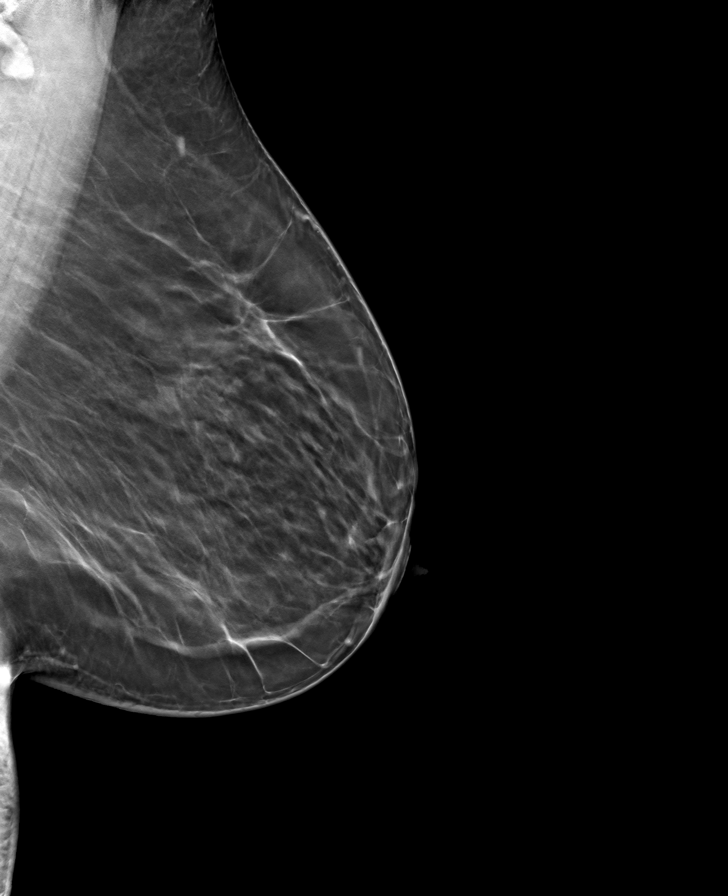

[R MLO tomo · tomo slice 39/77.0]
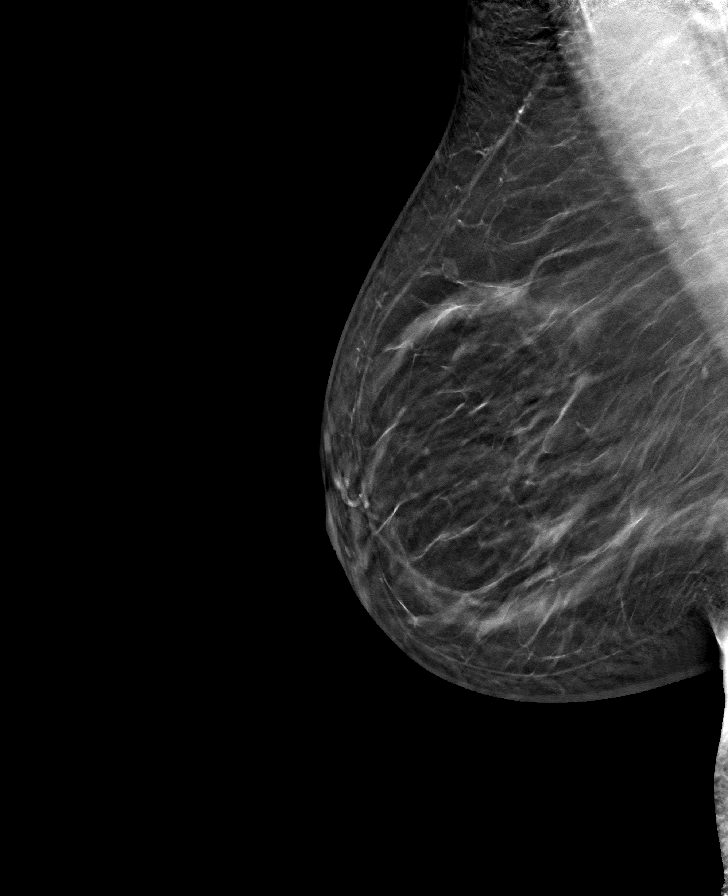

[R CC tomo · tomo slice 34/67.0]
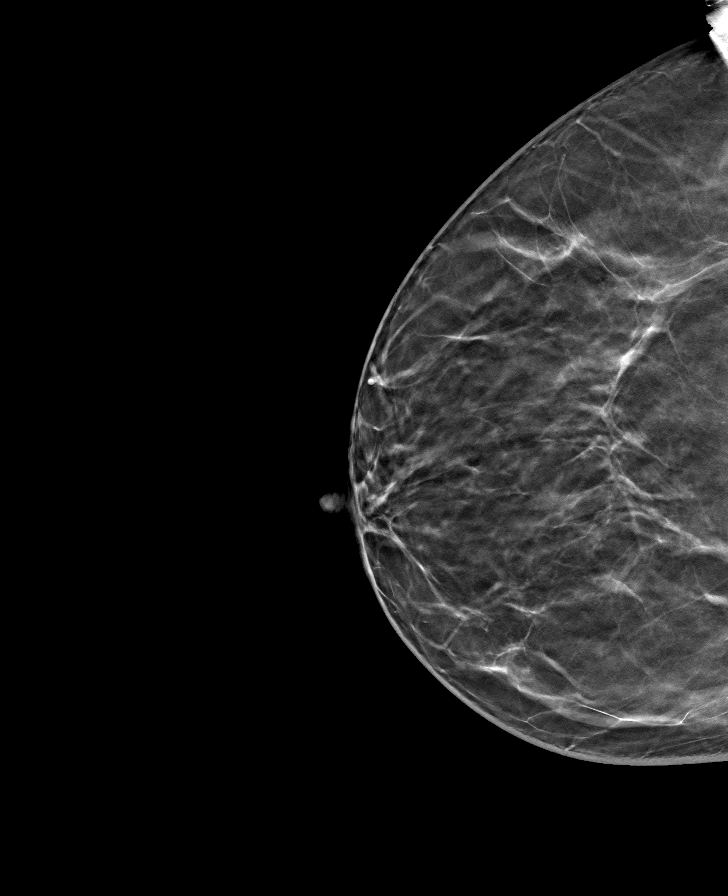

[L CC tomo · tomo slice 33/65.0]
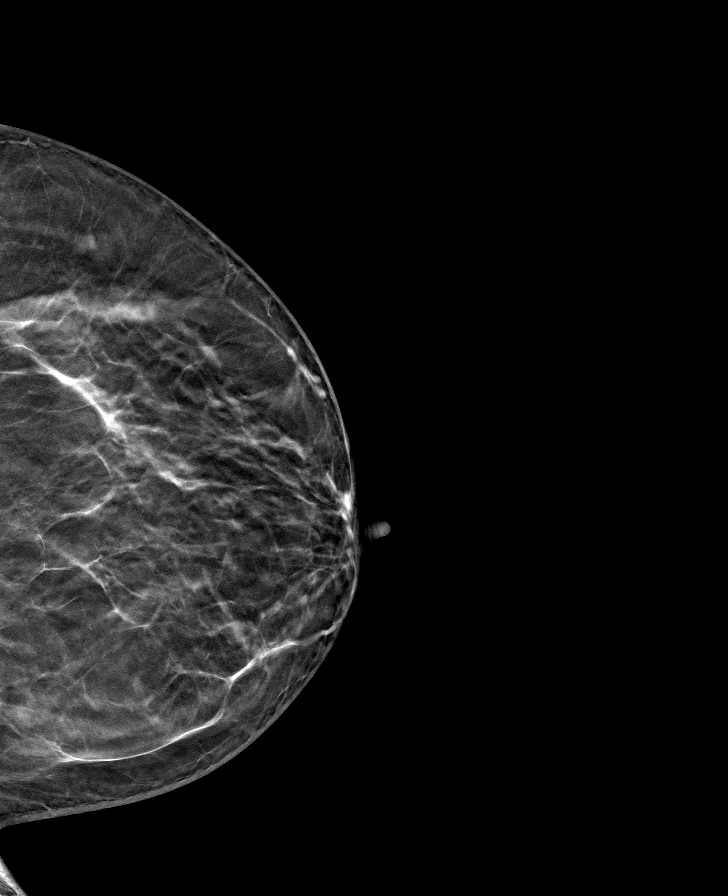

[8 of 24 positions shown; findings below may reference images not displayed]

ACR Breast Density Category b: There are scattered areas of
fibroglandular density.
FINDINGS: There are no findings suspicious for malignancy.
IMPRESSION: No mammographic evidence of malignancy. A result letter of this
screening mammogram will be mailed directly to the patient.

RECOMMENDATION:
Screening mammogram in one year. (Code:[BY])

BI-RADS CATEGORY  1: Negative.

## 2021-08-16 NOTE — Patient Instructions (Signed)
Explained breast self awareness with Sunday Corn. Pap smear completed today. Let her know [facility]CP will cover Pap smears and HPV typing every 5 years unless has a history of abnormal Pap smears. Referred patient to the [facility] of [facility] for a screening mammogram on mobile unit. Appointment scheduled Thursday, August 16, 2021 at 1530. Patient escorted to the mobile unit following BCCCP appointment for her screening mammogram. Let patient know will follow up with her within the next couple weeks with results of Pap smear by phone. Informed patient that the Breast Center will follow up with her within the next couple of weeks with results of her mammogram by letter or phone. Discussed smoking cessation with patient. Referred patient to the [facility] Quitline and gave resources to free smoking cessation classes offered at [facility]. Sunday Corn verbalized understanding.  Shelda Truby, Kathaleen Maser, RN 2:49 PM

## 2021-08-16 NOTE — Progress Notes (Signed)
Ms. GENESIS NOVOSAD is a 55 y.o. [phone] female who presents to [facility] clinic today with no complaints.    Pap Smear: Pap smear completed today. Last Pap smear was 06/20/2016 at [facility] clinic and was normal with negative HPV. Per patient has a history of an abnormal Pap smear 25-35 years that she refused follow up. Per patient all Pap smears have been normal since and she has had more than three Pap smears. Last two Pap smear results are in EPIC.   Physical exam: Breasts Breasts symmetrical. No skin abnormalities bilateral breasts. No nipple retraction bilateral breasts. No nipple discharge bilateral breasts. No lymphadenopathy. No lumps palpated bilateral breasts. No complaints of pain or tenderness on exam.  MS DIGITAL SCREENING TOMO BILATERAL  Result Date: 07/14/2020 CLINICAL DATA:  Screening. EXAM: DIGITAL SCREENING BILATERAL MAMMOGRAM WITH TOMO AND CAD COMPARISON:  Previous exam(s). ACR Breast Density Category b: There are scattered areas of fibroglandular density. FINDINGS: There are no findings suspicious for malignancy. Images were processed with CAD. IMPRESSION: No mammographic evidence of malignancy. A result letter of this screening mammogram will be mailed directly to the patient. RECOMMENDATION: Screening mammogram in one year. (Code:SM-B-01Y) BI-RADS CATEGORY  1: Negative. Electronically Signed   By: Harmon Pier M.D.   On: 07/14/2020 08:59         Pelvic/Bimanual Ext Genitalia No lesions, no swelling and no discharge observed on external genitalia.        Vagina Vagina pink and normal texture. No lesions or discharge observed in vagina.        Cervix Cervix is present. Cervix pink and of normal texture. No discharge observed.    Uterus Uterus is present and palpable. Uterus in normal position and normal size.        Adnexae Bilateral ovaries present and palpable. No tenderness on palpation.         Rectovaginal No rectal exam completed today since patient had no rectal complaints.  No skin abnormalities observed on exam.     Smoking History: Patient is a current smoker. Discussed smoking cessation with patient. Referred patient to the [facility] Quitline and gave resources to free smoking cessation classes offered at [facility].   Patient Navigation: Patient education provided. Access to services provided for patient through B[facility]CP program.  Colorectal Cancer Screening: Per patient has never had colonoscopy completed. Patient stated she completed a FIT Test in 2019. No complaints today.   Breast and Cervical Cancer Risk Assessment: Patient has family history of her younger sister and her maternal grandmother having breast cancer. Patient has no known genetic mutations or history of radiation treatment to the chest before age 76. Patient does not have history of cervical dysplasia, immunocompromised, or DES exposure in-utero.  Risk Assessment     Risk Scores       08/16/2021 07/13/2020   Last edited by: Narda Rutherford, LPN Meryl Dare, CMA   5-year risk: 2.2 % 2.2 %   Lifetime risk: 11.7 % 12 %            A: BCCCP exam with pap smear No complaints.  P: Referred patient to the [facility] of [facility] for a screening mammogram on mobile unit. Appointment scheduled Thursday, August 16, 2021 at 1530.  Priscille Heidelberg, RN 08/16/2021 2:49 PM

## 2021-08-20 ENCOUNTER — Telehealth: Payer: Self-pay

## 2021-08-20 LAB — CYTOLOGY - PAP
Comment: NEGATIVE
Diagnosis: NEGATIVE
Diagnosis: REACTIVE
High risk HPV: NEGATIVE

## 2021-08-20 NOTE — Telephone Encounter (Signed)
Left message on voicemail requesting return call. Attempted to contact patient regarding lab results (pap).

## 2021-10-26 ENCOUNTER — Other Ambulatory Visit: Payer: Self-pay | Admitting: Family Medicine

## 2021-10-26 DIAGNOSIS — E119 Type 2 diabetes mellitus without complications: Secondary | ICD-10-CM

## 2021-10-26 DIAGNOSIS — I10 Essential (primary) hypertension: Secondary | ICD-10-CM

## 2021-10-26 DIAGNOSIS — I5022 Chronic systolic (congestive) heart failure: Secondary | ICD-10-CM

## 2021-11-05 ENCOUNTER — Other Ambulatory Visit: Payer: Self-pay

## 2021-11-05 DIAGNOSIS — I5022 Chronic systolic (congestive) heart failure: Secondary | ICD-10-CM

## 2021-11-05 DIAGNOSIS — I10 Essential (primary) hypertension: Secondary | ICD-10-CM

## 2021-11-05 MED ORDER — CARVEDILOL 6.25 MG PO TABS: 6.2500 mg | ORAL_TABLET | Freq: Two times a day (BID) | ORAL | 2 refills | Status: DC

## 2021-11-05 MED ORDER — ATORVASTATIN CALCIUM 40 MG PO TABS: 40.0000 mg | ORAL_TABLET | Freq: Every day | ORAL | 10 refills | Status: DC

## 2021-11-05 MED ORDER — FUROSEMIDE 40 MG PO TABS: 40.0000 mg | ORAL_TABLET | Freq: Every day | ORAL | 4 refills | Status: DC

## 2022-01-01 ENCOUNTER — Ambulatory Visit (INDEPENDENT_AMBULATORY_CARE_PROVIDER_SITE_OTHER): Payer: Self-pay

## 2022-01-01 ENCOUNTER — Ambulatory Visit (HOSPITAL_COMMUNITY)
Admission: RE | Admit: 2022-01-01 | Discharge: 2022-01-01 | Disposition: A | Discharge status: Home / Self Care | Payer: Self-pay | Source: Ambulatory Visit | Attending: Family Medicine | Admitting: Family Medicine

## 2022-01-01 ENCOUNTER — Emergency Department (HOSPITAL_COMMUNITY)
Admission: EM | Admit: 2022-01-01 | Discharge: 2022-01-01 | Disposition: A | Discharge status: Home / Self Care | Payer: No Typology Code available for payment source | Attending: Emergency Medicine | Admitting: Emergency Medicine

## 2022-01-01 ENCOUNTER — Encounter (HOSPITAL_COMMUNITY): Payer: Self-pay

## 2022-01-01 ENCOUNTER — Other Ambulatory Visit: Payer: Self-pay

## 2022-01-01 VITALS — BP 148/80 | HR 62 | Temp 98.3°F | Resp 16 | Ht 62.0 in | Wt 163.6 lb

## 2022-01-01 DIAGNOSIS — R6883 Chills (without fever): Secondary | ICD-10-CM

## 2022-01-01 DIAGNOSIS — Z7984 Long term (current) use of oral hypoglycemic drugs: Secondary | ICD-10-CM | POA: Insufficient documentation

## 2022-01-01 DIAGNOSIS — R0602 Shortness of breath: Secondary | ICD-10-CM

## 2022-01-01 DIAGNOSIS — R052 Subacute cough: Secondary | ICD-10-CM

## 2022-01-01 DIAGNOSIS — J40 Bronchitis, not specified as acute or chronic: Secondary | ICD-10-CM | POA: Insufficient documentation

## 2022-01-01 DIAGNOSIS — I110 Hypertensive heart disease with heart failure: Secondary | ICD-10-CM | POA: Insufficient documentation

## 2022-01-01 DIAGNOSIS — I509 Heart failure, unspecified: Secondary | ICD-10-CM | POA: Insufficient documentation

## 2022-01-01 DIAGNOSIS — D649 Anemia, unspecified: Secondary | ICD-10-CM | POA: Insufficient documentation

## 2022-01-01 DIAGNOSIS — Z79899 Other long term (current) drug therapy: Secondary | ICD-10-CM | POA: Insufficient documentation

## 2022-01-01 DIAGNOSIS — E119 Type 2 diabetes mellitus without complications: Secondary | ICD-10-CM | POA: Insufficient documentation

## 2022-01-01 DIAGNOSIS — F17200 Nicotine dependence, unspecified, uncomplicated: Secondary | ICD-10-CM | POA: Insufficient documentation

## 2022-01-01 DIAGNOSIS — E876 Hypokalemia: Secondary | ICD-10-CM | POA: Insufficient documentation

## 2022-01-01 LAB — CBC WITH DIFFERENTIAL/PLATELET
Abs Immature Granulocytes: 0.01 10*3/uL (ref 0.00–0.07)
Basophils Absolute: 0 10*3/uL (ref 0.0–0.1)
Basophils Relative: 1 %
Eosinophils Absolute: 0.2 10*3/uL (ref 0.0–0.5)
Eosinophils Relative: 2 %
HCT: 34.2 % — ABNORMAL LOW (ref 36.0–46.0)
Hemoglobin: 11 g/dL — ABNORMAL LOW (ref 12.0–15.0)
Immature Granulocytes: 0 %
Lymphocytes Relative: 43 %
Lymphs Abs: 3.1 10*3/uL (ref 0.7–4.0)
MCH: 26.1 pg (ref 26.0–34.0)
MCHC: 32.2 g/dL (ref 30.0–36.0)
MCV: 81.2 fL (ref 80.0–100.0)
Monocytes Absolute: 0.4 10*3/uL (ref 0.1–1.0)
Monocytes Relative: 6 %
Neutro Abs: 3.5 10*3/uL (ref 1.7–7.7)
Neutrophils Relative %: 48 %
Platelets: 262 10*3/uL (ref 150–400)
RBC: 4.21 MIL/uL (ref 3.87–5.11)
RDW: 16.4 % — ABNORMAL HIGH (ref 11.5–15.5)
WBC: 7.3 10*3/uL (ref 4.0–10.5)
nRBC: 0 % (ref 0.0–0.2)

## 2022-01-01 LAB — BASIC METABOLIC PANEL
Anion gap: 11 (ref 5–15)
BUN: 9 mg/dL (ref 6–20)
CO2: 27 mmol/L (ref 22–32)
Calcium: 9.2 mg/dL (ref 8.9–10.3)
Chloride: 105 mmol/L (ref 98–111)
Creatinine, Ser: 0.94 mg/dL (ref 0.44–1.00)
GFR, Estimated: >60 mL/min (ref >60)
Glucose, Bld: 141 mg/dL — ABNORMAL HIGH (ref 70–99)
Potassium: 3.4 mmol/L — ABNORMAL LOW (ref 3.5–5.1)
Sodium: 143 mmol/L (ref 135–145)

## 2022-01-01 LAB — BRAIN NATRIURETIC PEPTIDE: B Natriuretic Peptide: 477.3 pg/mL — ABNORMAL HIGH (ref 0.0–100.0)

## 2022-01-01 IMAGING — DX DG CHEST 2V
2 series · 2 of 2 positions shown · non-contrast
Comparison: Chest x-ray dated [DATE]

CLINICAL DATA: Shortness of breath

EXAM:
CHEST - 2 VIEW

[chest pa]
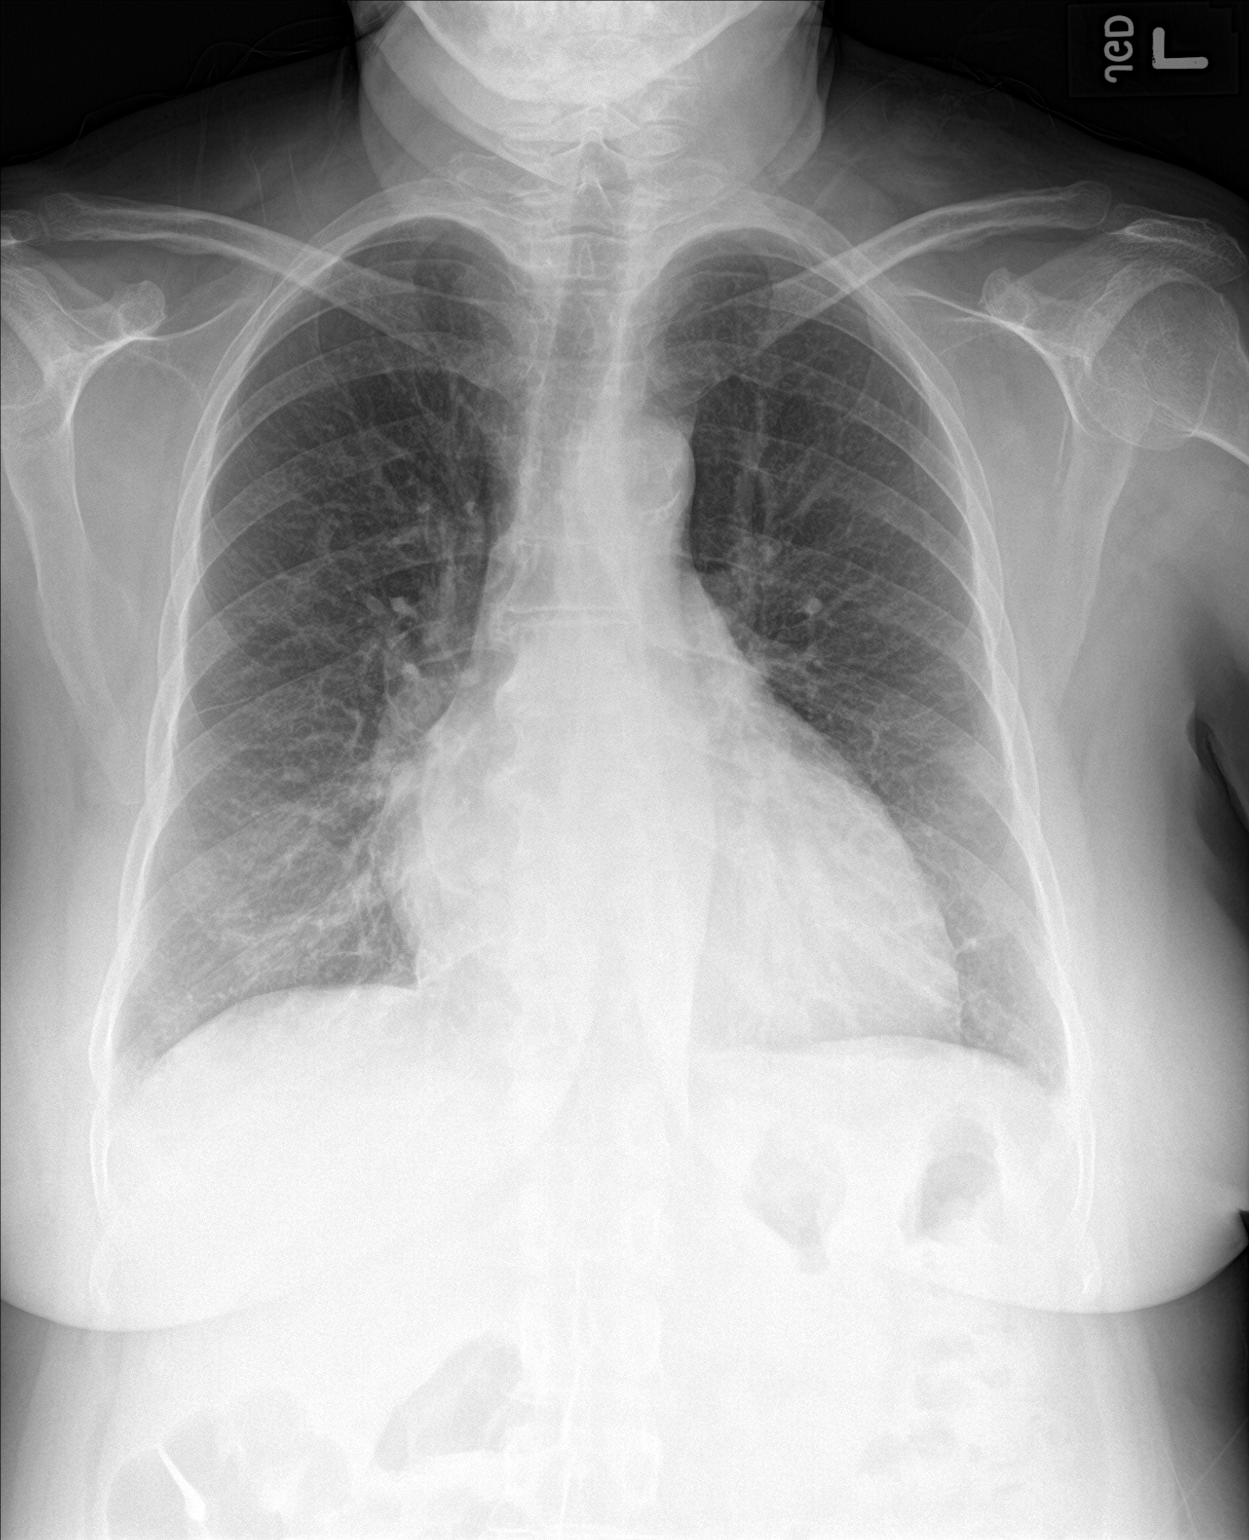

[chest lat]
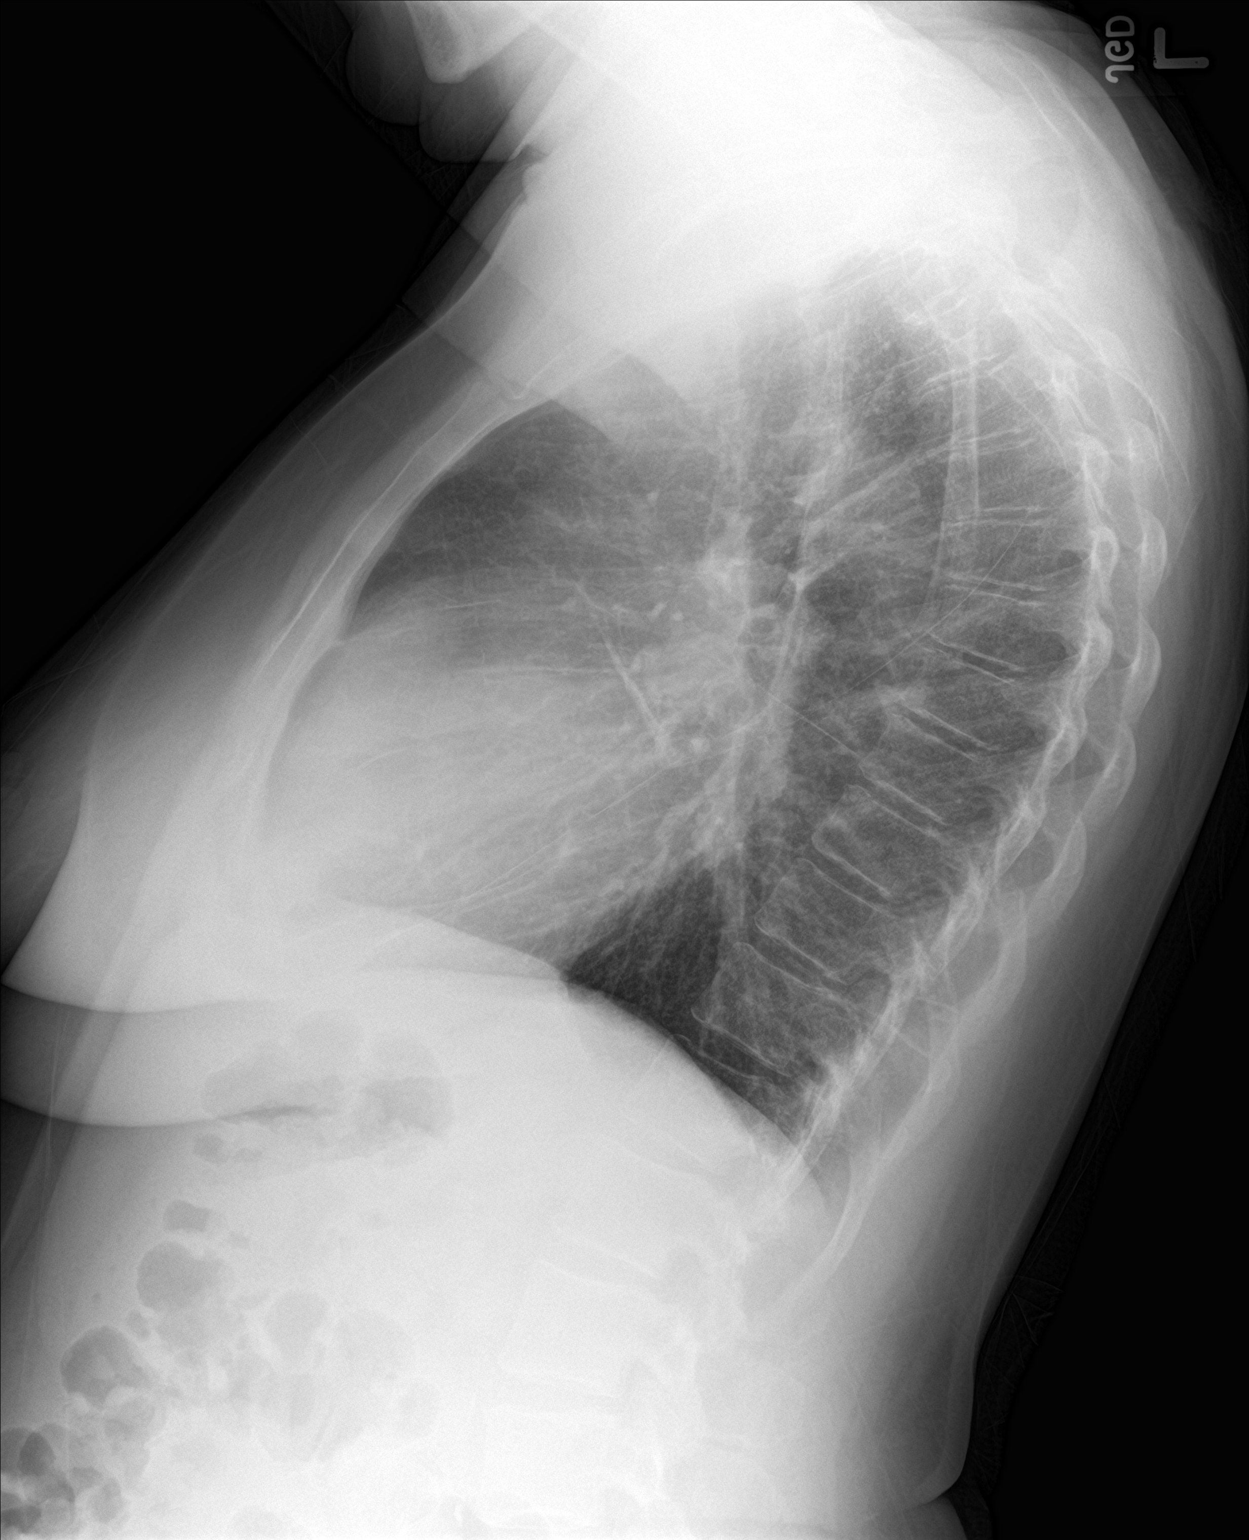

[2 of 2 positions shown; findings below may reference images not displayed]

FINDINGS: The heart size and mediastinal contours are within normal limits.
Both lungs are clear. The visualized skeletal structures are
unremarkable.
IMPRESSION: No active cardiopulmonary disease.

## 2022-01-01 MED ORDER — DEXAMETHASONE 10 MG/ML FOR PEDIATRIC ORAL USE
10.0000 mg | Freq: Once | INTRAMUSCULAR | Status: AC
  Administered 2022-01-01: 10 mg via ORAL
  Filled 2022-01-01: qty 1

## 2022-01-01 NOTE — ED Provider Notes (Signed)
?[name] EMERGENCY DEPARTMENT ?Provider Note ? ? ?CSN: [id number] ?Arrival date & time: 01/01/22  1134 ? ?  ? ?History ?Patient has a past medical history of diabetes type 2, hyperlipidemia, hypertension, moderate mitral regurgitation, moderate aortic regurgitation, CHF, and hypokalemia ?Chief Complaint  ?Patient presents with  ? Shortness of Breath  ? Cough  ? ? ?Grace Ferrell is a 56 y.o. female. ?Patient presents the emergency department for chief complaint of shortness of breath.  She states that for the past month she has had a cough has been persistent.  Cough is dry and not productive.  Over the past month, she has had some increased work of breathing.  She says over the past 2 days she feels like it is gotten a lot worse.  She feels like she is wheezing a lot.  She was seen by the urgent care today who did initial chest x-ray that was normal.  They sent her over here because they did not know what else could be causing her symptoms. ?Patient denies any chest pain, dizziness, fevers, chills, congestion, rhinorrhea, sore throat, abdominal pain, nausea, vomiting, or leg swelling ? ?Shortness of Breath ?Associated symptoms: cough and wheezing   ?Cough ?Associated symptoms: shortness of breath and wheezing   ? ?  ? ?Home Medications ?Prior to Admission medications   ?Medication Sig Start Date End Date Taking? Authorizing Provider  ?atorvastatin (LIPITOR) 40 MG tablet Take 1 tablet (40 mg total) by mouth daily. 11/05/21  Yes Maury Dus, MD  ?carvedilol (COREG) 6.25 MG tablet Take 1 tablet (6.25 mg total) by mouth 2 (two) times daily with a meal. 11/05/21  Yes Maury Dus, MD  ?cetirizine (ZYRTEC) 10 MG tablet Take 1 tablet (10 mg total) by mouth daily. 06/18/21  Yes Maury Dus, MD  ?fluticasone (FLONASE) 50 MCG/ACT nasal spray Place 2 sprays into both nostrils daily. ?Patient taking differently: Place 2 sprays into both nostrils daily as needed for allergies. 08/08/21  Yes Maury Dus, MD  ?furosemide (LASIX) 40 MG tablet Take 1 tablet (40 mg total) by mouth daily. 11/05/21  Yes Maury Dus, MD  ?glimepiride (AMARYL) 4 MG tablet TAKE TWO TABLETS BY MOUTH DAILY WITH BREAKFAST ?Patient taking differently: Take 8 mg by mouth daily with breakfast. 10/26/21  Yes Maury Dus, MD  ?metFORMIN (GLUCOPHAGE) 1000 MG tablet TAKE ONE TABLET BY MOUTH TWICE A DAY WITH A MEAL ?Patient taking differently: Take 1,000 mg by mouth 2 (two) times daily with a meal. 10/26/21  Yes Maury Dus, MD  ?Multiple Vitamins-Minerals (WOMENS MULTIVITAMIN) TABS Take 1 tablet by mouth daily.   Yes [provider]  ?valsartan (DIOVAN) 40 MG tablet TAKE ONE TABLET BY MOUTH DAILY ?Patient taking differently: Take 40 mg by mouth daily. 10/26/21  Yes Maury Dus, MD  ?Potassium Chloride ER 20 MEQ TBCR Take 10 mEq by mouth 2 (two) times daily for 3 days. 12/14/20 12/17/20  Meccariello, Solmon Ice, DO  ?   ? ?Allergies    ?Tessalon perles [benzonatate], Codeine, Darvocet [propoxyphene n-acetaminophen], Enalapril, Morphine and related, Sulfamethoxazole-trimethoprim, and Vicodin [hydrocodone-acetaminophen]   ? ?Review of Systems   ?Review of Systems  ?Respiratory:  Positive for cough, shortness of breath and wheezing.   ?All other systems reviewed and are negative. ? ?Physical Exam ?Updated Vital Signs ?BP 136/71   Pulse 68   Temp 98 ?F (36.7 ?C) (Oral)   Resp 16   SpO2 95%  ?Physical Exam ?Vitals and nursing note reviewed.  ?Constitutional:   ?  General: She is not in acute distress. ?   Appearance: Normal appearance. She is well-developed. She is not ill-appearing, toxic-appearing or diaphoretic.  ?HENT:  ?   Head: Normocephalic and atraumatic.  ?   Nose: No nasal deformity.  ?   Mouth/Throat:  ?   Lips: Pink. No lesions.  ?   Mouth: Mucous membranes are moist. No injury, lacerations, oral lesions or angioedema.  ?   Pharynx: Oropharynx is clear. Uvula midline. No pharyngeal swelling, oropharyngeal exudate,  posterior oropharyngeal erythema or uvula swelling.  ?Eyes:  ?   General: Gaze aligned appropriately. No scleral icterus.    ?   Right eye: No discharge.     ?   Left eye: No discharge.  ?   Conjunctiva/sclera: Conjunctivae normal.  ?   Right eye: Right conjunctiva is not injected. No exudate or hemorrhage. ?   Left eye: Left conjunctiva is not injected. No exudate or hemorrhage. ?Neck:  ?   Trachea: No tracheal deviation.  ?Cardiovascular:  ?   Rate and Rhythm: Normal rate and regular rhythm.  ?   Pulses: Normal pulses.     ?     Radial pulses are 2+ on the right side and 2+ on the left side.  ?     Dorsalis pedis pulses are 2+ on the right side and 2+ on the left side.  ?   Heart sounds: Normal heart sounds, S1 normal and S2 normal. Heart sounds not distant. No murmur heard. ?  No friction rub. No gallop. No S3 or S4 sounds.  ?Pulmonary:  ?   Effort: Pulmonary effort is normal. No tachypnea, accessory muscle usage or respiratory distress.  ?   Breath sounds: Normal breath sounds. No stridor. No wheezing, rhonchi or rales.  ?Chest:  ?   Chest wall: No tenderness.  ?Abdominal:  ?   General: Abdomen is flat. Bowel sounds are normal. There is no distension.  ?   Palpations: Abdomen is soft. There is no mass or pulsatile mass.  ?   Tenderness: There is no abdominal tenderness. There is no guarding or rebound.  ?Musculoskeletal:  ?   Right lower leg: No edema.  ?   Left lower leg: No edema.  ?Skin: ?   General: Skin is warm and dry.  ?   Coloration: Skin is not jaundiced or pale.  ?   Findings: No bruising, erythema, lesion or rash.  ?Neurological:  ?   General: No focal deficit present.  ?   Mental Status: She is alert and oriented to person, place, and time.  ?   GCS: GCS eye subscore is 4. GCS verbal subscore is 5. GCS motor subscore is 6.  ?Psychiatric:     ?   Mood and Affect: Mood normal. Mood is not anxious.     ?   Behavior: Behavior normal. Behavior is not agitated. Behavior is cooperative.  ? ? ?ED Results /  Procedures / Treatments   ?Labs ?(all labs ordered are listed, but only abnormal results are displayed) ?Labs Reviewed  ?CBC WITH DIFFERENTIAL/PLATELET - Abnormal; Notable for the following components:  ?    Result Value  ? Hemoglobin 11.0 (*)   ? HCT 34.2 (*)   ? RDW 16.4 (*)   ? All other components within normal limits  ?BASIC METABOLIC PANEL - Abnormal; Notable for the following components:  ? Potassium 3.4 (*)   ? Glucose, Bld 141 (*)   ? All other components within normal limits  ?  BRAIN NATRIURETIC PEPTIDE - Abnormal; Notable for the following components:  ? B Natriuretic Peptide 477.3 (*)   ? All other components within normal limits  ? ? ?EKG ?None ? ?Radiology ?DG Chest 2 View ? ?Result Date: 01/01/2022 ?CLINICAL DATA:  Shortness of breath EXAM: CHEST - 2 VIEW COMPARISON:  Chest x-ray dated October 19, 2018 FINDINGS: The heart size and mediastinal contours are within normal limits. Both lungs are clear. The visualized skeletal structures are unremarkable. IMPRESSION: No active cardiopulmonary disease. Electronically Signed   By: Allegra Lai M.D.   On: 01/01/2022 11:03   ? ?Procedures ?Procedures  ?This patient was on telemetry or cardiac monitoring during their time in the ED.  ? ? ?Medications Ordered in ED ?Medications  ?dexamethasone (DECADRON) 10 MG/ML injection for Pediatric ORAL use 10 mg (10 mg Oral Given 01/01/22 1405)  ? ? ?ED Course/ Medical Decision Making/ A&P ?  ?                        ?Medical Decision Making ?Amount and/or Complexity of Data Reviewed ?Labs: ordered. ? ? ? ?MDM  ?This is a 56 y.o. female who presents to the ED with one month of cough and shortness of breath.  ? ?My Impression, Plan, and ED Course: Vitals are stable with no tachycardia.  Oxygenating well on room air.  no fever.  Patient looks well.  She has normal breathing effort.  Lungs are clear to auscultation bilaterally.  Low risk for pulmonary embolism.  I reviewed chest x-ray done at urgent care and he has no  findings that are acute.  Will obtain labs here. ? ?I personally ordered, reviewed, and interpreted all laboratory work and imaging and agree with radiologist interpretation. Results interpreted below: CBC with stable

## 2022-01-01 NOTE — ED Provider Notes (Signed)
?[facility] ? ? ? ?CSN: [id number] ?Arrival date & time: 01/01/22  1022 ? ? ?  ? ?History   ?Chief Complaint ?Chief Complaint  ?Patient presents with  ? Cough  ? Shortness of Breath  ? Nasal Congestion  ? Diarrhea  ? ? ?HPI ?Grace Ferrell is a 56 y.o. female.  ? ?Patient is here for having a hard time catching her breath.  Going x several weeks.   She gets sob even walking in her house (no stairs) makes her sob.  She needs to sit and rest for a while.  ?Also with cough, wheezing x several weeks.  That comes/goes.  ?Also with runny nose, congestion.  + wheezing.  ?No h/o asthma.  She states she was told she had copd and that was years ago.  No treatment thus far.  ?She smokes 1/2 ppd.  ?+ chills, no fever.   ?She states she just feels so bad, in terms of the chills, cough, phlegm.  ?She feels nauseated at times, no vomiting.  This happens after coughing.  ?No edema noted.  ?Also with diarrhea, on going.  She thinks that is due to the metformiin.  No blood or black tarry stool.  ?Denies cp or palpitations ? ?Past Medical History:  ?Diagnosis Date  ? Diabetes mellitus   ? Hypercholesteremia   ? Hypertension   ? ? ?Patient Active Problem List  ? Diagnosis Date Noted  ? Hypokalemia 12/22/2020  ? Chronic systolic heart failure (HCC) 10/26/2015  ? Moderate mitral regurgitation by prior echocardiogram 10/21/2014  ? Moderate aortic regurgitation 10/21/2014  ? Essential hypertension   ? Mixed hyperlipidemia due to type 2 diabetes mellitus (HCC) 03/12/2012  ? TOBACCO USER 07/29/2009  ? DM2 (diabetes mellitus, type 2) (HCC) 12/18/2006  ? ? ?Past Surgical History:  ?Procedure Laterality Date  ? ENDOMETRIAL ABLATION    ? KNEE SURGERY    ? TUBAL LIGATION    ? ? ?OB History   ? ? Gravida  ?3  ? Para  ?3  ? Term  ?3  ? Preterm  ?   ? AB  ?   ? Living  ?3  ?  ? ? SAB  ?   ? IAB  ?   ? Ectopic  ?   ? Multiple  ?   ? Live Births  ?3  ?   ?  ?  ? ? ? ?Home Medications   ? ?Prior to Admission medications   ?Medication Sig Start  Date End Date Taking? Authorizing Provider  ?amoxicillin-clavulanate (AUGMENTIN) 875-125 MG tablet Take 1 tablet by mouth 2 (two) times daily. 05/01/21   Waldon Merl, PA-C  ?atorvastatin (LIPITOR) 40 MG tablet Take 1 tablet (40 mg total) by mouth daily. 11/05/21   Maury Dus, MD  ?carvedilol (COREG) 6.25 MG tablet Take 1 tablet (6.25 mg total) by mouth 2 (two) times daily with a meal. 11/05/21   Maury Dus, MD  ?cetirizine (ZYRTEC) 10 MG tablet Take 1 tablet (10 mg total) by mouth daily. 06/18/21   Maury Dus, MD  ?fluticasone (FLONASE) 50 MCG/ACT nasal spray Place 2 sprays into both nostrils daily. 08/08/21   Maury Dus, MD  ?furosemide (LASIX) 40 MG tablet Take 1 tablet (40 mg total) by mouth daily. 11/05/21   Maury Dus, MD  ?glimepiride (AMARYL) 4 MG tablet TAKE TWO TABLETS BY MOUTH DAILY WITH BREAKFAST 10/26/21   Maury Dus, MD  ?metFORMIN (GLUCOPHAGE) 1000 MG tablet TAKE ONE TABLET BY MOUTH TWICE  A DAY WITH A MEAL 10/26/21   Maury Dus, MD  ?Potassium Chloride ER 20 MEQ TBCR Take 10 mEq by mouth 2 (two) times daily for 3 days. 12/14/20 12/17/20  Meccariello, Solmon Ice, DO  ?valsartan (DIOVAN) 40 MG tablet TAKE ONE TABLET BY MOUTH DAILY 10/26/21   Maury Dus, MD  ? ? ?Family History ?Family History  ?Problem Relation Age of Onset  ? Stroke Mother   ? Hypertension Sister   ? Diabetes Sister   ? Breast cancer Sister   ? Hypertension Brother   ? Breast cancer Maternal Grandmother   ? Hypertension Brother   ? Hypertension Sister   ? Stroke Sister   ? Hypertension Sister   ? ? ?Social History ?Social History  ? ?Tobacco Use  ? Smoking status: Every Day  ?  Packs/day: 0.50  ?  Years: 21.00  ?  Pack years: 10.50  ?  Types: Cigarettes  ? Smokeless tobacco: Never  ?Vaping Use  ? Vaping Use: Never used  ?Substance Use Topics  ? Alcohol use: No  ?  Alcohol/week: 0.0 standard drinks  ? Drug use: No  ? ? ? ?Allergies   ?Tessalon perles [benzonatate], Codeine, Darvocet [propoxyphene  n-acetaminophen], Enalapril, Morphine and related, Sulfamethoxazole-trimethoprim, and Vicodin [hydrocodone-acetaminophen] ? ? ?Review of Systems ?Review of Systems  ?Constitutional:  Positive for chills and fatigue. Negative for fever.  ?HENT:  Positive for congestion and rhinorrhea. Negative for sinus pain and sore throat.   ?Respiratory:  Positive for cough, shortness of breath and wheezing. Negative for chest tightness.   ?Cardiovascular:  Negative for chest pain, palpitations and leg swelling.  ?Gastrointestinal:  Positive for diarrhea.  ?Genitourinary: Negative.   ?Musculoskeletal: Negative.   ? ? ?Physical Exam ?Triage Vital Signs ?ED Triage Vitals [01/01/22 1043]  ?Enc Vitals Group  ?   BP (!) 148/80  ?   Pulse Rate 62  ?   Resp 16  ?   Temp 98.3 ?F (36.8 ?C)  ?   Temp Source Oral  ?   SpO2 98 %  ?   Weight 163 lb 9.3 oz (74.2 kg)  ?   Height 5\' 2"  (1.575 m)  ?   Head Circumference   ?   Peak Flow   ?   Pain Score 0  ?   Pain Loc   ?   Pain Edu?   ?   Excl. in GC?   ? ?No data found. ? ?Updated Vital Signs ?BP (!) 148/80 (BP Location: Right Arm)   Pulse 62   Temp 98.3 ?F (36.8 ?C) (Oral)   Resp 16   Ht 5\' 2"  (1.575 m)   Wt 74.2 kg   SpO2 98%   BMI 29.92 kg/m?  ? ?Visual Acuity ?Right Eye Distance:   ?Left Eye Distance:   ?Bilateral Distance:   ? ?Right Eye Near:   ?Left Eye Near:    ?Bilateral Near:    ? ?Physical Exam ?Constitutional:   ?   Appearance: She is well-developed.  ?HENT:  ?   Head: Normocephalic and atraumatic.  ?Neck:  ?   Vascular: No JVD.  ?Cardiovascular:  ?   Rate and Rhythm: Normal rate and regular rhythm.  ?Pulmonary:  ?   Effort: Pulmonary effort is normal.  ?   Breath sounds: Normal breath sounds. No decreased breath sounds, wheezing or rhonchi.  ?Abdominal:  ?   Palpations: Abdomen is soft.  ?Musculoskeletal:  ?   Cervical back: Normal range of motion and  neck supple.  ?   Right lower leg: No edema.  ?   Left lower leg: No edema.  ?Lymphadenopathy:  ?   Cervical: No cervical  adenopathy.  ?Skin: ?   General: Skin is warm and dry.  ?Neurological:  ?   General: No focal deficit present.  ?   Mental Status: She is alert.  ?Psychiatric:     ?   Mood and Affect: Mood normal.     ?   Behavior: Behavior normal.  ? ? ? ?UC Treatments / Results  ?Labs ?(all labs ordered are listed, but only abnormal results are displayed) ?Labs Reviewed - No data to display ? ?EKG ? ? ?Radiology ?DG Chest 2 View ? ?Result Date: 01/01/2022 ?CLINICAL DATA:  Shortness of breath EXAM: CHEST - 2 VIEW COMPARISON:  Chest x-ray dated October 19, 2018 FINDINGS: The heart size and mediastinal contours are within normal limits. Both lungs are clear. The visualized skeletal structures are unremarkable. IMPRESSION: No active cardiopulmonary disease. Electronically Signed   By: Allegra Lai M.D.   On: 01/01/2022 11:03   ? ?Procedures ?Procedures (including critical care time) ? ?Medications Ordered in UC ?Medications - No data to display ? ?Initial Impression / Assessment and Plan / UC Course  ?I have reviewed the triage vital signs and the nursing notes. ? ?Pertinent labs & imaging results that were available during my care of the patient were reviewed by me and considered in my medical decision making (see chart for details). ? ?Patient was seen today for worsening sob over the last several weeks.  She need to stop and rest while walking in her home.  She complains of cough, wheezing and runny nose, but no coughing during the exam and her lung exam is normal.  CXR was normal as well.  No other alarming symptoms, and her vitals are normal.  She needs further work up. Discussed checking lab work, but these results would not be resulted quickly, and if they are concerning she would need to go to the ER, vs going directly to the ER now for evaluation.  She agrees to go to the ER for further testing and work up.   ? ?Final Clinical Impressions(s) / UC Diagnoses  ? ?Final diagnoses:  ?SOB (shortness of breath) on exertion   ?Subacute cough  ?Chills  ? ? ? ?Discharge Instructions   ? ?  ?You were seen today for shortness of breath.  Given you exam is normal, and chest xray is normal, I do not have a cause for your symptoms.  I recommend you go to

## 2022-01-01 NOTE — Discharge Instructions (Signed)
No concerning findings today on your workup ?I gave you a steroid here that will help reduce some of the inflammation in your lungs. ?Please schedule an appointment with your PCP this week. ? ? ?

## 2022-01-01 NOTE — ED Triage Notes (Signed)
Pt. Stated, Ive had cough SOB for over a month. UC sent me down here for further evaluation. They didnt find anything. ?

## 2022-01-01 NOTE — Discharge Instructions (Signed)
You were seen today for shortness of breath.  Given you exam is normal, and chest xray is normal, I do not have a cause for your symptoms.  I recommend you go to the ER today for further testing.  ?

## 2022-01-01 NOTE — ED Triage Notes (Signed)
Pt reports cough, SOB, diarrhea, nasal congestion and dyspnea with exertion. Pt states these symptoms have been occurring for over a month and felt like she just had a bad sinus infection.  ?

## 2022-01-01 NOTE — ED Notes (Signed)
Patient Alert and oriented to baseline. Stable and ambulatory to baseline. Patient verbalized understanding of the discharge instructions.  Patient belongings were taken by the patient.   

## 2022-01-14 ENCOUNTER — Other Ambulatory Visit: Payer: Self-pay

## 2022-01-14 ENCOUNTER — Ambulatory Visit (INDEPENDENT_AMBULATORY_CARE_PROVIDER_SITE_OTHER): Payer: Self-pay | Admitting: Family Medicine

## 2022-01-14 VITALS — BP 143/55 | HR 66 | Ht 62.0 in | Wt 164.4 lb

## 2022-01-14 DIAGNOSIS — R1013 Epigastric pain: Secondary | ICD-10-CM

## 2022-01-14 DIAGNOSIS — R053 Chronic cough: Secondary | ICD-10-CM

## 2022-01-14 MED ORDER — FAMOTIDINE 20 MG PO TABS: 20.0000 mg | ORAL_TABLET | Freq: Two times a day (BID) | ORAL | 0 refills | Status: DC

## 2022-01-14 NOTE — Patient Instructions (Signed)
I am ordering famotidine for you to take for reflux.  I would like for you to take this for at least a month to see if it improves your symptoms.  If you develop any reflux symptoms, chest pain, shortness of breath with general activity you should go to the ER.  If this does not improve your symptoms please return. ? ?For your cough I do think we should work you up for COPD.  We do that with PFT testing.  I would like for you make an appointment upfront for Dr. Valentina Lucks for PFT testing. ?

## 2022-01-14 NOTE — Progress Notes (Signed)
? ? ?SUBJECTIVE:  ? ?CHIEF COMPLAINT / HPI:  ? ?Follow-up-ED for shortness of breath: ?56 year old female present for follow-up after being seen in the ED on 01/01/2022 for shortness of breath and diagnosed with bronchitis.  Throughout the ED work-up she had a BNP of 477 which was decreased from prior, chest x-ray from urgent care which showed no active cardiopulmonary disease, and reassuring EKG.  It was thought that this was chronic bronchitis versus COPD given patient's smoking history.  She was given steroids with recommendation to follow-up with PCP.  Today she states after the steroids she got better but then starting having coughing two or three days after. No shortness of breath except for during prolonged coughing episodes. She does have a history of reflux and states she has not been taking her meds lately for it.  She states that she has had a little bit of epigastric discomfort since after the steroids.  She denies this worsening with activity and denies any chest pains with activity or at rest.  She states that epigastric discomfort seems to be worse when lying down at night after meals. ? ?She has reduced her smoking from 10 cigs per day (for about 40 years), she has cut back to 2-3 per day. She did not take her blood pressure medicine today.  ? ?PERTINENT  PMH / PSH: History of CHF ? ?OBJECTIVE:  ? ?BP (!) 143/55   Pulse 66   Ht 5\' 2"  (1.575 m)   Wt 164 lb 6.4 oz (74.6 kg)   SpO2 98%   BMI 30.07 kg/m?   ? ?General: NAD, pleasant, able to participate in exam ?Cardiac: RRR, no murmurs. ?Respiratory: Normal effort, no respiratory distress on room air, there are few fine crackles present in bilateral bases, no wheezing, otherwise clear ?Abdomen: Bowel sounds present, mild epigastric discomfort to palpation, no pain with palpating other regions of the abdomen ?Extremities: No lower extremity swelling/edema ?Skin: warm and dry, no rashes noted ?Neuro: alert, no obvious focal deficits ?Psych: Normal  affect and mood ? ?ASSESSMENT/PLAN:  ?  ?Cough: ?Did improve after she went to the ED and received Decadron but a few days later started worsening.  She does have a 20+ pack year smoking history and is a current smoker.  She does not have PFTs on her file.  On physical exam her lungs have a few crackles present in the lower bases but are overall clear.  She has no swelling in her lower extremities.  She does have some epigastric discomfort (see below).  Most likely her cough is due to a chronic etiology such as reflux and/or COPD or irritation from smoking cigarettes.  She has no shortness of breath outside of when she has significant coughing fits and no chest pain.  We will give a trial of famotidine for reflux as she previously used Zantac and recently received steroids, will get her set up for PFT testing.  Discussed strict return precautions should she develop any chest pain, trouble breathing, or worsening symptoms. ? ?Abdominal discomfort: ?Started after receiving steroids.  She is got a history of reflux and used to take Zantac for it but is not taking anything in some time.  She states her reflux has been worse lately and this epigastric discomfort happens primarily when she lies down at night after eating.  It does not worsen when she is active and she never has chest pain or shortness of breath associated with it.  On physical exam she is got  some minor epigastric discomfort with deep palpation but no other abdominal discomfort.  Her symptoms do suggest this may be due to reflux which may have worsened in the setting of recent Decadron.  Most concern for cardiac etiology given overall presentation but she does have risk factors for this.  We will trial famotidine for a month.  If her symptoms worsen or do not improve discussed strict return precautions.  She develops any chest pain, epigastric discomfort that worsens with activity, or shortness of breath she is going to go to the ER or  follow-up. ? ? ?Jackelyn Poling, DO ?[facility]  ? ? ? ?

## 2022-01-15 ENCOUNTER — Other Ambulatory Visit: Payer: Self-pay

## 2022-01-15 ENCOUNTER — Encounter: Payer: Self-pay | Admitting: Pharmacist

## 2022-01-15 ENCOUNTER — Ambulatory Visit (INDEPENDENT_AMBULATORY_CARE_PROVIDER_SITE_OTHER): Payer: Self-pay | Admitting: Pharmacist

## 2022-01-15 DIAGNOSIS — F17200 Nicotine dependence, unspecified, uncomplicated: Secondary | ICD-10-CM

## 2022-01-15 DIAGNOSIS — R0600 Dyspnea, unspecified: Secondary | ICD-10-CM

## 2022-01-15 DIAGNOSIS — I10 Essential (primary) hypertension: Secondary | ICD-10-CM

## 2022-01-15 MED ORDER — TRELEGY ELLIPTA 200-62.5-25 MCG/ACT IN AEPB
1.0000 | INHALATION_SPRAY | Freq: Every day | RESPIRATORY_TRACT | 0 refills | Status: DC
Start: 2022-01-15 — End: 2022-04-22

## 2022-01-15 NOTE — Assessment & Plan Note (Signed)
Chronic hypertension - history of low potassium.  Consider aldosterone modulating antihypertensive at future therapy adjustment.  ?

## 2022-01-15 NOTE — Assessment & Plan Note (Signed)
Longstanding Tobacco Abuse in patient with self-reports goal to stop smoking completely within the next month. If patient has not stopped smoking at f/u visit, will re-assess smoking cessation plan and options for discuss medication therapy. ?

## 2022-01-15 NOTE — Patient Instructions (Addendum)
Nice to see you today! ? ?Today we gave you a new medication to help with your breathing called Trelegy Ellipta. You will do one inhalation once a day. Take this every day. Make sure you swish and spit water after you take your dose. ? ?We also discussed stopping smoking. Your goal is to quit in the next month.  ? ?We will plan to repeat your lung function tests in a few months when you are not feeling as sick or coughing.  ? ?If your coughing continues, you can take Robitussin-DM (guaifenesin-dextromethorphan). This can be picked up over-the-counter. ?

## 2022-01-15 NOTE — Progress Notes (Signed)
Reviewed: I agree with Dr. Koval's documentation and management. 

## 2022-01-15 NOTE — Assessment & Plan Note (Signed)
Patient has been experiencing shortness of breath with activity and coughing with unspecified duration. Patient was recently in Emergency Room for SOB which prompted visit here and is currently not taking any medications for lung function. Spirometry evaluation reveals diminished lung function, post nebulized albuterol tx revealed no sgnificant change, indicative of non-reversible obstructive lung disease. Spirometry [facility] based on CAT score and multiple exacerbations in the last year.  ?-begin Trelegy Ellipta Inhaler (fluticasone furoate/meclidinium/vilanterol) 1 puff daily  ?-Educated patient on purpose, proper use, potential adverse effects including risk of esophageal candidiasis and need to rinse mouth after each use. ?-Watched patient take first dose of Trelegy Ellipta in office to ensure proper technique. Patient displayed good inhaler technique. ?-Reviewed results of pulmonary function tests.  Pt verbalized understanding of results and education.   ?Patient did appear to have cough due to potential infection. Offered to repeat PFT later in the year to reassess lung function when fully recovered from recent exacerbation.  ? ?
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
## 2022-02-03 ENCOUNTER — Other Ambulatory Visit: Payer: Self-pay | Admitting: Family Medicine

## 2022-02-03 DIAGNOSIS — I10 Essential (primary) hypertension: Secondary | ICD-10-CM

## 2022-02-03 DIAGNOSIS — E119 Type 2 diabetes mellitus without complications: Secondary | ICD-10-CM

## 2022-02-03 DIAGNOSIS — I5022 Chronic systolic (congestive) heart failure: Secondary | ICD-10-CM

## 2022-02-09 ENCOUNTER — Other Ambulatory Visit: Payer: Self-pay | Admitting: Family Medicine

## 2022-02-12 ENCOUNTER — Ambulatory Visit (INDEPENDENT_AMBULATORY_CARE_PROVIDER_SITE_OTHER): Payer: Self-pay | Admitting: Student

## 2022-02-12 ENCOUNTER — Ambulatory Visit (INDEPENDENT_AMBULATORY_CARE_PROVIDER_SITE_OTHER): Payer: Self-pay | Admitting: Pharmacist

## 2022-02-12 ENCOUNTER — Encounter: Payer: Self-pay | Admitting: Pharmacist

## 2022-02-12 VITALS — BP 139/58 | HR 67 | Ht 62.0 in | Wt 164.4 lb

## 2022-02-12 DIAGNOSIS — E1169 Type 2 diabetes mellitus with other specified complication: Secondary | ICD-10-CM

## 2022-02-12 DIAGNOSIS — F17200 Nicotine dependence, unspecified, uncomplicated: Secondary | ICD-10-CM

## 2022-02-12 DIAGNOSIS — I5022 Chronic systolic (congestive) heart failure: Secondary | ICD-10-CM

## 2022-02-12 DIAGNOSIS — E119 Type 2 diabetes mellitus without complications: Secondary | ICD-10-CM

## 2022-02-12 DIAGNOSIS — R0600 Dyspnea, unspecified: Secondary | ICD-10-CM

## 2022-02-12 DIAGNOSIS — E782 Mixed hyperlipidemia: Secondary | ICD-10-CM

## 2022-02-12 LAB — POCT GLYCOSYLATED HEMOGLOBIN (HGB A1C): HbA1c, POC (controlled diabetic range): 7.1 % — AB (ref 0.0–7.0)

## 2022-02-12 MED ORDER — FAMOTIDINE 20 MG PO TABS: 20.0000 mg | ORAL_TABLET | Freq: Two times a day (BID) | ORAL | 0 refills | Status: DC

## 2022-02-12 NOTE — Assessment & Plan Note (Signed)
Advised patient due to pulmonary function test being indicative of obstructive pulmonary disease, it is recommended to continue Trelegy Ellipta daily and to continue working on smoking cessation.  Patient prefers not to take Trelegy Ellipta at this time as she is not currently having dyspnea. Patient will return in 3-6 months for repeat pulmonary function test with Dr. Raymondo Band before officially putting the diagnosis of COPD in her chart.  ?

## 2022-02-12 NOTE — Assessment & Plan Note (Signed)
-   Minimal edema, bilateral legs.  Trial of furosemide (Lasix) 40 mg to every other day from every day to assess need for continued chronic diuretic.  ?- Counseled patient to check weight regularly with goal to lose 5 lbs in a month.  ?- Instructed to return to daily dosing of furosemide if weight increases by > 5 lbs or edema significantly worsens.  ?

## 2022-02-12 NOTE — Patient Instructions (Addendum)
Nice to see you today! ? ?We are happy to hear that your breathing and cough have improved and your improvement with walking capabilities. ? ?The best thing to do for your lungs is to quit smoking and you have made great progress so far. Your goal is to quit by next Wednesday, May 3. Dr. Raymondo Band will call you on this date to see how things are going! ? ?Start taking your furosemide (Lasix) every other day rather than every day. Continue checking your weight regularly.  ?

## 2022-02-12 NOTE — Progress Notes (Signed)
vs ? ? ?SUBJECTIVE:  ? ?CHIEF COMPLAINT / HPI:  ? ?T2DM ?Last HgA1c of 9.4 on 08/07/2021. Currently taking metformin 1000 mg BID and Glimepiride 8 mg daily. Doesn't check blood sugar at home. ? ?Chronic systolic heart failure ?Patient was unaware that she had this diagnosis in her chart.  Per chart review patient had echo in 2015 which showed HFrEF but repeat echo in 2016 showed normal ejection fraction.  Patient denies dyspnea on exertion currently and denies orthopnea. ? ?HLD ?Last lipid panel on 08/07/2021 showed LDL of 85.  Goal is 70 or less.  Patient currently takes Lipitor 40 mg daily. ? ?Dyspnea ?Patient had pulmonary function test on 01/15/2022 with Dr. Raymondo Band which were indicative of nonreversible obstructive lung disease.  She was started on Trelegy Ellipta inhaler 1 puff daily.  Patient reports improvement with dyspnea and cough but does not feel it is related to the Trelegy Ellipta.  She believes her dyspnea was due to a possible viral infection or being sick in general.  She would like to repeat pulmonary function tests before having the diagnosis of COPD in her chart. ? ?PERTINENT  PMH / PSH: T2DM, HLD, dyspnea, chronic systolic heart failure ? ?OBJECTIVE:  ? ?Vitals:  ? 02/12/22 1132  ?BP: (!) 139/58  ?Pulse: 67  ?SpO2: 99%  ? ? ? ?General: NAD, pleasant, able to participate in exam ?Cardiac: Regular rate, split S1, no murmurs. ?Respiratory: CTAB, normal effort, No wheezes, rales or rhonchi ?Extremities: no edema or cyanosis. ?Neuro: alert, no obvious focal deficits ?Psych: Normal affect and mood ? ?ASSESSMENT/PLAN:  ? ?Chronic systolic heart failure ?Unsure how accurate this diagnosis is currently.  Patient was offered repeat echo today.  However patient states she does not have insurance and cannot afford this test.  As last echo in 2016 was normal I think it is okay for patient to not have echocardiogram at this time.  Of note patient also has diagnosis of moderate mitral regurgitation and moderate  aortic regurgitation.  On exam today, there are no murmurs suggestive of regurgitation.  ? ?T2DM ?Hemoglobin A1c of 7.1 today, down from 9.4 on 08/07/21 ?-Continue metformin 1000mg  BD ?-Continue glimepiride 8 mg BID ?-check HgA1C in 3 months ? ?Dyspnea ? Advised patient due to pulmonary function test being indicative of obstructive pulmonary disease, it is recommended to continue Trelegy Ellipta daily and to continue working on smoking cessation.  Patient prefers not to take Trelegy Ellipta at this time as she is not currently having dyspnea. Patient will return in 3-6 months for repeat pulmonary function test with Dr. before officially putting the diagnosis of COPD in her chart.  ? ?Mixed hyperlipidemia due to type 2 diabetes mellitus ?Advised patient we could increase atorvastatin to 80 mg/day and attempt to reach goal of LDL less than or equal to 70.  Patient was not comfortable with this due to potential side effects and would like to continue on 40 mg/day. ? ?Dr. Raymondo Band, DO ?[facility]  ? ? ? ? ?

## 2022-02-12 NOTE — Progress Notes (Signed)
? ?S:  ?Patient arrives in good spirits without assistance.   Patient arrives for evaluation/assistance with tobacco dependence.   ?Patient was referred and last seen by Dr. Atha Starks on 01/14/22.  ?Patient was last seen by Primary Care Provider on 08/07/21.  ? ?Patient reports cough and has improved and that it is now more of a dry cough and not full of phlegm. She also reports that her breathing has improved and that she is able to walk further now without having to catch her breath as often and she can walk faster.  ?Patient reports that she feels the Trelegy (fluticasone-umeclidinium-vilanterol) inhaler has not helped that much. ? ?Patient reports that she has quit smoking at work and that her problem is when she gets home and on the weekends. She reports that it is due to boredom and that she can only cook and clean so much. She reports that a pack can now last her 5 days. Patient does report waking up to smoke and that she needs to break this habit. On a work day, patient reports she is confident she can complete her morning routine without smoking, but on a day where she is not working, she enjoys smoking in the morning as she knows she is "free from the job" and does not need to follow a routine. Patient reports that having a routine on a daily basis regardless of working or not will keep her from smoking. She also reports occasionally waking in the middle of the night to smoke. She also reports her sister will try to bring her cigarettes on Sundays and that she has taken the step to tell her to stop. ? ?Most recent quit attempt ongoing. Patient reports that she believes this is something she can do and that she needs to channel her energy into this. She reports that her children are nagging her to quit smoking. ?Longest time ever been tobacco free during pregnancies. ? ?Rates CONFIDENCE in quitting by quit date goal (0-10): 9 ? ?Medications used in past cessation efforts include: none ? ?Most common triggers  to use tobacco include; boredom, being around other smokers, talking on the phone ? ?Motivation to quit: wanting to be around for her grandchildren, cost of cigarettes  ? ?Patient reports wanting to quit by Mother's Day. She wants to quit the morning cigarette first. Patient reports her biggest fear with quitting smoking is gaining weight. ? ?ROS:  ?Cardiovascular - Positive for leg swelling per patient is minimal over the last several years.  ?Bilateral leg swelling minimal on physical exam.   ? ? ?A/P: ?Tobacco use disorder: ?Mild nicotine dependence in a patient who is excellent candidate for success because of motivation to quit and progress thus far.    ?- Provided information on 1 800-QUIT NOW support program. Patient denied wanting to utilize this resource currently but given information in case of reconsideration.  ?- Provided counseling on ways to occupy patient's mind such as reading, walking, and other ways to keep her busy and avoid smoking cigarettes.  ?- Provided counseling on nicotine withdrawal symptoms such as irritability and how to navigate this during her ongoing quit attempt. ?- GOAL: Quit smoking by next Wednesday on May 3. ? ?Chronic Heart Failure / Leg Swelling: ?- Minimal edema, bilateral legs.  Trial of furosemide (Lasix) 40 mg to every other day from every day to assess need for continued chronic diuretic.  ?- Counseled patient to check weight regularly with goal to lose 5 lbs in a month.  ?-  Instructed to return to daily dosing of furosemide if weight increases by > 5 lbs or edema significantly worsens.   ? ?Written information provided.  F/U phone call on May 3.  ?Total time in face-to-face counseling 46 minutes.  Patient seen with Bartolo Darter PharmD Candidate. ?

## 2022-02-12 NOTE — Assessment & Plan Note (Signed)
Tobacco use disorder: ?Mild nicotine dependence in a patient who is excellent candidate for success because of motivation to quit and progress thus far.    ?- Provided information on 1 800-QUIT NOW support program. Patient denied wanting to utilize this resource currently but given information in case of reconsideration.  ?- Provided counseling on ways to occupy patient's mind such as reading, walking, and other ways to keep her busy and avoid smoking cigarettes.  ?- Provided counseling on nicotine withdrawal symptoms such as irritability and how to navigate this during her ongoing quit attempt. ?- GOAL: Quit smoking by next Wednesday on May 3. ? ?

## 2022-02-12 NOTE — Assessment & Plan Note (Signed)
Hemoglobin A1c of 7.1 today, down from 9.4 on 08/07/21 ?-Continue metformin 1000mg  BD ?-Continue glimepiride 8 mg BID ?-check HgA1C in 3 months ?

## 2022-02-12 NOTE — Patient Instructions (Signed)
It was great to see you! Thank you for allowing me to participate in your care! ? ?I recommend that you always bring your medications to each appointment as this makes it easy to ensure you are on the correct medications and helps [place] not miss when refills are needed. ? ?Our plans for today:  ?- Please schedule an appointment with Dr. Valentina Lucks in about 3 months to repeat your lung function test and for a repeat hemoglobin A1c. ? ? ?We are checking some labs today, I will call you if they are abnormal will send you a MyChart message or a letter if they are normal.  If you do not hear about your labs in the next 2 weeks please let us know. ? ?Take care and seek immediate care sooner if you develop any concerns.  ? ?Dr. Precious Gilding, DO ?[facility] ? ?

## 2022-02-12 NOTE — Assessment & Plan Note (Signed)
Advised patient we could increase atorvastatin to 80 mg/day and attempt to reach goal of LDL less than or equal to 70.  Patient was not comfortable with this due to potential side effects and would like to continue on 40 mg/day. ?

## 2022-02-12 NOTE — Assessment & Plan Note (Signed)
Unsure how accurate this diagnosis is currently.  Patient was offered repeat echo today.  However patient states she does not have insurance and cannot afford this test.  As last echo in 2016 was normal I think it is okay for patient to not have echocardiogram at this time.  Of note patient also has diagnosis of moderate mitral regurgitation and moderate aortic regurgitation.  On exam today, there are no murmurs suggestive of regurgitation.  ?

## 2022-02-12 NOTE — Progress Notes (Signed)
Reviewed: I agree with Dr. Koval's documentation and management. 

## 2022-02-13 LAB — BASIC METABOLIC PANEL
BUN/Creatinine Ratio: 11 (ref 9–23)
BUN: 10 mg/dL (ref 6–24)
CO2: 25 mmol/L (ref 20–29)
Calcium: 9.7 mg/dL (ref 8.7–10.2)
Chloride: 103 mmol/L (ref 96–106)
Creatinine, Ser: 0.9 mg/dL (ref 0.57–1.00)
Glucose: 141 mg/dL — ABNORMAL HIGH (ref 70–99)
Potassium: 3.7 mmol/L (ref 3.5–5.2)
Sodium: 144 mmol/L (ref 134–144)
eGFR: 74 mL/min/{1.73_m2} (ref >59)

## 2022-02-21 ENCOUNTER — Telehealth: Payer: Self-pay | Admitting: Pharmacist

## 2022-02-21 NOTE — Telephone Encounter (Signed)
-----   Message from Kathrin Ruddy, RPH-CPP sent at 02/12/2022 11:38 AM EDT ----- ?Regarding: Tobacco Cessation - Quit date...wants to quit by Mother's Day ? ? ?

## 2022-02-21 NOTE — Telephone Encounter (Signed)
Attempted to contact patient for follow-up of tobacco intake reduction / cessation X2.  ? ? ?Left HIPAA compliant voice mail requesting call back to direct phone: [phone] ? ? ?Total time with patient call and documentation of interaction: 6 minutes. ? ?Additional F/U Phone call planned: 4-5 days ? ?

## 2022-02-27 ENCOUNTER — Telehealth: Payer: Self-pay | Admitting: Pharmacist

## 2022-02-27 NOTE — Telephone Encounter (Signed)
Patient contacted for follow/up of tobacco intake reduction / tobacco cessation attempt.  ? ?Since last contact patient reports she has made no change in tobacco intake. Continues to smoke ~ 5 cigarettes per day.  ? ?Tobacco Cessation Medications currently being used; none ? ?Recently discussed a trial of less frequent qod dosing of furosemide.  Patient reports increase in dyspnea and swelling.  She denies any dyspnea currently - she is currently able to work, and speak in full sentences. We agreed that she should return to furosemide 40mg  daily.  She was instructed that if dyspnea or swelling continued or worsened, she should contact our office in two days to be evaluated.  ? ?Total time with patient call and documentation of interaction: 14 minutes. ? ?F/U Phone call planned: 1 week to assess smoking cessation progress, dyspnea and swelling.  ? ? ?

## 2022-02-27 NOTE — Telephone Encounter (Signed)
Noted and agree. 

## 2022-02-27 NOTE — Telephone Encounter (Signed)
-----   Message from Kathrin Ruddy, RPH-CPP sent at 02/21/2022  1:17 PM EDT ----- ?Regarding: Tobacco Cessation by Mother's day ? ? ?

## 2022-03-12 ENCOUNTER — Telehealth: Payer: Self-pay | Admitting: Pharmacist

## 2022-03-12 DIAGNOSIS — F17200 Nicotine dependence, unspecified, uncomplicated: Secondary | ICD-10-CM

## 2022-03-12 NOTE — Assessment & Plan Note (Signed)
Patient returned a call attempt from me on 5/18   Since last contact patient reports reduced leg swelling, less dyspnea and has been taking daily furosemide.    She also reports a fall that resulted for her being out of work for several days.  She has now returned to work.  The fall was stressful for her.  She continues to smoke at this time. However, she intends to quit completely by [facility] day in 1 week.    Medications currently being used; None -desires to quit without medication assistance.    Rates IMPORTANCE of quitting tobacco as high. Rates CONFIDENCE of quitting tobacco as high.   Most common triggers to use tobacco include; STRESS  Motivation to quit: Breathing

## 2022-03-12 NOTE — Telephone Encounter (Signed)
Patient returned a call attempt from me on 5/18   Since last contact patient reports reduced leg swelling, less dyspnea and has been taking daily furosemide.    She also reports a fall that resulted for her being out of work for several days.  She has now returned to work.  The fall was stressful for her.  She continues to smoke at this time. However, she intends to quit completely by [facility] day in 1 week.    Medications currently being used; None -desires to quit without medication assistance.    Rates IMPORTANCE of quitting tobacco as high. Rates CONFIDENCE of quitting tobacco as high.   Most common triggers to use tobacco include; STRESS  Motivation to quit: Breathing  Total time with patient call and documentation of interaction: 11 minutes.  F/U Phone call planned: 2 weeks.

## 2022-03-12 NOTE — Telephone Encounter (Signed)
-----   Message from Kathrin Ruddy, RPH-CPP sent at 02/27/2022  3:30 PM EDT ----- Regarding: Tobacco Cessation / reduction and swelling/dyspnea

## 2022-03-12 NOTE — Telephone Encounter (Signed)
Noted and agree. 

## 2022-03-26 ENCOUNTER — Encounter: Payer: Self-pay | Admitting: *Deleted

## 2022-04-02 ENCOUNTER — Telehealth: Payer: Self-pay | Admitting: Pharmacist

## 2022-04-02 DIAGNOSIS — F17200 Nicotine dependence, unspecified, uncomplicated: Secondary | ICD-10-CM

## 2022-04-02 NOTE — Telephone Encounter (Signed)
Patient returned call and reported that she has significant emotional stress.   Working and Walking in the neighborhood has helped her with stress reduction.  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   I shared that would like to see her schedule back by mid-July if possible.  She is aware I will call her in 1 month to reassess stressors.

## 2022-04-02 NOTE — Assessment & Plan Note (Signed)
Patient contacted for follow/up of tobacco intake reduction / tobacco cessation attempt.   Since last contact patient reports she has not been doing well.  She reports she had a fall two weeks ago which was stressful.  More recently her daughter had emergency breast surgery and a diagnosis of breast cancer is being discussed.  The patient's daughter is still in the hospital as this time.  Patient states her recent stresses have lead to a return in smoking.   She was unable to talk at length and asked if she could call back. I encouraged her to do so.

## 2022-04-02 NOTE — Telephone Encounter (Signed)
Noted and agree. 

## 2022-04-02 NOTE — Telephone Encounter (Signed)
-----   Message from Kathrin Ruddy, RPH-CPP sent at 03/12/2022  8:13 AM EDT ----- Regarding: Tobacco Cessation Quit plan was for memorial day

## 2022-04-02 NOTE — Telephone Encounter (Signed)
Patient contacted for follow/up of tobacco intake reduction / tobacco cessation attempt.   Since last contact patient reports she has not been doing well.  She reports she had a fall two weeks ago which was stressful.  More recently her daughter had emergency breast surgery and a diagnosis of breast cancer is being discussed.  The patient's daughter is still in the hospital as this time.  Patient states her recent stresses have lead to a return in smoking.   She was unable to talk at length and asked if she could call back. I encouraged her to do so.   Total time with patient call and documentation of interaction: 11 minutes.  F/U Phone call planned: in 7-10 days if I do not hear back from her later today.

## 2022-04-07 ENCOUNTER — Other Ambulatory Visit: Payer: Self-pay | Admitting: Student

## 2022-04-07 ENCOUNTER — Inpatient Hospital Stay (HOSPITAL_COMMUNITY): Payer: No Typology Code available for payment source

## 2022-04-07 ENCOUNTER — Emergency Department (HOSPITAL_COMMUNITY): Payer: No Typology Code available for payment source

## 2022-04-07 ENCOUNTER — Encounter (HOSPITAL_COMMUNITY): Payer: Self-pay

## 2022-04-07 ENCOUNTER — Other Ambulatory Visit: Payer: Self-pay

## 2022-04-07 ENCOUNTER — Observation Stay (HOSPITAL_COMMUNITY)
Admission: EM | Admit: 2022-04-07 | Discharge: 2022-04-09 | Disposition: A | Discharge status: Home / Self Care | Payer: Self-pay | Attending: Family Medicine | Admitting: Family Medicine

## 2022-04-07 DIAGNOSIS — Z7984 Long term (current) use of oral hypoglycemic drugs: Secondary | ICD-10-CM | POA: Insufficient documentation

## 2022-04-07 DIAGNOSIS — I10 Essential (primary) hypertension: Secondary | ICD-10-CM | POA: Diagnosis present

## 2022-04-07 DIAGNOSIS — Z8673 Personal history of transient ischemic attack (TIA), and cerebral infarction without residual deficits: Secondary | ICD-10-CM | POA: Insufficient documentation

## 2022-04-07 DIAGNOSIS — E876 Hypokalemia: Secondary | ICD-10-CM | POA: Insufficient documentation

## 2022-04-07 DIAGNOSIS — E119 Type 2 diabetes mellitus without complications: Secondary | ICD-10-CM

## 2022-04-07 DIAGNOSIS — I5022 Chronic systolic (congestive) heart failure: Secondary | ICD-10-CM | POA: Diagnosis present

## 2022-04-07 DIAGNOSIS — Z79899 Other long term (current) drug therapy: Secondary | ICD-10-CM | POA: Insufficient documentation

## 2022-04-07 DIAGNOSIS — F17200 Nicotine dependence, unspecified, uncomplicated: Secondary | ICD-10-CM | POA: Diagnosis present

## 2022-04-07 DIAGNOSIS — I110 Hypertensive heart disease with heart failure: Secondary | ICD-10-CM | POA: Insufficient documentation

## 2022-04-07 DIAGNOSIS — I639 Cerebral infarction, unspecified: Principal | ICD-10-CM | POA: Diagnosis present

## 2022-04-07 LAB — COMPREHENSIVE METABOLIC PANEL
ALT: 9 U/L (ref 0–44)
AST: 23 U/L (ref 15–41)
Albumin: 3.6 g/dL (ref 3.5–5.0)
Alkaline Phosphatase: 65 U/L (ref 38–126)
Anion gap: 13 (ref 5–15)
BUN: 7 mg/dL (ref 6–20)
CO2: 26 mmol/L (ref 22–32)
Calcium: 9.7 mg/dL (ref 8.9–10.3)
Chloride: 103 mmol/L (ref 98–111)
Creatinine, Ser: 0.89 mg/dL (ref 0.44–1.00)
GFR, Estimated: >60 mL/min (ref >60)
Glucose, Bld: 224 mg/dL — ABNORMAL HIGH (ref 70–99)
Potassium: 3.4 mmol/L — ABNORMAL LOW (ref 3.5–5.1)
Sodium: 142 mmol/L (ref 135–145)
Total Bilirubin: 0.9 mg/dL (ref 0.3–1.2)
Total Protein: 6.6 g/dL (ref 6.5–8.1)

## 2022-04-07 LAB — CBC
HCT: 33.8 % — ABNORMAL LOW (ref 36.0–46.0)
Hemoglobin: 11.1 g/dL — ABNORMAL LOW (ref 12.0–15.0)
MCH: 25.3 pg — ABNORMAL LOW (ref 26.0–34.0)
MCHC: 32.8 g/dL (ref 30.0–36.0)
MCV: 77 fL — ABNORMAL LOW (ref 80.0–100.0)
Platelets: 225 10*3/uL (ref 150–400)
RBC: 4.39 MIL/uL (ref 3.87–5.11)
RDW: 16.6 % — ABNORMAL HIGH (ref 11.5–15.5)
WBC: 7.4 10*3/uL (ref 4.0–10.5)
nRBC: 0 % (ref 0.0–0.2)

## 2022-04-07 LAB — LIPASE, BLOOD: Lipase: 34 U/L (ref 11–51)

## 2022-04-07 LAB — CBG MONITORING, ED: Glucose-Capillary: 225 mg/dL — ABNORMAL HIGH (ref 70–99)

## 2022-04-07 LAB — MAGNESIUM: Magnesium: 1.4 mg/dL — ABNORMAL LOW (ref 1.7–2.4)

## 2022-04-07 IMAGING — MR MR MRA HEAD W/O CM
2 series · 22 of 48 positions shown · non-contrast
Comparison: No prior MRI, correlation is made with CT head
[DATE] and [DATE]

CLINICAL DATA: Dizziness, headache

EXAM:
MRI HEAD WITHOUT CONTRAST
MRA HEAD WITHOUT CONTRAST
TECHNIQUE: Multiplanar, multi-echo pulse sequences of the brain and surrounding
structures were acquired without intravenous contrast. Angiographic
images of the Circle of Willis were acquired using MRA technique
without intravenous contrast.

[Series 9: 3d cow · axial · 0.6mm · 0.41mm/px · z∈[-41,+56]mm · 12 of 172 slices shown (1 of 2)]
[im 1/172]
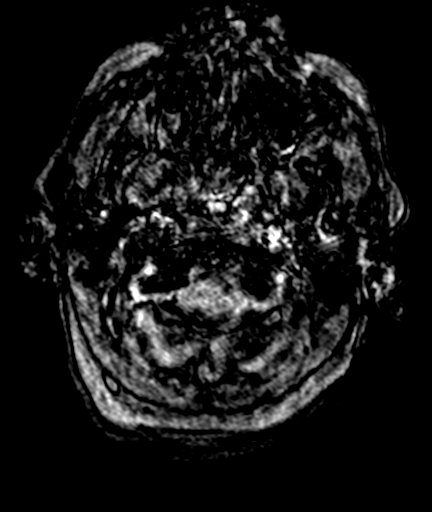
[im 8/172]
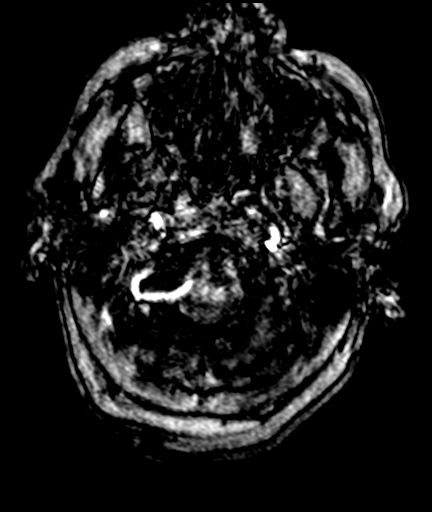
[im 15/172]
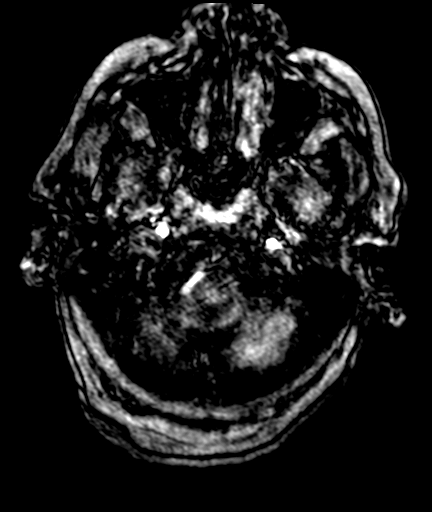
[im 30/172]
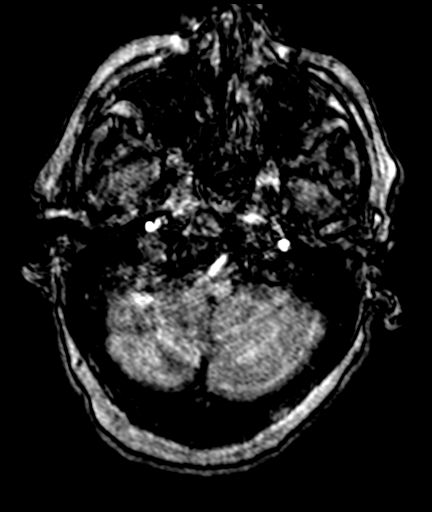
[im 53/172]
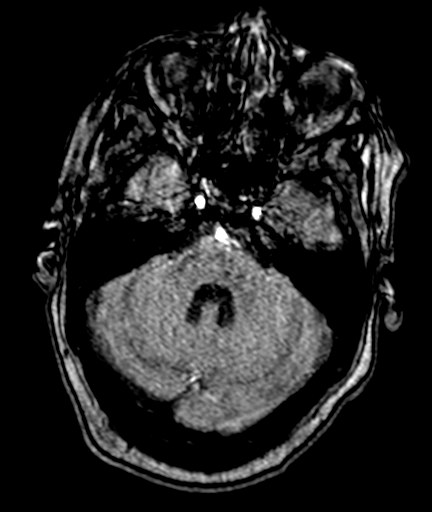
[im 75/172]
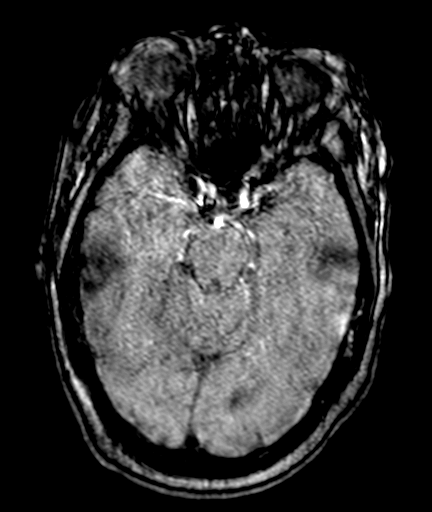
[im 90/172]
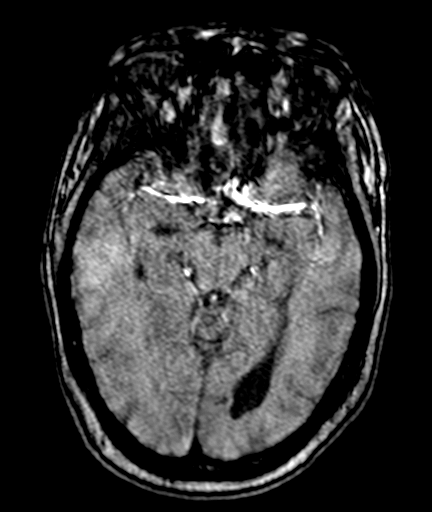
[im 97/172]
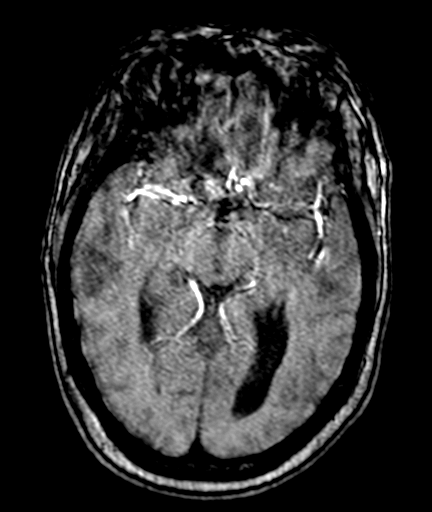
[im 119/172]
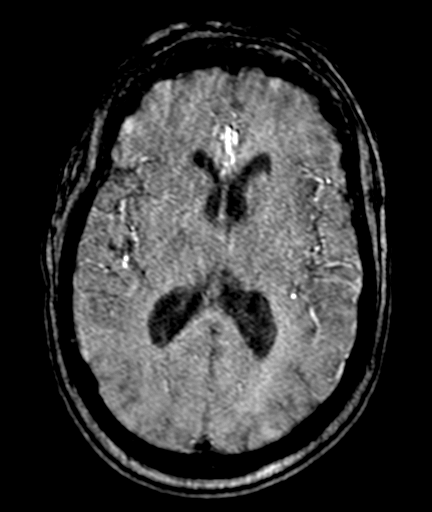
[im 142/172]
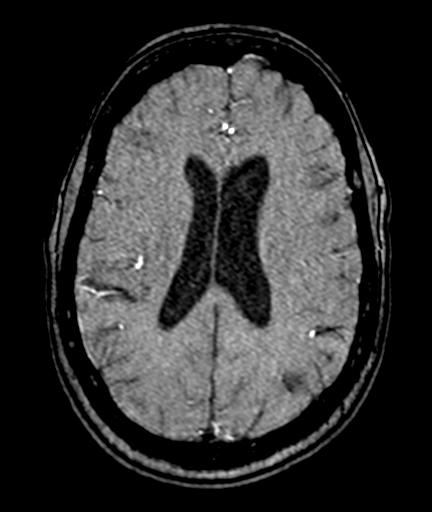
[im 149/172]
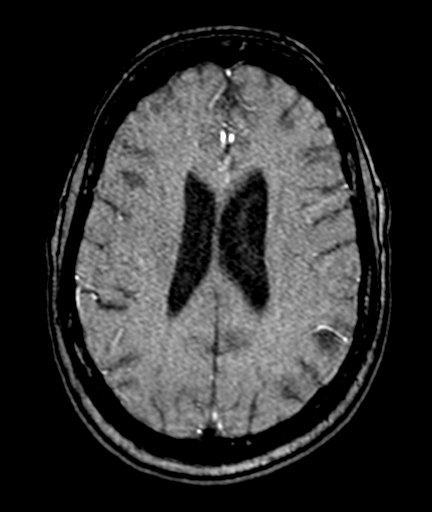
[im 164/172]
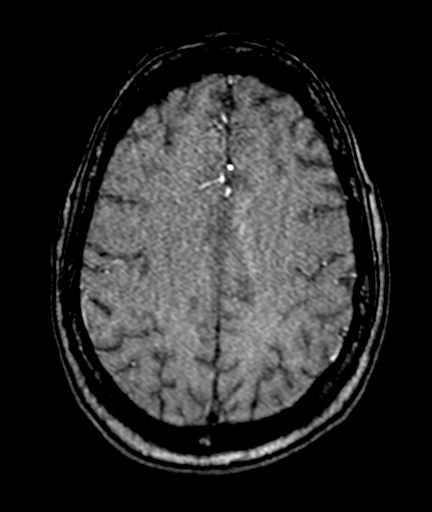

[Series 13: 3d cow · axial · 0.6mm · 0.41mm/px · z∈[-37,+56]mm · 10 of 172 slices shown (2 of 2)]
[im 8/172]
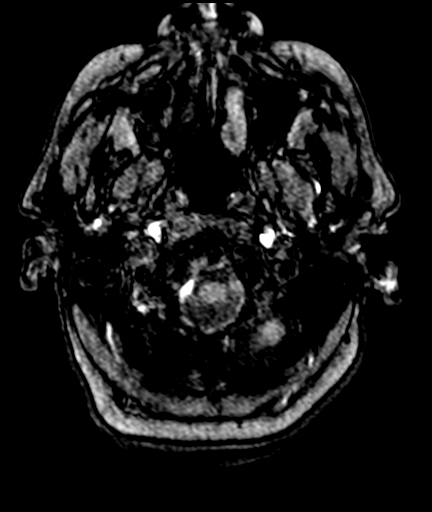
[im 30/172]
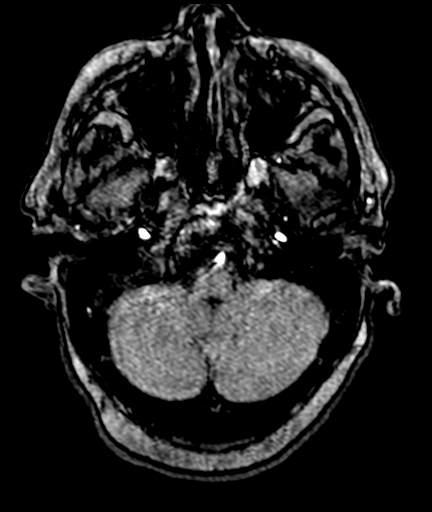
[im 53/172]
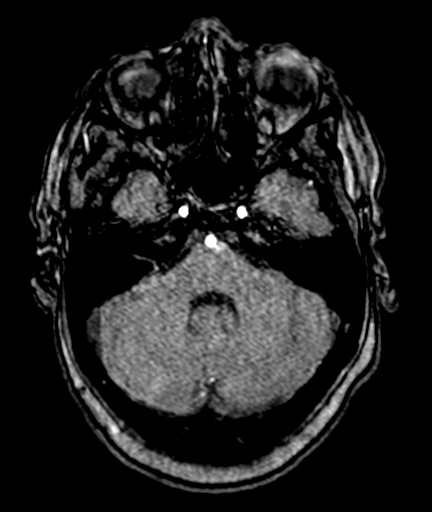
[im 75/172]
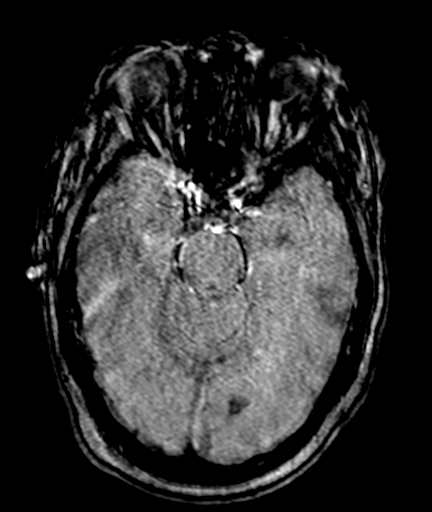
[im 90/172]
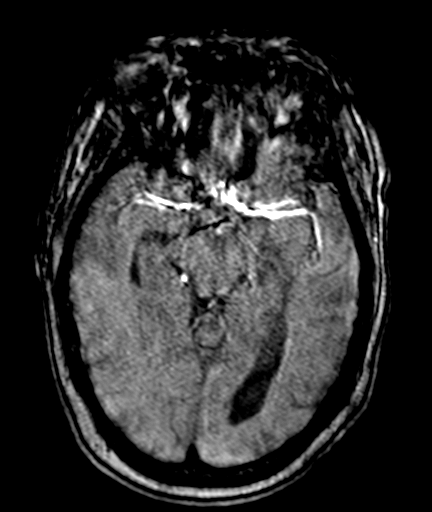
[im 97/172]
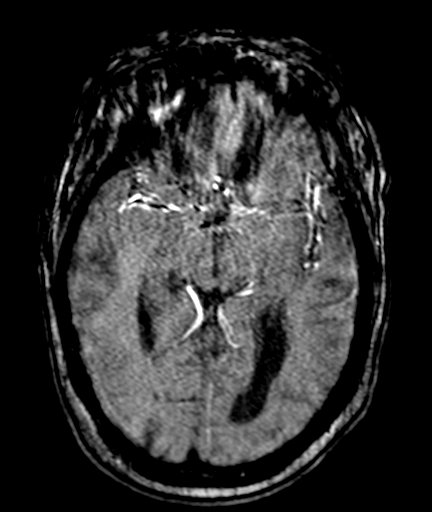
[im 119/172]
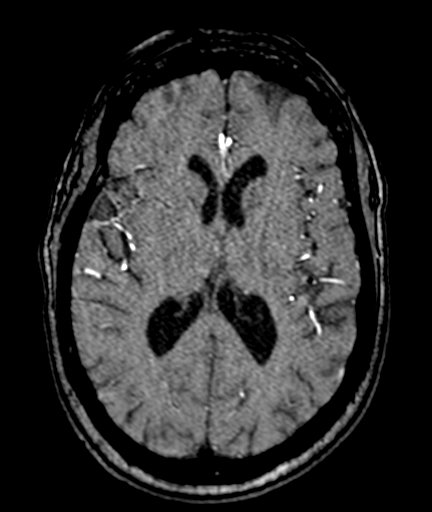
[im 142/172]
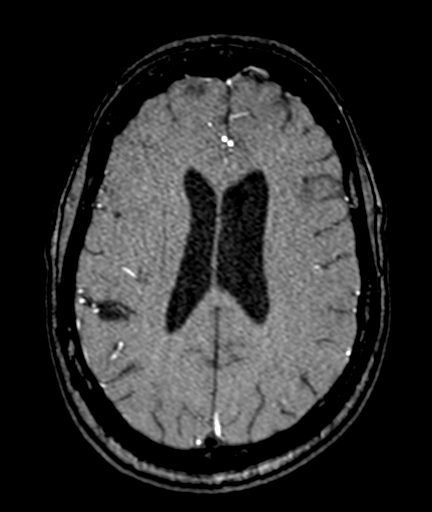
[im 149/172]
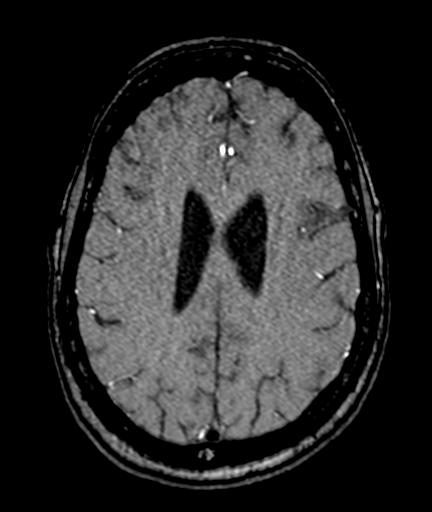
[im 164/172]
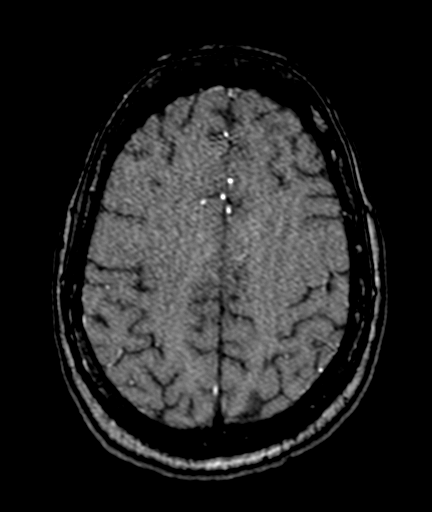

[22 of 48 positions shown; findings below may reference images not displayed]

FINDINGS: Evaluation is limited by motion artifact. In addition, the patient
terminated the MRI early, after acquisition of only the
diffusion-weighted and FLAIR sequences, which are motion degraded.

MRI HEAD FINDINGS

Restricted diffusion with ADC correlate in the medial right
cerebellum (series 13, images 52-58 and series 11, image 51) and
posterior left cerebellum (series 13, images 50-56 and series 11,
images 46-48).

MRA HEAD FINDINGS

Anterior circulation: Both internal carotid arteries are patent to
the cavernous portions, without significant stenosis; the
supraclinoid portions are significantly motion degraded.

A1 segments, anterior communicating artery, and proximal ACAs are
motion degraded. The distal ACAs appear patent.

M1 segments are motion degraded but grossly patent. Evaluation of
more distal MCAs is limited.

Posterior circulation: The right vertebral artery is patent to the
vertebrobasilar junction. The left vertebral artery is not seen.
Posterior inferior cerebral arteries not visualized.

Basilar is grossly patent to its distal aspect. Superior cerebellar
arteries visualized.

Proximal PCAs are grossly patent.

Anatomic variants: None appreciated.
IMPRESSION: Evaluation is significantly limited by motion artifact and early
termination of the exam at the patient's request. Within this
limitation, there are acute infarcts in the inferomedial right
cerebellum and posteroinferior left cerebellum.

The left vertebral artery is not visualized, which is not favored to
be artifactual but is of indeterminate acuity. Evaluation for the
PICAs is limited by motion. Consider CTA head and neck for further
evaluation.

These results were called by telephone at the time of interpretation
on [DATE] at [DATE] to provider ALAMU , who verbally
acknowledged these results.

## 2022-04-07 IMAGING — CT CT HEAD W/O CM
3 series · 15 of 47 positions shown, 18 images · non-contrast
Comparison: CT head [DATE]

CLINICAL DATA: Dizziness



[Series 4: head 5.0 h30s · axial · 0.45mm/px · z∈[-111,+29]mm · 9 of 34 slices shown, 12 images]
[im 3/34  brain]
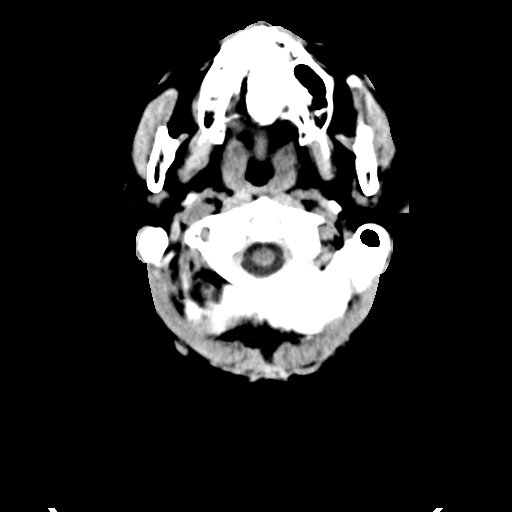
[im 3/34  bone]
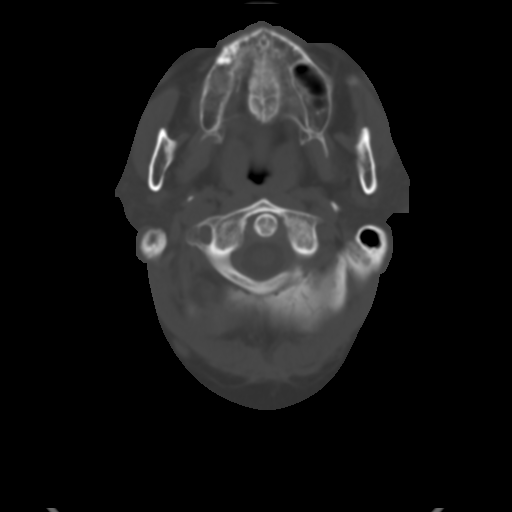
[im 6/34  brain]
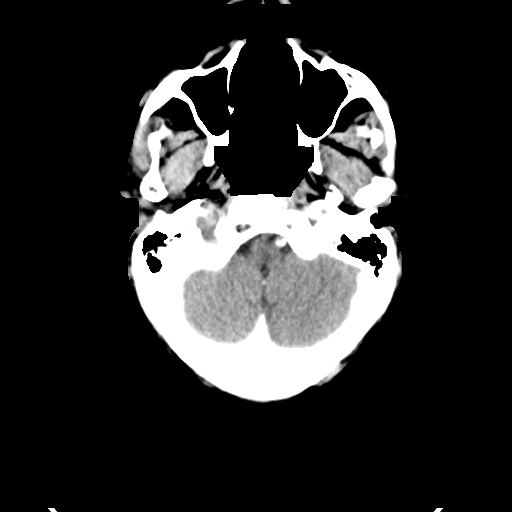
[im 10/34  brain]
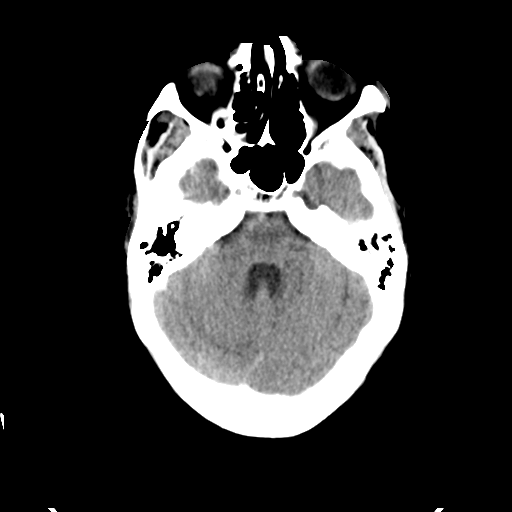
[im 13/34  brain]
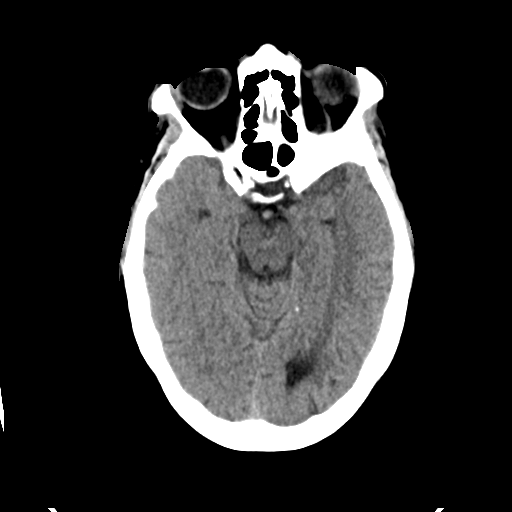
[im 18/34  brain]
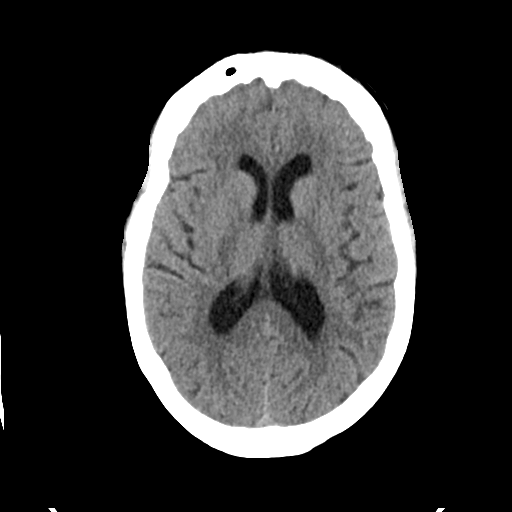
[im 18/34  bone]
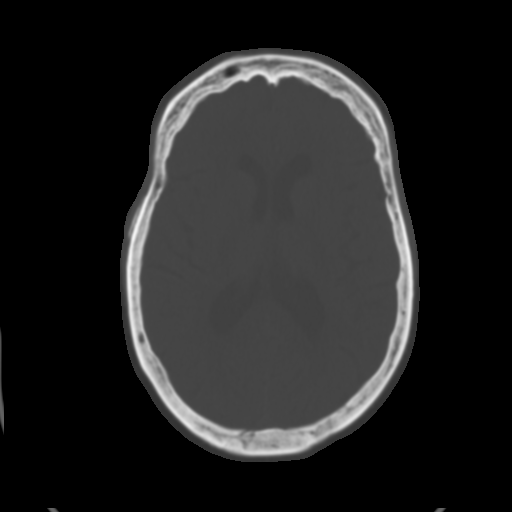
[im 21/34  brain]
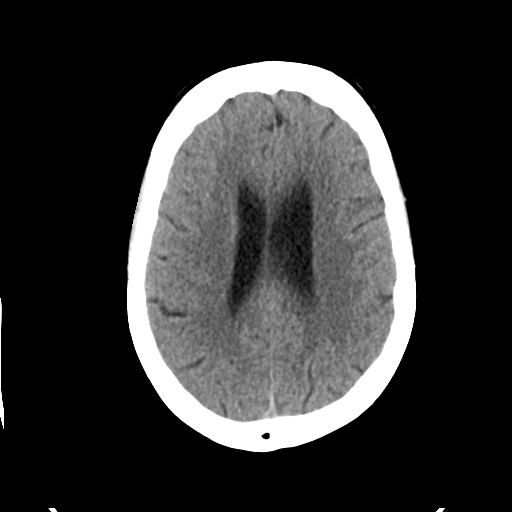
[im 24/34  brain]
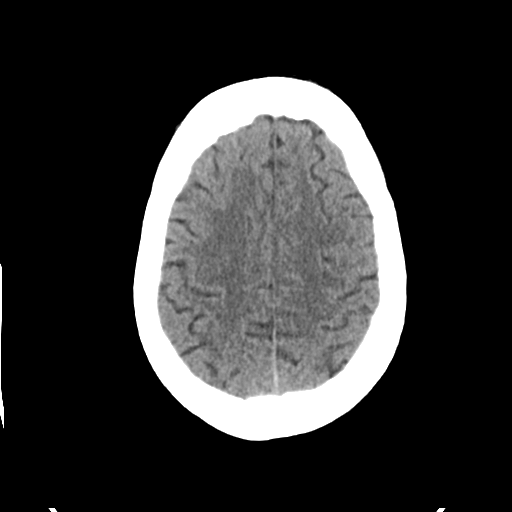
[im 28/34  brain]
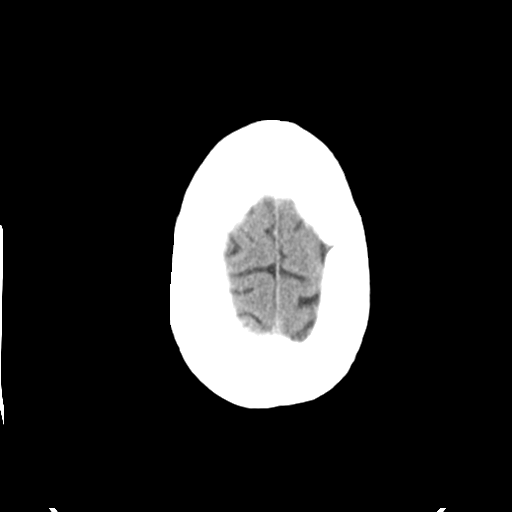
[im 31/34  brain]
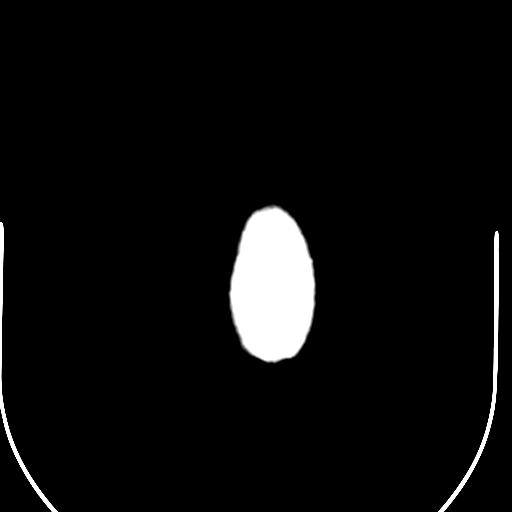
[im 31/34  bone]
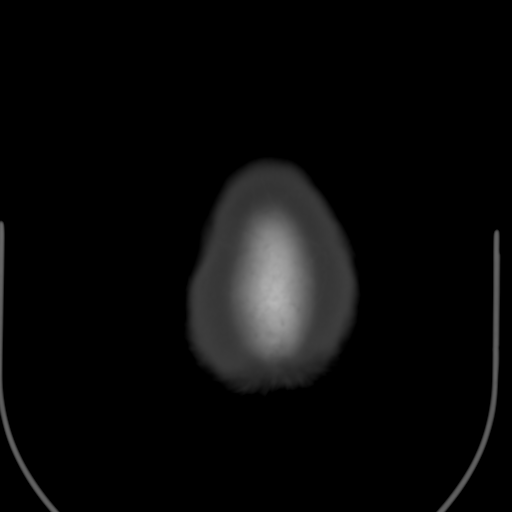

[Series 5: head 3.0 mpr cor · coronal · 0.31mm/px · 3 of 69 slices shown]
[im 23/69  brain]
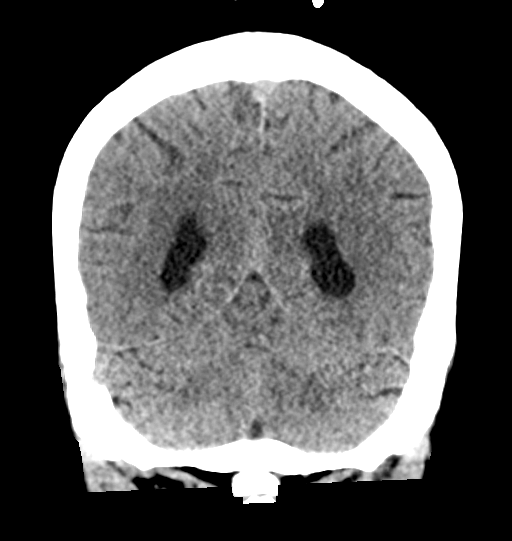
[im 31/69  brain]
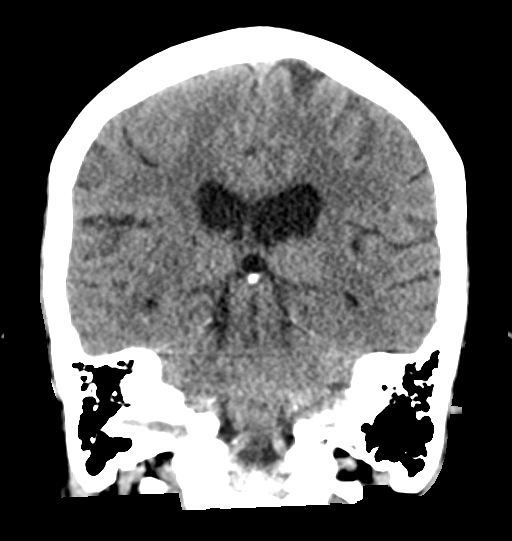
[im 38/69  brain]
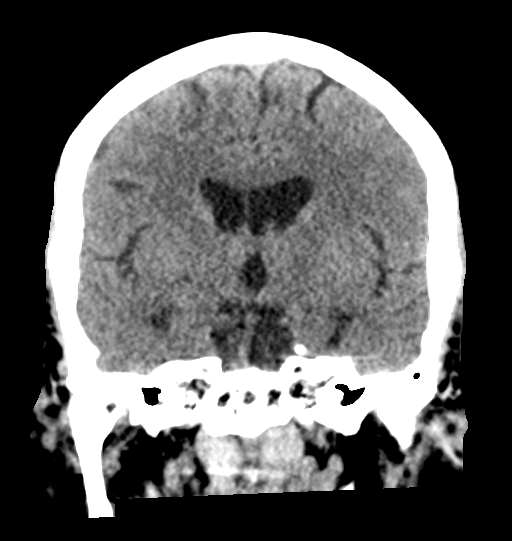

[Series 6: head 3.0 mpr sag · sagittal · 0.32mm/px · 3 of 53 slices shown]
[im 18/53  brain]
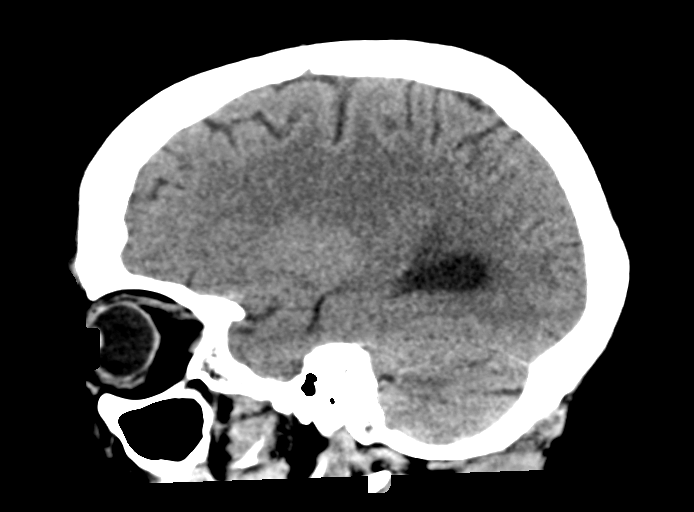
[im 27/53  brain]
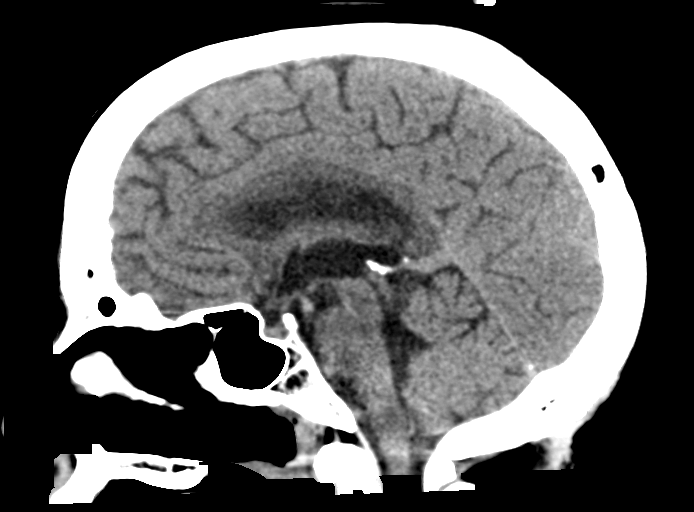
[im 35/53  brain]
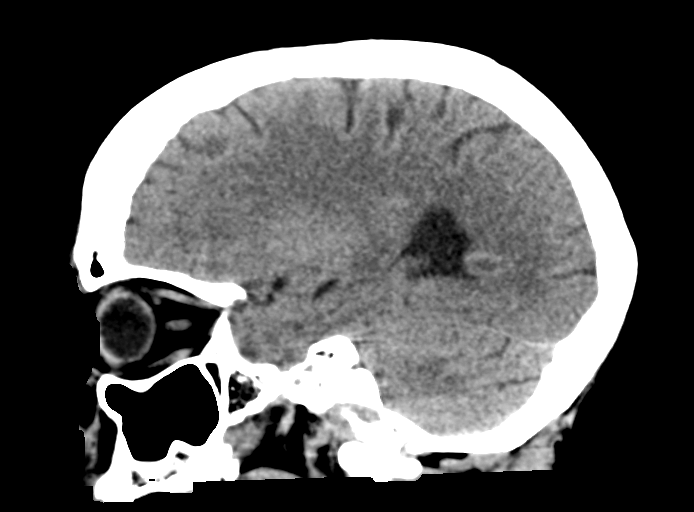

[15 of 47 positions shown; findings below may reference images not displayed]

FINDINGS: Brain: No acute intracranial hemorrhage, mass effect, or herniation.
No extra-axial fluid collections. No evidence of acute territorial
infarct. No hydrocephalus.

Vascular: No hyperdense vessel or unexpected calcification.

Skull: Normal. Negative for fracture or focal lesion.

Sinuses/Orbits: No acute finding.

Other: None.
IMPRESSION: No acute intracranial process identified.

## 2022-04-07 IMAGING — MR MR HEAD W/O CM
5 series · 48 of 48 positions shown · non-contrast
Comparison: No prior MRI, correlation is made with CT head
[DATE] and [DATE]

CLINICAL DATA: Dizziness, headache

EXAM:
MRI HEAD WITHOUT CONTRAST
MRA HEAD WITHOUT CONTRAST
TECHNIQUE: Multiplanar, multi-echo pulse sequences of the brain and surrounding
structures were acquired without intravenous contrast. Angiographic
images of the Circle of Willis were acquired using MRA technique
without intravenous contrast.

[Series 11: DWI · coronal · 4.0mm · 0.88mm/px · 13 of 72 slices shown (1 of 4)]
[im 1/72]
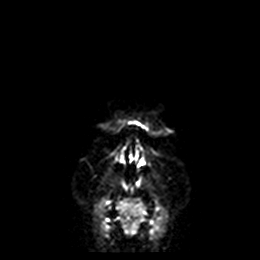
[im 6/72]
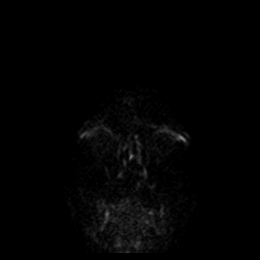
[im 12/72]
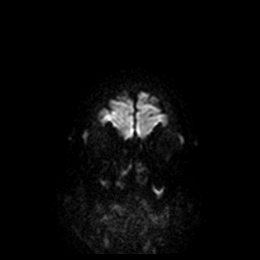
[im 18/72]
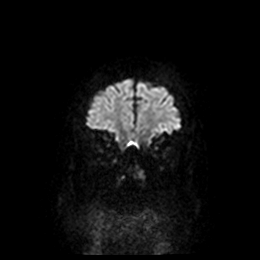
[im 24/72]
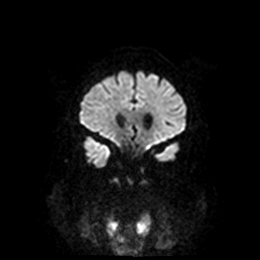
[im 30/72]
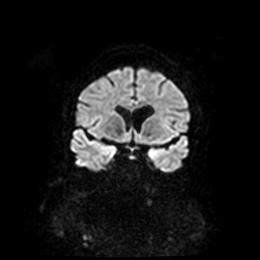
[im 36/72]
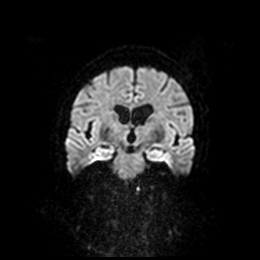
[im 42/72]
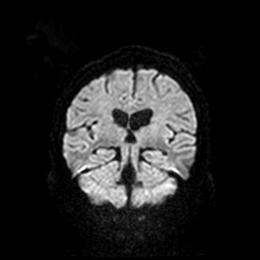
[im 48/72]
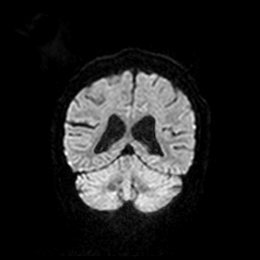
[im 54/72]
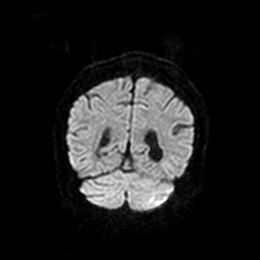
[im 60/72]
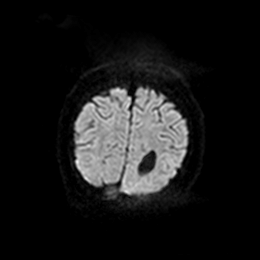
[im 66/72]
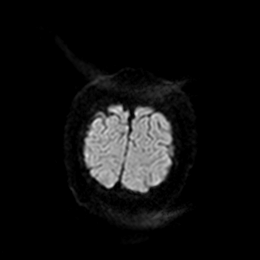
[im 72/72]
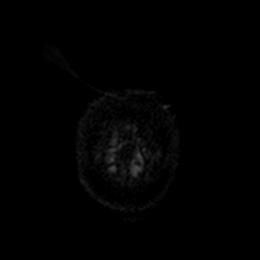

[Series 12: DWI · coronal · 4.0mm · 0.88mm/px · 6 of 36 slices shown (2 of 4)]
[im 1/36]
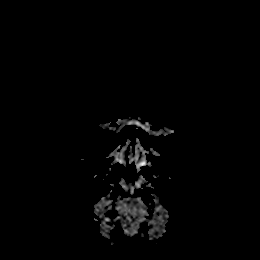
[im 8/36]
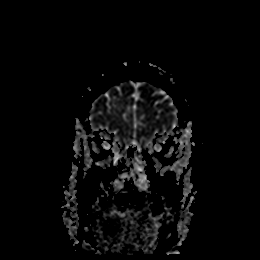
[im 15/36]
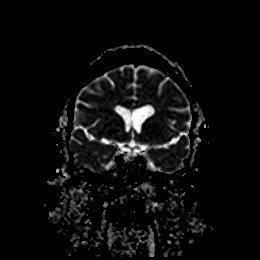
[im 22/36]
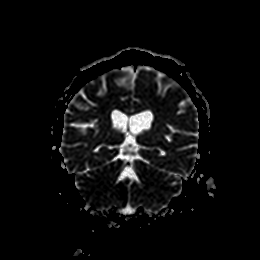
[im 29/36]
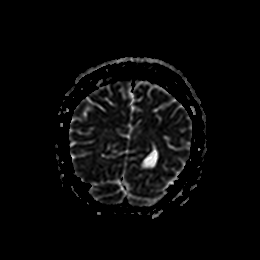
[im 36/36]
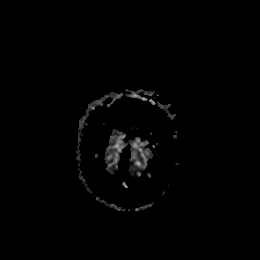

[Series 13: DWI · axial · 3.0mm · 0.88mm/px · z∈[-40,+101]mm · 17 of 96 slices shown (3 of 4)]
[im 1/96]
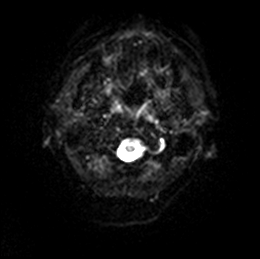
[im 6/96]
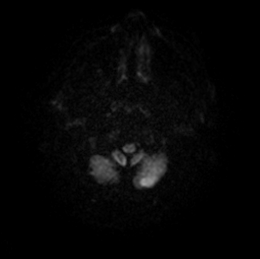
[im 12/96]
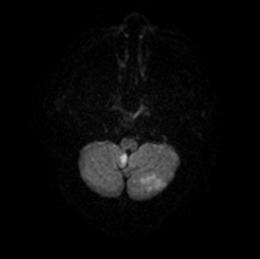
[im 18/96]
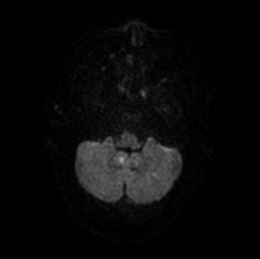
[im 24/96]
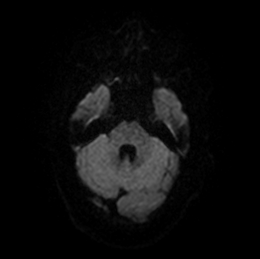
[im 30/96]
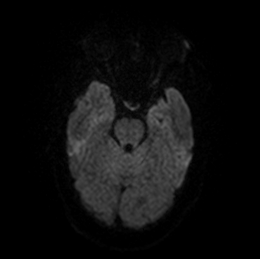
[im 36/96]
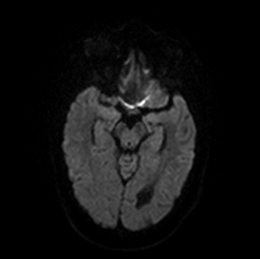
[im 42/96]
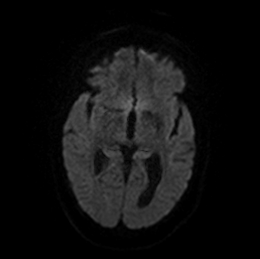
[im 48/96]
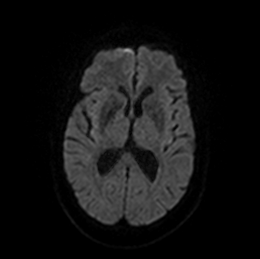
[im 54/96]
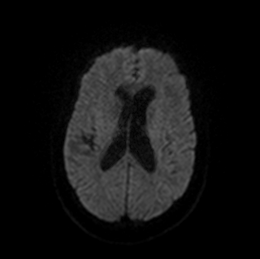
[im 60/96]
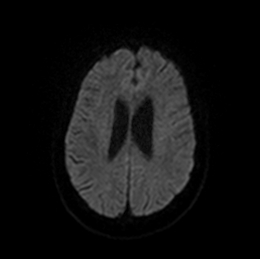
[im 66/96]
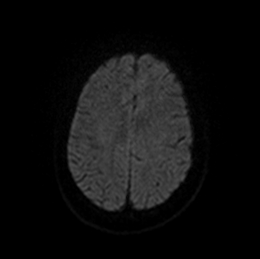
[im 72/96]
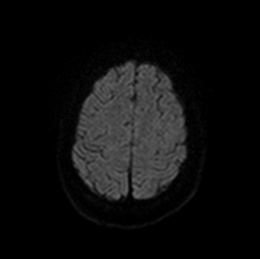
[im 78/96]
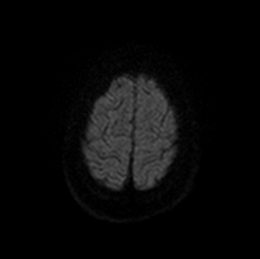
[im 84/96]
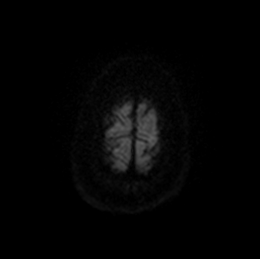
[im 90/96]
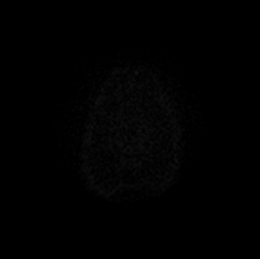
[im 96/96]
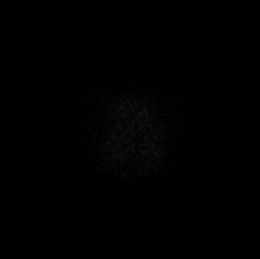

[Series 14: DWI · axial · 3.0mm · 0.88mm/px · z∈[-40,+101]mm · 8 of 48 slices shown (4 of 4)]
[im 1/48]
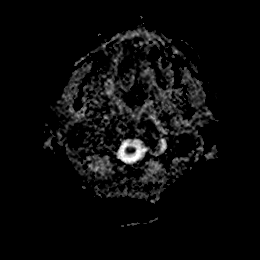
[im 7/48]
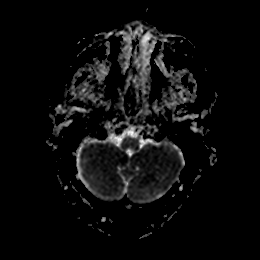
[im 14/48]
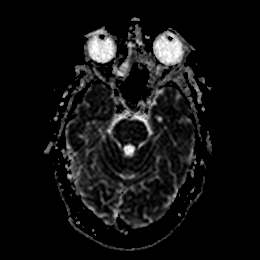
[im 21/48]
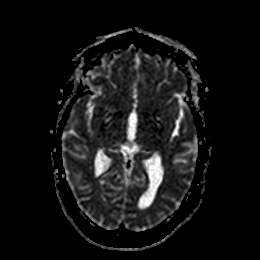
[im 27/48]
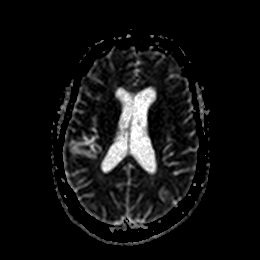
[im 34/48]
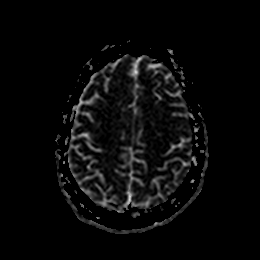
[im 41/48]
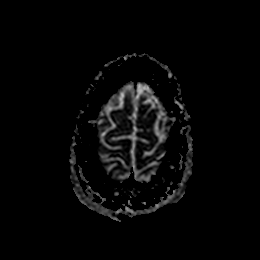
[im 48/48]
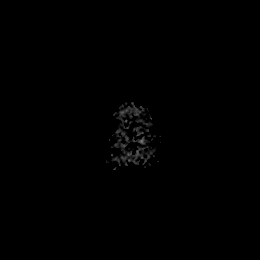

[Series 17: FLAIR · axial · 5.0mm · 0.45mm/px · z∈[-41,+103]mm · 4 of 25 slices shown]
[im 1/25]
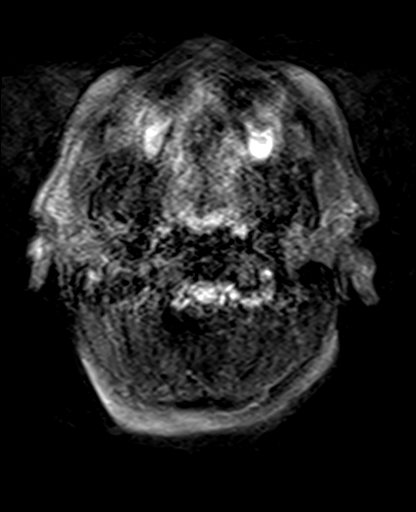
[im 9/25]
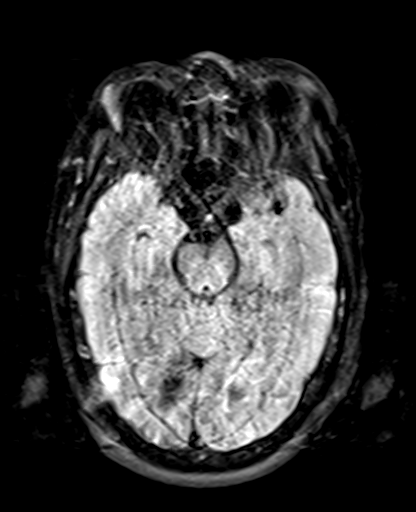
[im 17/25]
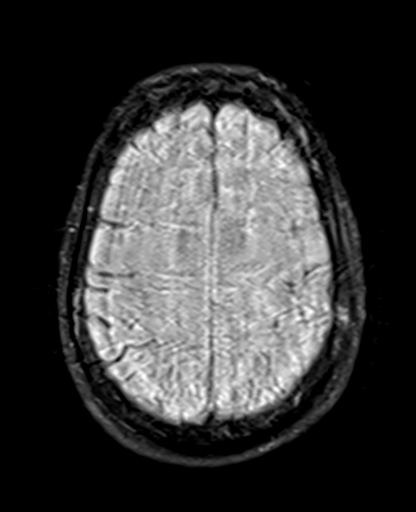
[im 25/25]
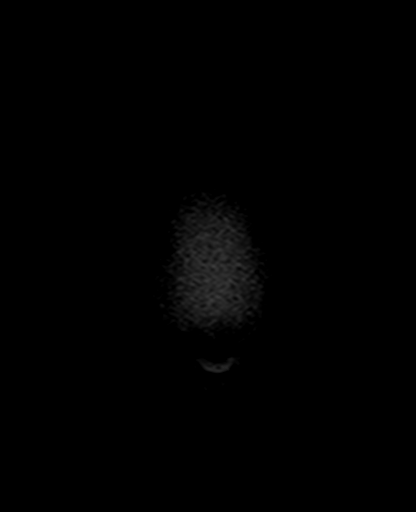

[48 of 48 positions shown; findings below may reference images not displayed]

FINDINGS: Evaluation is limited by motion artifact. In addition, the patient
terminated the MRI early, after acquisition of only the
diffusion-weighted and FLAIR sequences, which are motion degraded.

MRI HEAD FINDINGS

Restricted diffusion with ADC correlate in the medial right
cerebellum (series 13, images 52-58 and series 11, image 51) and
posterior left cerebellum (series 13, images 50-56 and series 11,
images 46-48).

MRA HEAD FINDINGS

Anterior circulation: Both internal carotid arteries are patent to
the cavernous portions, without significant stenosis; the
supraclinoid portions are significantly motion degraded.

A1 segments, anterior communicating artery, and proximal ACAs are
motion degraded. The distal ACAs appear patent.

M1 segments are motion degraded but grossly patent. Evaluation of
more distal MCAs is limited.

Posterior circulation: The right vertebral artery is patent to the
vertebrobasilar junction. The left vertebral artery is not seen.
Posterior inferior cerebral arteries not visualized.

Basilar is grossly patent to its distal aspect. Superior cerebellar
arteries visualized.

Proximal PCAs are grossly patent.

Anatomic variants: None appreciated.
IMPRESSION: Evaluation is significantly limited by motion artifact and early
termination of the exam at the patient's request. Within this
limitation, there are acute infarcts in the inferomedial right
cerebellum and posteroinferior left cerebellum.

The left vertebral artery is not visualized, which is not favored to
be artifactual but is of indeterminate acuity. Evaluation for the
PICAs is limited by motion. Consider CTA head and neck for further
evaluation.

These results were called by telephone at the time of interpretation
on [DATE] at [DATE] to provider ALAMU , who verbally
acknowledged these results.

## 2022-04-07 IMAGING — CT CT ANGIO HEAD-NECK (W OR W/O PERF)
3 of 7 series · 10 of 36 positions shown · IV contrast (APPLIED)
Comparison: No prior CTA, correlation is made with CT head
[DATE] and MRI and MRA head [DATE]

CLINICAL DATA: Dizziness, headache, acute infarct on same-day MRI

EXAM:
CT ANGIOGRAPHY HEAD AND NECK
TECHNIQUE: Multidetector CT imaging of the head and neck was performed using
the standard protocol during bolus administration of intravenous
contrast. Multiplanar CT image reconstructions and MIPs were
obtained to evaluate the vascular anatomy. Carotid stenosis
measurements (when applicable) are obtained utilizing NASCET
criteria, using the distal internal carotid diameter as the
denominator.

[Series 6: cta neck/head · axial · 0.50mm/px · z∈[-163,-59]mm · 2 of 156 slices shown]
[im 52/156  soft-tissue]
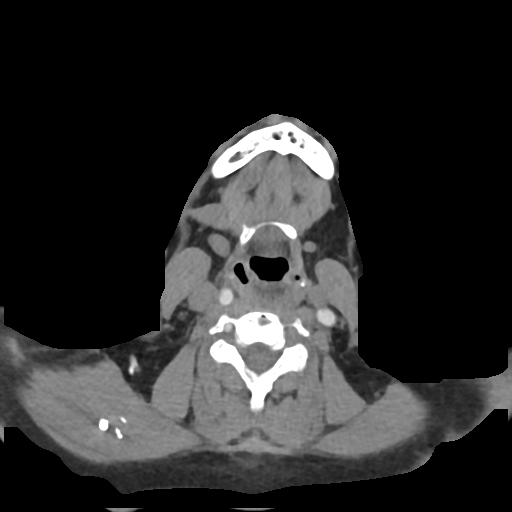
[im 104/156  soft-tissue]
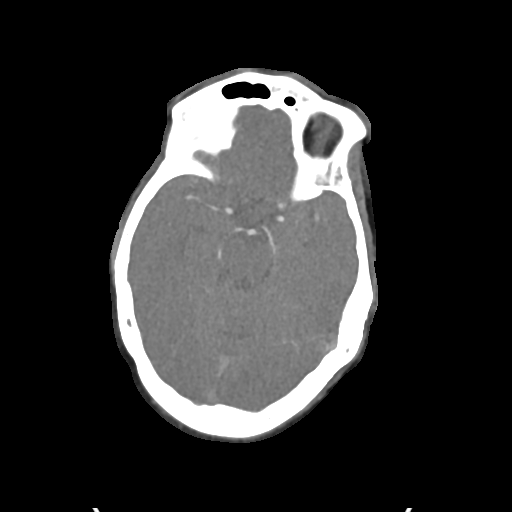

[Series 8: ax thins · axial · 0.39mm/px · z∈[-251,-33]mm · 6 of 312 slices shown]
[im 45/312  soft-tissue]
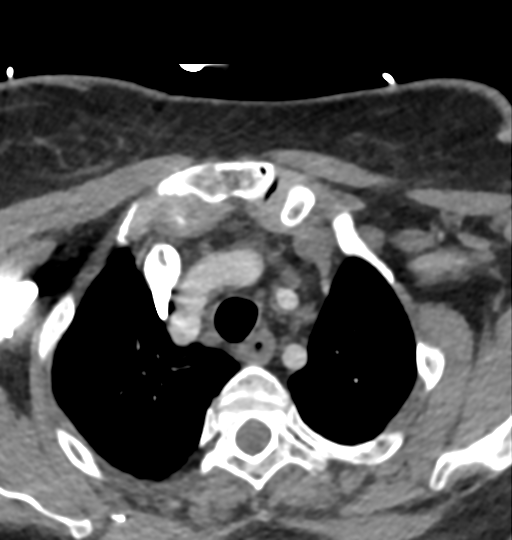
[im 89/312  bone]
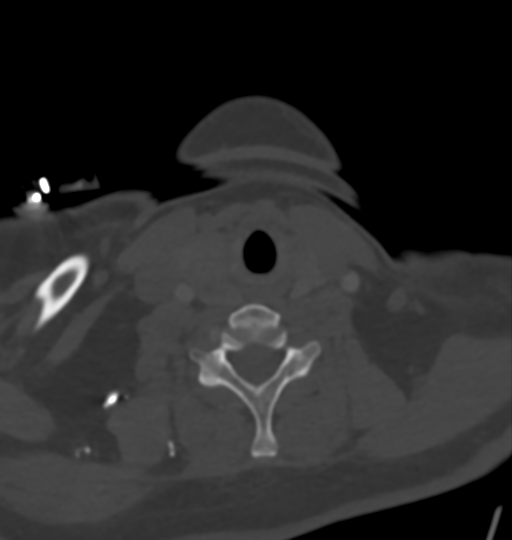
[im 134/312  soft-tissue]
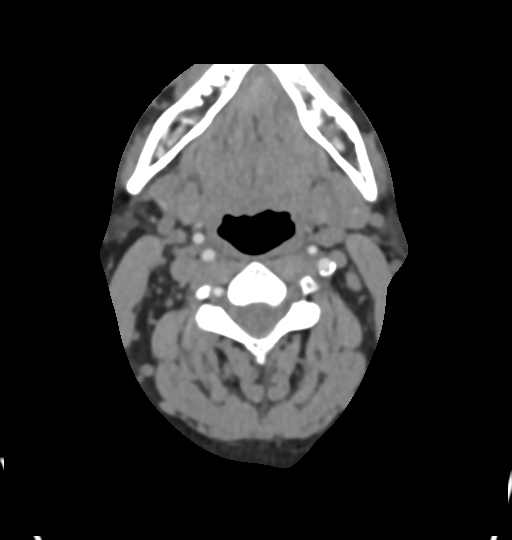
[im 178/312  bone]
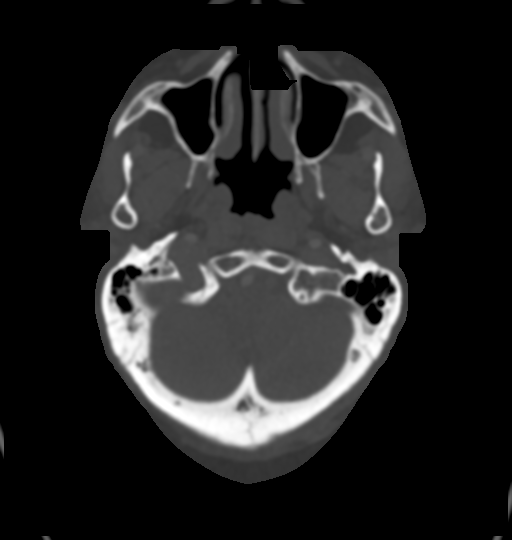
[im 223/312  soft-tissue]
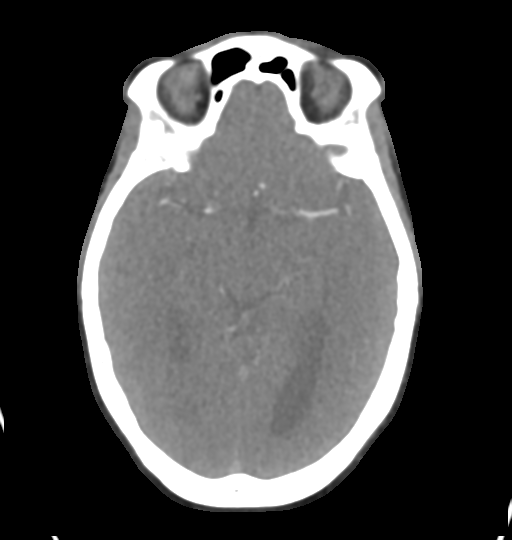
[im 267/312  bone]
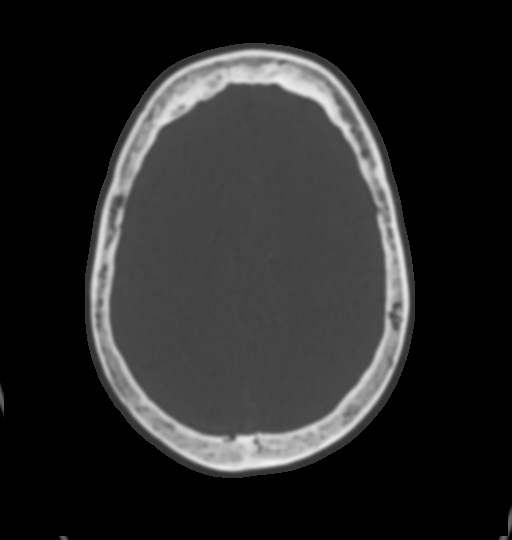

[Series 10: sag thins · sagittal · 0.41mm/px · 2 of 168 slices shown]
[im 24/168  soft-tissue]
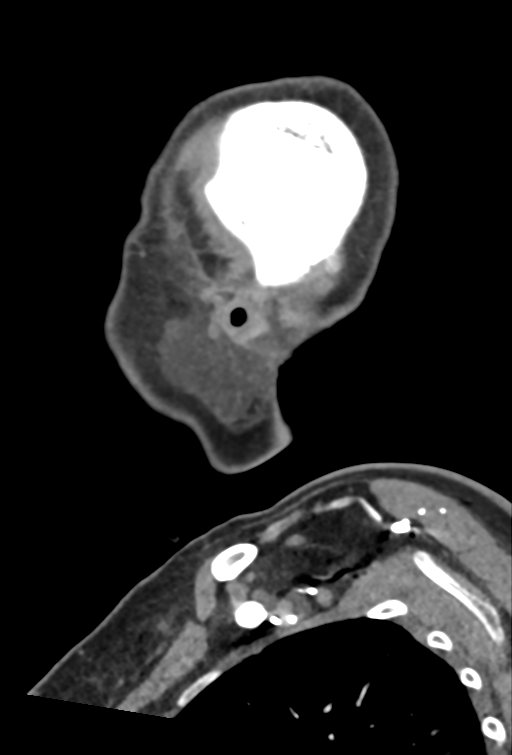
[im 145/168  soft-tissue]
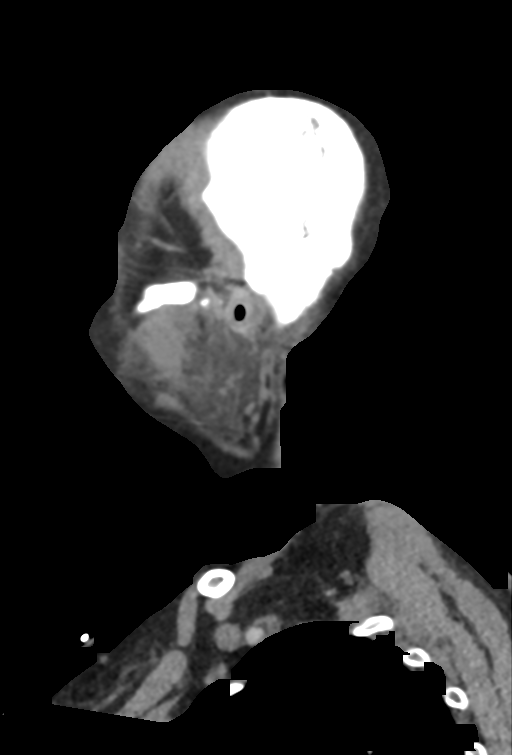

[10 of 36 positions shown; findings below may reference images not displayed]

RADIATION DOSE REDUCTION: This exam was performed according to the
departmental dose-optimization program which includes automated
exposure control, adjustment of the mA and/or kV according to
patient size and/or use of iterative reconstruction technique.

CONTRAST:  60mL OMNIPAQUE IOHEXOL 350 MG/ML SOLN
FINDINGS: CT HEAD FINDINGS

For noncontrast findings, please see same day CT head.

CTA NECK FINDINGS

Aortic arch: Standard branching. Imaged portion shows no evidence of
aneurysm or dissection. No significant stenosis of the major arch
vessel origins. Aortic atherosclerosis.

Right carotid system: No evidence of dissection, occlusion, or
hemodynamically significant stenosis (greater than 50%).
Atherosclerotic disease at the bifurcation and in the proximal ICA
is not hemodynamically significant.

Left carotid system: No evidence of dissection, occlusion, or
hemodynamically significant stenosis (greater than 50%).
Atherosclerotic disease at the bifurcation and in the proximal ICA
is not hemodynamically significant.

Vertebral arteries: The left vertebral artery is patent at its
origin and is visualized in the proximal V1 segment but poorly
visualized in the distal V1 segment and not definitively visualized
in V2 and V3. The right vertebral artery is patent from its origin
to the skull base.

Skeleton: No acute osseous abnormality. Poor dentition with multiple
dental caries.

Other neck: Hypoenhancing foci in the thyroid, the largest of which
is in the isthmus and left thyroid lobe, measuring up to 2.2 cm
(series 12, image 93).

Upper chest: No focal pulmonary opacity or pleural effusion.

Review of the MIP images confirms the above findings

CTA HEAD FINDINGS

Evaluation is somewhat limited by bolus timing.

Anterior circulation: Poor visualization of the proximal petrous
segment, which appears narrowed bilaterally but is normal in caliber
distally. Both internal carotid arteries are patent to the termini,
with mild narrowing in the distal right cavernous and proximal
right-greater-than-left supraclinoid segment.

Patent left A1. The right A1 is not definitively visualized. Normal
anterior communicating artery. Anterior cerebral arteries are patent
to their distal aspects.

No M1 stenosis or occlusion. Normal MCA bifurcations. Distal MCA
branches perfused and symmetric.

Posterior circulation: Non opacification of the left V4, with
minimal distal opacification that is likely retrograde versus a left
AICA. The right vertebral artery is patent to the vertebrobasilar
junction with mild narrowing distally (series 8, image 129). The
posteroinferior cerebellar arteries are not definitively seen.

Basilar patent to its distal aspect. Superior cerebellar arteries
patent proximally.

Patent P1 segments. PCAs are mildly irregular but perfused to their
distal aspects without stenosis. Possible left posterior
communicating artery.

Venous sinuses: As permitted by contrast timing, patent.

Anatomic variants: None significant.

Review of the MIP images confirms the above findings
IMPRESSION: 1. Non opacification of the left vertebral artery from the distal V1
segment through the distal V4 segment, with some retrograde
opacification at the vertebrobasilar junction, of indeterminate
acuity.
2. No other hemodynamically significant stenosis in the neck.
3. Evaluation of the intracranial vasculature is somewhat limited by
bolus timing. Within this limitation, no additional large vessel
occlusion. Mild narrowing in the right cavernous and bilateral
supraclinoid ICAs and distal right V4. The bilateral PICAs are not
visualized, which could be secondary to bolus timing.
4. Hypoenhancing lesions in the thyroid, the largest of which
measures up to 2.2 cm. This was most recently evaluated with
ultrasound in [E5], at which time a biopsy was recommended but does
not appear to have been performed. A non-emergent ultrasound of the
thyroid is recommended. (Reference: [HOSPITAL]. [E5]

## 2022-04-07 MED ORDER — CARVEDILOL 3.125 MG PO TABS
6.2500 mg | ORAL_TABLET | Freq: Two times a day (BID) | ORAL | Status: DC
Start: 2022-04-08 — End: 2022-04-08

## 2022-04-07 MED ORDER — IOHEXOL 350 MG/ML SOLN
60.0000 mL | Freq: Once | INTRAVENOUS | Status: AC | PRN
  Administered 2022-04-07: 60 mL via INTRAVENOUS

## 2022-04-07 MED ORDER — UMECLIDINIUM BROMIDE 62.5 MCG/ACT IN AEPB
1.0000 | INHALATION_SPRAY | Freq: Every day | RESPIRATORY_TRACT | Status: DC
  Filled 2022-04-07 (×2): qty 7

## 2022-04-07 MED ORDER — ATORVASTATIN CALCIUM 40 MG PO TABS
40.0000 mg | ORAL_TABLET | Freq: Every day | ORAL | Status: DC
  Administered 2022-04-08 – 2022-04-09 (×2): 40 mg via ORAL
  Filled 2022-04-07 (×2): qty 1

## 2022-04-07 MED ORDER — FLUTICASONE FUROATE-VILANTEROL 200-25 MCG/ACT IN AEPB
1.0000 | INHALATION_SPRAY | Freq: Every day | RESPIRATORY_TRACT | Status: DC
  Filled 2022-04-07 (×2): qty 28

## 2022-04-07 MED ORDER — LORAZEPAM 2 MG/ML IJ SOLN
1.0000 mg | Freq: Once | INTRAMUSCULAR | Status: AC
Start: 2022-04-07 — End: 2022-04-07
  Administered 2022-04-07: 1 mg via INTRAVENOUS
  Filled 2022-04-07: qty 1

## 2022-04-07 MED ORDER — ENOXAPARIN SODIUM 40 MG/0.4ML IJ SOSY
40.0000 mg | PREFILLED_SYRINGE | Freq: Every day | INTRAMUSCULAR | Status: DC
  Administered 2022-04-08: 40 mg via SUBCUTANEOUS
  Filled 2022-04-07: qty 0.4

## 2022-04-07 MED ORDER — FAMOTIDINE 20 MG PO TABS
20.0000 mg | ORAL_TABLET | Freq: Two times a day (BID) | ORAL | Status: DC
  Administered 2022-04-08 – 2022-04-09 (×3): 20 mg via ORAL
  Filled 2022-04-07 (×3): qty 1

## 2022-04-07 MED ORDER — STROKE: EARLY STAGES OF RECOVERY BOOK
Freq: Once | Status: AC
  Filled 2022-04-07: qty 1

## 2022-04-07 MED ORDER — MAGNESIUM SULFATE 2 GM/50ML IV SOLN
2.0000 g | Freq: Once | INTRAVENOUS | Status: AC
  Administered 2022-04-07: 2 g via INTRAVENOUS
  Filled 2022-04-07: qty 50

## 2022-04-07 MED ORDER — MECLIZINE HCL 25 MG PO TABS
50.0000 mg | ORAL_TABLET | Freq: Once | ORAL | Status: AC
  Administered 2022-04-07: 50 mg via ORAL
  Filled 2022-04-07: qty 2

## 2022-04-07 NOTE — Consult Note (Incomplete)
NEURO HOSPITALIST CONSULT NOTE   Requestig physician: Dr. Rhunette Croft  Reason for Consult: Unsteady gait  History obtained from:   Patient and Chart     HPI:                                                                                                                                          Grace Ferrell is an 56 y.o. female smoker with a PMHx of DM, hypercholesterolemia and HTN who presents to the ED for evaluation of a one month history of vomiting and diarrhea as well as a frontal headache that started the night before. She also endorsed feeling lightheaded, leg weakness and inability to stand. When nurse tech attempted to ambulated the patient in the hallway, the patient stumbled while standing up to walk. 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   On bedside interview, the patient denies having had any headache, vision change, trouble speaking or understanding, numbness or arm weakness. She states that her lightheadedness also includes a sensation of dizzy unsteadiness.   Home medications include atorvastatin.   Past Medical History:  Diagnosis Date   Diabetes mellitus    Hypercholesteremia    Hypertension     Past Surgical History:  Procedure Laterality Date   ENDOMETRIAL ABLATION     KNEE SURGERY     TUBAL LIGATION      Family History  Problem Relation Age of Onset   Stroke Mother    Hypertension Sister    Diabetes Sister    Breast cancer Sister    Hypertension Brother    Breast cancer Maternal Grandmother    Hypertension Brother    Hypertension Sister    Stroke Sister    Hypertension Sister              Social History:  reports that she has been smoking cigarettes. She has a 20.00 pack-year smoking history. She has never used smokeless tobacco. She reports that she does not drink alcohol and does not use drugs.  Allergies  Allergen Reactions   Sulfamethoxazole-Trimethoprim Shortness Of Breath and  Swelling    Airway involvement noted   Tessalon Perles [Benzonatate] Rash   Codeine Itching    With excoriations   Darvocet [Propoxyphene N-Acetaminophen] Nausea And Vomiting   Enalapril Cough    Dry cough   Morphine And Related Itching    With excoriations   Vicodin [Hydrocodone-Acetaminophen] Nausea Only and Cough    MEDICATIONS:  No current facility-administered medications on file prior to encounter.   Current Outpatient Medications on File Prior to Encounter  Medication Sig Dispense Refill   atorvastatin (LIPITOR) 40 MG tablet Take 1 tablet (40 mg total) by mouth daily. 30 tablet 10   carvedilol (COREG) 6.25 MG tablet Take 1 tablet (6.25 mg total) by mouth 2 (two) times daily with a meal. 180 tablet 2   cetirizine (ZYRTEC) 10 MG tablet Take 1 tablet (10 mg total) by mouth daily. 30 tablet 11   famotidine (PEPCID) 20 MG tablet Take 1 tablet (20 mg total) by mouth 2 (two) times daily. 60 tablet 0   fluticasone (FLONASE) 50 MCG/ACT nasal spray Place 2 sprays into both nostrils daily. (Patient taking differently: Place 1 spray into both nostrils in the morning and at bedtime.) 16 g 6   Fluticasone-Umeclidin-Vilant (TRELEGY ELLIPTA) 200-62.5-25 MCG/ACT AEPB Inhale 1 puff into the lungs daily. 2 each 0   furosemide (LASIX) 40 MG tablet Take 1 tablet (40 mg total) by mouth daily. 90 tablet 4   glimepiride (AMARYL) 4 MG tablet TAKE TWO TABLETS BY MOUTH EVERY MORNING WITH BREAKFAST 180 tablet 0   metFORMIN (GLUCOPHAGE) 1000 MG tablet TAKE ONE TABLET BY MOUTH TWICE A DAY WITH MEALS 180 tablet 0   Multiple Vitamins-Minerals (WOMENS MULTIVITAMIN) TABS Take 1 tablet by mouth daily.     Potassium Chloride ER 20 MEQ TBCR Take 10 mEq by mouth 2 (two) times daily for 3 days. (Patient not taking: Reported on 01/15/2022) 6 tablet 0   valsartan (DIOVAN) 40 MG tablet TAKE ONE TABLET BY MOUTH  DAILY 90 tablet 0    ROS:                                                                                                                                       As per HPI.   Blood pressure (!) 151/70, pulse 81, temperature 98.2 F (36.8 C), resp. rate 20, SpO2 97 %.   General Examination:                                                                                                        Physical Exam  HEENT-  [place]/AT   Lungs- Respirations unlabored Extremities- No edema   Neurological Examination Mental Status: Awake and alert. Oriented x 5. Speech fluent without evidence of aphasia. Naming and comprehension intact. Able to follow all commands without difficulty. Cranial Nerves: II: Temporal visual fields intact with no extinction to  DSS. PERRL.   III,IV, VI: EOMI. No nystagmus. No ptosis V: Temp sensation equal bilaterally  VII: Smile symmetric VIII: Hearing intact to voice IX,X: No hypophonia or hoarseness XI: Symmetric shoulder shrug XII: Midline tongue extension Motor: RUE 4+/5 proximally and distally LUE 5/5 proximally and distally BLE 4+/5 in the context of poor effort. Subjectively feels weaker on the left during testing of lower extremities.  No pronator drift Sensory: Decreased temp sensation to left shoulder region, otherwise BUE temp sensation is equal.  Subjectively does not feel the coolness of a stimulus to legs bilaterally.  FT intact to BUE and BLE.  Deep Tendon Reflexes: 2+ right biceps and brachioradialis, 1+ left biceps and brachioradialis. 2+ right patellar, 1+ left patellar. Toes downgoing bilaterally.  Cerebellar: No ataxia with FNF bilaterally. Poor cooperation with testing of BLE.  Gait: Deferred   Lab Results: Basic Metabolic Panel: Recent Labs  Lab 04/07/22 0847 04/07/22 1119  NA 142  --   K 3.4*  --   CL 103  --   CO2 26  --   GLUCOSE 224*  --   BUN 7  --   CREATININE 0.89  --   CALCIUM 9.7  --   MG  --  1.4*    CBC: Recent Labs   Lab 04/07/22 0847  WBC 7.4  HGB 11.1*  HCT 33.8*  MCV 77.0*  PLT 225    Cardiac Enzymes: No results for input(s): "CKTOTAL", "CKMB", "CKMBINDEX", "TROPONINI" in the last 168 hours.  Lipid Panel: No results for input(s): "CHOL", "TRIG", "HDL", "CHOLHDL", "VLDL", "LDLCALC" in the last 168 hours.  Imaging: MR BRAIN WO CONTRAST  Result Date: 04/07/2022 CLINICAL DATA:  Dizziness, headache EXAM: MRI HEAD WITHOUT CONTRAST MRA HEAD WITHOUT CONTRAST TECHNIQUE: Multiplanar, multi-echo pulse sequences of the brain and surrounding structures were acquired without intravenous contrast. Angiographic images of the Circle of Willis were acquired using MRA technique without intravenous contrast. COMPARISON:  No prior MRI, correlation is made with CT head 04/07/2022 and 10/27/2009 FINDINGS: Evaluation is limited by motion artifact. In addition, the patient terminated the MRI early, after acquisition of only the diffusion-weighted and FLAIR sequences, which are motion degraded. MRI HEAD FINDINGS Restricted diffusion with ADC correlate in the medial right cerebellum (series 13, images 52-58 and series 11, image 51) and posterior left cerebellum (series 13, images 50-56 and series 11, images 46-48). MRA HEAD FINDINGS Anterior circulation: Both internal carotid arteries are patent to the cavernous portions, without significant stenosis; the supraclinoid portions are significantly motion degraded. A1 segments, anterior communicating artery, and proximal ACAs are motion degraded. The distal ACAs appear patent. M1 segments are motion degraded but grossly patent. Evaluation of more distal MCAs is limited. Posterior circulation: The right vertebral artery is patent to the vertebrobasilar junction. The left vertebral artery is not seen. Posterior inferior cerebral arteries not visualized. Basilar is grossly patent to its distal aspect. Superior cerebellar arteries visualized. Proximal PCAs are grossly patent. Anatomic  variants: None appreciated. IMPRESSION: Evaluation is significantly limited by motion artifact and early termination of the exam at the patient's request. Within this limitation, there are acute infarcts in the inferomedial right cerebellum and posteroinferior left cerebellum. The left vertebral artery is not visualized, which is not favored to be artifactual but is of indeterminate acuity. Evaluation for the PICAs is limited by motion. Consider CTA head and neck for further evaluation. These results were called by telephone at the time of interpretation on 04/07/2022 at 7:36 pm to provider ANKIT NANAVATI ,  who verbally acknowledged these results. Electronically Signed   By: Wiliam Ke M.D.   On: 04/07/2022 19:36   MR ANGIO HEAD WO CONTRAST  Result Date: 04/07/2022 CLINICAL DATA:  Dizziness, headache EXAM: MRI HEAD WITHOUT CONTRAST MRA HEAD WITHOUT CONTRAST TECHNIQUE: Multiplanar, multi-echo pulse sequences of the brain and surrounding structures were acquired without intravenous contrast. Angiographic images of the Circle of Willis were acquired using MRA technique without intravenous contrast. COMPARISON:  No prior MRI, correlation is made with CT head 04/07/2022 and 10/27/2009 FINDINGS: Evaluation is limited by motion artifact. In addition, the patient terminated the MRI early, after acquisition of only the diffusion-weighted and FLAIR sequences, which are motion degraded. MRI HEAD FINDINGS Restricted diffusion with ADC correlate in the medial right cerebellum (series 13, images 52-58 and series 11, image 51) and posterior left cerebellum (series 13, images 50-56 and series 11, images 46-48). MRA HEAD FINDINGS Anterior circulation: Both internal carotid arteries are patent to the cavernous portions, without significant stenosis; the supraclinoid portions are significantly motion degraded. A1 segments, anterior communicating artery, and proximal ACAs are motion degraded. The distal ACAs appear patent. M1  segments are motion degraded but grossly patent. Evaluation of more distal MCAs is limited. Posterior circulation: The right vertebral artery is patent to the vertebrobasilar junction. The left vertebral artery is not seen. Posterior inferior cerebral arteries not visualized. Basilar is grossly patent to its distal aspect. Superior cerebellar arteries visualized. Proximal PCAs are grossly patent. Anatomic variants: None appreciated. IMPRESSION: Evaluation is significantly limited by motion artifact and early termination of the exam at the patient's request. Within this limitation, there are acute infarcts in the inferomedial right cerebellum and posteroinferior left cerebellum. The left vertebral artery is not visualized, which is not favored to be artifactual but is of indeterminate acuity. Evaluation for the PICAs is limited by motion. Consider CTA head and neck for further evaluation. These results were called by telephone at the time of interpretation on 04/07/2022 at 7:36 pm to provider [facility] , who verbally acknowledged these results. Electronically Signed   By: Wiliam Ke M.D.   On: 04/07/2022 19:36   CT HEAD WO CONTRAST  Result Date: 04/07/2022 CLINICAL DATA:  Dizziness EXAM: CT HEAD WITHOUT CONTRAST TECHNIQUE: Contiguous axial images were obtained from the base of the skull through the vertex without intravenous contrast. RADIATION DOSE REDUCTION: This exam was performed according to the departmental dose-optimization program which includes automated exposure control, adjustment of the mA and/or kV according to patient size and/or use of iterative reconstruction technique. COMPARISON:  CT head 10/27/2009 FINDINGS: Brain: No acute intracranial hemorrhage, mass effect, or herniation. No extra-axial fluid collections. No evidence of acute territorial infarct. No hydrocephalus. Vascular: No hyperdense vessel or unexpected calcification. Skull: Normal. Negative for fracture or focal lesion.  Sinuses/Orbits: No acute finding. Other: None. IMPRESSION: No acute intracranial process identified. Electronically Signed   By: Jannifer Hick M.D.   On: 04/07/2022 12:56     Assessment: 56 year old female presenting with gait instability. MRI reveals small acute cerebellar ischemic infarctions 1. Exam reveals patchy findings that are not referable to a single neuroanatomical localization.  2. MRI brain: Evaluation is significantly limited by motion artifact and early termination of the exam at the patient's request. Within this limitation, there are acute infarcts in the inferomedial right cerebellum and posteroinferior left cerebellum.  3. MRA head: Limited by motion artifact. The left vertebral artery is not visualized, which is not favored to be artifactual but is of  indeterminate acuity. Evaluation for the PICAs is limited by motion.  4. CTA of head will be needed to image the intracranial vasculature at higher resolution. Also will need to obtain CTA of neck.  5. Stroke risk factors: Smoking, DM, hypercholesterolemia and HTN   Recommendations: 1. HgbA1c, fasting lipid panel 2. CTA of head and neck 3. PT consult, OT consult, Speech consult 4. TTE 5. Continue atorvastatin at 40 mg po qd 6. Start DAPT with ASA and Plavix. Continue Plavix for 21 days, then stop. Continue ASA indefinitely.   7. Risk factor modification 8. Telemetry monitoring 9. Frequent neuro checks 10. NPO until passes stroke swallow screen 11. Permissive HTN x 24 hours 12. Glycemic control    Electronically signed: Dr. Caryl Pina 04/07/2022, 8:17 PM

## 2022-04-07 NOTE — H&P (Incomplete)
Hospital Admission History and Physical Service Pager: [phone]  Patient name: Grace Ferrell Medical record number: [id number] Date of Birth: 1966-05-22 Age: 56 y.o. Gender: female  Primary Care Provider: Maury Dus, MD Consultants: Neurology  Code Status: DNR per patient   Preferred Emergency Contact:  Extended Emergency Contact Information Primary Emergency Contact: Ysaguirre,Shandricka Address: [address]           [place], [place] [address]          [place], [place] [address] [place] of [place] Phone: [phone] Relation: Daughter  Chief Complaint: weakness in legs   Assessment and Plan: MADESYN Ferrell is a 56 y.o. female presenting with *** .    Differential for this patient's presentation of this includes ***.  (***describe here why less likely than primary diagnosis-delete this once complete***)  No notes have been filed under this hospital service. Service: Family Medicine      FEN/GI: *** VTE Prophylaxis: ***  Disposition: ***  History of Present Illness:  Grace Ferrell is a 55 y.o. female presenting with ***    In the ED, ***  Review Of Systems: Per HPI with the following additions: ***  Pertinent Past Medical History: *** Remainder reviewed in history tab.   Pertinent Past Surgical History: ***  Remainder reviewed in history tab.   Pertinent Social History: Tobacco use: Yes/No/Former Alcohol use: *** Other Substance use: *** Lives with ***  Pertinent Family History:  Mother & Sister  - Stroke  HTN - sister, brother  DM - sister  Sister and Maternal GM - breast CA  Remainder reviewed in history tab.   Important Outpatient Medications: *** Remainder reviewed in medication history.   Objective: BP (!) 151/70 (BP Location: Right Arm)   Pulse 81   Temp 98.2 F (36.8 C)   Resp 20   SpO2 97%   Exam: General: *** Eyes: *** ENTM: *** Neck: *** Cardiovascular: *** Respiratory: *** Gastrointestinal: *** MSK: *** Derm:  *** Neuro: *** Psych: ***  Labs:  CBC BMET  Recent Labs  Lab 04/07/22 0847  WBC 7.4  HGB 11.1*  HCT 33.8*  PLT 225   Recent Labs  Lab 04/07/22 0847  NA 142  K 3.4*  CL 103  CO2 26  BUN 7  CREATININE 0.89  GLUCOSE 224*  CALCIUM 9.7    Pertinent additional labs ***.  EKG: My own interpretation (not copied from electronic read) ***    Imaging Studies Performed:  Imaging Study (ie. 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 Angiographic images of the Circle of Willis were acquired using MRA technique without intravenous contrast. COMPARISON:  No prior MRI, correlation is Grace with CT head 04/07/2022 and 10/27/2009 FINDINGS: Evaluation is limited by motion artifact. In addition, the patient terminated the MRI early, after acquisition of only the diffusion-weighted and FLAIR sequences, which are motion degraded. MRI HEAD FINDINGS Restricted diffusion with ADC correlate in the medial right cerebellum (series 13, images 52-58 and series 11, image 51) and posterior left cerebellum (series 13, images 50-56 and series 11, images 46-48). MRA HEAD FINDINGS Anterior circulation: Both internal carotid arteries are patent to the  cavernous portions, without significant stenosis; the supraclinoid portions are significantly motion degraded. A1 segments, anterior communicating artery, and proximal ACAs are motion degraded. The distal ACAs appear patent. M1 segments are motion degraded but grossly patent. Evaluation of more distal MCAs is limited. Posterior circulation: The right vertebral artery is patent to the vertebrobasilar junction. The left  vertebral artery is not seen. Posterior inferior cerebral arteries not visualized. Basilar is grossly patent to its distal aspect. Superior cerebellar arteries visualized. Proximal PCAs are grossly patent. Anatomic variants: None appreciated. IMPRESSION: Evaluation is significantly limited by motion artifact and early termination of the exam at the patient's request. Within this limitation, there are acute infarcts in the inferomedial right cerebellum and posteroinferior left cerebellum. The left vertebral artery is not visualized, which is not favored to be artifactual but is of indeterminate acuity. Evaluation for the PICAs is limited by motion. Consider CTA head and neck for further evaluation. These results were called by telephone at the time of interpretation on 04/07/2022 at 7:36 pm to provider [facility] , who verbally acknowledged these results. Electronically Signed   By: Merilyn Baba M.D.   On: 04/07/2022 19:36   MR ANGIO HEAD WO CONTRAST  Result Date: 04/07/2022 CLINICAL DATA:  Dizziness, headache EXAM: MRI HEAD WITHOUT CONTRAST MRA HEAD WITHOUT CONTRAST TECHNIQUE: Multiplanar, multi-echo pulse sequences of the brain and surrounding structures were acquired without intravenous contrast. Angiographic images of the Circle of Willis were acquired using MRA technique without intravenous contrast. COMPARISON:  No prior MRI, correlation is Grace with CT head 04/07/2022 and 10/27/2009 FINDINGS: Evaluation is limited by motion artifact. In addition, the patient terminated the MRI early, after acquisition of only the diffusion-weighted and FLAIR sequences, which are motion degraded. MRI HEAD FINDINGS Restricted diffusion with ADC correlate in the medial right cerebellum (series 13, images 52-58 and series 11, image 51) and posterior left cerebellum (series 13, images 50-56 and series 11, images 46-48). MRA HEAD FINDINGS Anterior circulation: Both internal carotid arteries are patent to the cavernous  portions, without significant stenosis; the supraclinoid portions are significantly motion degraded. A1 segments, anterior communicating artery, and proximal ACAs are motion degraded. The distal ACAs appear patent. M1 segments are motion degraded but grossly patent. Evaluation of more distal MCAs is limited. Posterior circulation: The right vertebral artery is patent to the vertebrobasilar junction. The left vertebral artery is not seen. Posterior inferior cerebral arteries not visualized. Basilar is grossly patent to its distal aspect. Superior cerebellar arteries visualized. Proximal PCAs are grossly patent. Anatomic variants: None appreciated. IMPRESSION: Evaluation is significantly limited by motion artifact and early termination of the exam at the patient's request. Within this limitation, there are acute infarcts in the inferomedial right cerebellum and posteroinferior left cerebellum. The left vertebral artery is not visualized, which is not favored to be artifactual but is of indeterminate acuity. Evaluation for the PICAs is limited by motion. Consider CTA head and neck for further evaluation. These results were called by telephone at the time of interpretation on 04/07/2022 at 7:36 pm to provider [facility] , who verbally acknowledged these results. Electronically Signed   By: Merilyn Baba M.D.   On: 04/07/2022 19:36   CT HEAD WO CONTRAST  Result Date: 04/07/2022 CLINICAL DATA:  Dizziness EXAM: CT HEAD WITHOUT CONTRAST TECHNIQUE: Contiguous axial images were obtained from the base of the skull through the vertex without intravenous contrast. RADIATION DOSE REDUCTION: This exam was performed according to the departmental dose-optimization program which includes automated exposure control, adjustment  of the mA and/or kV according to patient size and/or use of iterative reconstruction technique. COMPARISON:  CT head 10/27/2009 FINDINGS: Brain: No acute intracranial hemorrhage, mass effect, or  herniation. No extra-axial fluid collections. No evidence of acute territorial infarct. No hydrocephalus. Vascular: No hyperdense vessel or unexpected calcification. Skull: Normal. Negative for fracture or focal lesion. Sinuses/Orbits: No acute finding. Other: None. IMPRESSION: No acute intracranial process identified. Electronically Signed   By: Jannifer Hick M.D.   On: 04/07/2022 12:56     My Interpretation: Ronnald Ramp, MD 04/07/2022, 9:08 PM PGY-3, [facility]  FPTS Intern pager: [phone], text pages welcome Secure chat group [facility] Teaching Service

## 2022-04-07 NOTE — ED Notes (Signed)
Attempted to ambulate pt out in the hallway. Pt stumbled while standing to walk. Pt was able to catch themselves and was assisted back into bed. Pt still feeling lightheaded.

## 2022-04-07 NOTE — ED Provider Notes (Signed)
[facility] EMERGENCY DEPARTMENT Provider Note   CSN: [id number] Arrival date & time: 04/07/22  1025     History  No chief complaint on file.   Grace Ferrell is a 56 y.o. female.  HPI     56 year old female comes in with chief complaint of dizziness She has past medical history of hypertension, hyperlipidemia, diabetes and heavy tobacco use disorder.  Patient indicates that she started having dizziness and headache last night, after dinner.  She states that she has been having diarrhea and vomiting for the last month.  Yesterday, while she was on the commode she suddenly started feeling dizzy.  Dizziness is described as spinning sensation.  Her symptoms are present whenever she tries to walk.  Her symptoms resolved when she lays down.  Patient feels unsteady when she walks.  Her symptoms have progressed over time and she finally decided to come to the ER today.  Patient denies any associated dysarthria, dysphagia, diplopia.  She also denies any associated one-sided weakness, numbness.   Her symptoms are worse with her trying to walk.  She is unsure if movement of her head makes her symptoms worse.  Headaches are frontal, throbbing and constant.  Home Medications Prior to Admission medications   Medication Sig Start Date End Date Taking? Authorizing Provider  atorvastatin (LIPITOR) 40 MG tablet Take 1 tablet (40 mg total) by mouth daily. 11/05/21  Yes Maury Dus, MD  carvedilol (COREG) 6.25 MG tablet Take 1 tablet (6.25 mg total) by mouth 2 (two) times daily with a meal. 11/05/21  Yes Maury Dus, MD  cetirizine (ZYRTEC) 10 MG tablet Take 1 tablet (10 mg total) by mouth daily. 06/18/21  Yes Maury Dus, MD  famotidine (PEPCID) 20 MG tablet Take 1 tablet (20 mg total) by mouth 2 (two) times daily. 02/12/22  Yes Erick Alley, DO  furosemide (LASIX) 40 MG tablet Take 1 tablet (40 mg total) by mouth daily. 11/05/21  Yes Maury Dus, MD  glimepiride  (AMARYL) 4 MG tablet TAKE TWO TABLETS BY MOUTH EVERY MORNING WITH BREAKFAST Patient taking differently: Take 8 mg by mouth daily with breakfast. 02/05/22  Yes Maury Dus, MD  metFORMIN (GLUCOPHAGE) 1000 MG tablet TAKE ONE TABLET BY MOUTH TWICE A DAY WITH MEALS Patient taking differently: Take 1,000 mg by mouth 2 (two) times daily with a meal. 02/05/22  Yes Maury Dus, MD  Multiple Vitamins-Minerals (WOMENS MULTIVITAMIN) TABS Take 1 tablet by mouth daily.   Yes [provider]  valsartan (DIOVAN) 40 MG tablet TAKE ONE TABLET BY MOUTH DAILY Patient taking differently: Take 40 mg by mouth daily. 02/05/22  Yes Maury Dus, MD  fluticasone (FLONASE) 50 MCG/ACT nasal spray Place 2 sprays into both nostrils daily. Patient taking differently: Place 1 spray into both nostrils in the morning and at bedtime. 08/08/21   Maury Dus, MD  Fluticasone-Umeclidin-Vilant (TRELEGY ELLIPTA) 200-62.5-25 MCG/ACT AEPB Inhale 1 puff into the lungs daily. 01/15/22   Moses Manners, MD  Potassium Chloride ER 20 MEQ TBCR Take 10 mEq by mouth 2 (two) times daily for 3 days. Patient not taking: Reported on 01/15/2022 12/14/20 12/17/20  Meccariello, Solmon Ice, DO      Allergies    Sulfamethoxazole-trimethoprim, Tessalon perles [benzonatate], Codeine, Darvocet [propoxyphene n-acetaminophen], Enalapril, Morphine and related, and Vicodin [hydrocodone-acetaminophen]    Review of Systems   Review of Systems  All other systems reviewed and are negative.   Physical Exam Updated Vital Signs BP (!) 151/70 (BP Location: Right Arm)  Pulse 81   Temp 98.2 F (36.8 C)   Resp 20   SpO2 97%  Physical Exam Vitals and nursing note reviewed.  Constitutional:      Appearance: She is well-developed.  HENT:     Head: Atraumatic.  Eyes:     Extraocular Movements: Extraocular movements intact.     Pupils: Pupils are equal, round, and reactive to light.     Comments: Head impulse test is normal No clear  nystagmus appreciated.  Certainly there is no vertical or rotary nystagmus No skew noted  Hints exam is equivocal  Peripheral visual fields are intact  Cardiovascular:     Rate and Rhythm: Normal rate.  Pulmonary:     Effort: Pulmonary effort is normal.  Musculoskeletal:     Cervical back: Normal range of motion and neck supple.  Skin:    General: Skin is warm and dry.  Neurological:     Mental Status: She is alert and oriented to person, place, and time.     Cranial Nerves: No cranial nerve deficit.     Sensory: No sensory deficit.     Motor: No weakness.     Coordination: Coordination normal.     Gait: Gait abnormal.     Comments: No dysmetria.  Patient noted to be leaning towards left side during our exam, and with ambulation she was favoring left side.  Patient has ataxic gait     ED Results / Procedures / Treatments   Labs (all labs ordered are listed, but only abnormal results are displayed) Labs Reviewed  COMPREHENSIVE METABOLIC PANEL - Abnormal; Notable for the following components:      Result Value   Potassium 3.4 (*)    Glucose, Bld 224 (*)    All other components within normal limits  CBC - Abnormal; Notable for the following components:   Hemoglobin 11.1 (*)    HCT 33.8 (*)    MCV 77.0 (*)    MCH 25.3 (*)    RDW 16.6 (*)    All other components within normal limits  MAGNESIUM - Abnormal; Notable for the following components:   Magnesium 1.4 (*)    All other components within normal limits  CBG MONITORING, ED - Abnormal; Notable for the following components:   Glucose-Capillary 225 (*)    All other components within normal limits  LIPASE, BLOOD  URINALYSIS, ROUTINE W REFLEX MICROSCOPIC  I-STAT BETA HCG BLOOD, ED ([facility], [facility], [facility] ONLY)    EKG None  Radiology MR BRAIN WO CONTRAST  Result Date: 04/07/2022 CLINICAL DATA:  Dizziness, headache EXAM: MRI HEAD WITHOUT CONTRAST MRA HEAD WITHOUT CONTRAST TECHNIQUE: Multiplanar, multi-echo pulse sequences of the  brain and surrounding structures were acquired without intravenous contrast. Angiographic images of the Circle of Willis were acquired using MRA technique without intravenous contrast. COMPARISON:  No prior MRI, correlation is made with CT head 04/07/2022 and 10/27/2009 FINDINGS: Evaluation is limited by motion artifact. In addition, the patient terminated the MRI early, after acquisition of only the diffusion-weighted and FLAIR sequences, which are motion degraded. MRI HEAD FINDINGS Restricted diffusion with ADC correlate in the medial right cerebellum (series 13, images 52-58 and series 11, image 51) and posterior left cerebellum (series 13, images 50-56 and series 11, images 46-48). MRA HEAD FINDINGS Anterior circulation: Both internal carotid arteries are patent to the cavernous portions, without significant stenosis; the supraclinoid portions are significantly motion degraded. A1 segments, anterior communicating artery, and proximal ACAs are motion degraded. The distal ACAs appear  patent. M1 segments are motion degraded but grossly patent. Evaluation of more distal MCAs is limited. Posterior circulation: The right vertebral artery is patent to the vertebrobasilar junction. The left vertebral artery is not seen. Posterior inferior cerebral arteries not visualized. Basilar is grossly patent to its distal aspect. Superior cerebellar arteries visualized. Proximal PCAs are grossly patent. Anatomic variants: None appreciated. IMPRESSION: Evaluation is significantly limited by motion artifact and early termination of the exam at the patient's request. Within this limitation, there are acute infarcts in the inferomedial right cerebellum and posteroinferior left cerebellum. The left vertebral artery is not visualized, which is not favored to be artifactual but is of indeterminate acuity. Evaluation for the PICAs is limited by motion. Consider CTA head and neck for further evaluation. These results were called by  telephone at the time of interpretation on 04/07/2022 at 7:36 pm to provider [facility] , who verbally acknowledged these results. Electronically Signed   By: Wiliam Ke M.D.   On: 04/07/2022 19:36   MR ANGIO HEAD WO CONTRAST  Result Date: 04/07/2022 CLINICAL DATA:  Dizziness, headache EXAM: MRI HEAD WITHOUT CONTRAST MRA HEAD WITHOUT CONTRAST TECHNIQUE: Multiplanar, multi-echo pulse sequences of the brain and surrounding structures were acquired without intravenous contrast. Angiographic images of the Circle of Willis were acquired using MRA technique without intravenous contrast. COMPARISON:  No prior MRI, correlation is made with CT head 04/07/2022 and 10/27/2009 FINDINGS: Evaluation is limited by motion artifact. In addition, the patient terminated the MRI early, after acquisition of only the diffusion-weighted and FLAIR sequences, which are motion degraded. MRI HEAD FINDINGS Restricted diffusion with ADC correlate in the medial right cerebellum (series 13, images 52-58 and series 11, image 51) and posterior left cerebellum (series 13, images 50-56 and series 11, images 46-48). MRA HEAD FINDINGS Anterior circulation: Both internal carotid arteries are patent to the cavernous portions, without significant stenosis; the supraclinoid portions are significantly motion degraded. A1 segments, anterior communicating artery, and proximal ACAs are motion degraded. The distal ACAs appear patent. M1 segments are motion degraded but grossly patent. Evaluation of more distal MCAs is limited. Posterior circulation: The right vertebral artery is patent to the vertebrobasilar junction. The left vertebral artery is not seen. Posterior inferior cerebral arteries not visualized. Basilar is grossly patent to its distal aspect. Superior cerebellar arteries visualized. Proximal PCAs are grossly patent. Anatomic variants: None appreciated. IMPRESSION: Evaluation is significantly limited by motion artifact and early  termination of the exam at the patient's request. Within this limitation, there are acute infarcts in the inferomedial right cerebellum and posteroinferior left cerebellum. The left vertebral artery is not visualized, which is not favored to be artifactual but is of indeterminate acuity. Evaluation for the PICAs is limited by motion. Consider CTA head and neck for further evaluation. These results were called by telephone at the time of interpretation on 04/07/2022 at 7:36 pm to provider [facility] , who verbally acknowledged these results. Electronically Signed   By: Wiliam Ke M.D.   On: 04/07/2022 19:36   CT HEAD WO CONTRAST  Result Date: 04/07/2022 CLINICAL DATA:  Dizziness EXAM: CT HEAD WITHOUT CONTRAST TECHNIQUE: Contiguous axial images were obtained from the base of the skull through the vertex without intravenous contrast. RADIATION DOSE REDUCTION: This exam was performed according to the departmental dose-optimization program which includes automated exposure control, adjustment of the mA and/or kV according to patient size and/or use of iterative reconstruction technique. COMPARISON:  CT head 10/27/2009 FINDINGS: Brain: No acute intracranial hemorrhage, mass  effect, or herniation. No extra-axial fluid collections. No evidence of acute territorial infarct. No hydrocephalus. Vascular: No hyperdense vessel or unexpected calcification. Skull: Normal. Negative for fracture or focal lesion. Sinuses/Orbits: No acute finding. Other: None. IMPRESSION: No acute intracranial process identified. Electronically Signed   By: Jannifer Hick M.D.   On: 04/07/2022 12:56    Procedures .Critical Care  Performed by: Derwood Kaplan, MD Authorized by: Derwood Kaplan, MD   Critical care provider statement:    Critical care time (minutes):  48   Critical care was necessary to treat or prevent imminent or life-threatening deterioration of the following conditions:  CNS failure or compromise   Critical care  was time spent personally by me on the following activities:  Development of treatment plan with patient or surrogate, discussions with consultants, evaluation of patient's response to treatment, examination of patient, ordering and review of laboratory studies, ordering and review of radiographic studies, ordering and performing treatments and interventions, pulse oximetry, re-evaluation of patient's condition and review of old charts     Medications Ordered in ED Medications  LORazepam (ATIVAN) injection 1 mg (1 mg Intravenous Given 04/07/22 1348)  meclizine (ANTIVERT) tablet 50 mg (50 mg Oral Given 04/07/22 1345)  magnesium sulfate IVPB 2 g 50 mL (0 g Intravenous Stopped 04/07/22 1505)    ED Course/ Medical Decision Making/ A&P                           Medical Decision Making Amount and/or Complexity of Data Reviewed Labs: ordered. Radiology: ordered.  Risk Prescription drug management. Decision regarding hospitalization.   This patient presents to the ED with chief complaint(s) of dizziness, headaches as acute complaint and nausea and diarrhea as chronic complaint with pertinent past medical history of hypertension, hyperlipidemia, diabetes which further complicates the presenting complaint. The complaint involves an extensive differential diagnosis and also carries with it a high risk of complications and morbidity.    I performed hints exam because of concerns for posterior circulation stroke.  Patient does not have any rotary or vertical nystagmus.  Her hints exam is equivocal, as it is not consistent with a central or peripheral process.  There are other inconsistent findings on history which makes central versus peripheral vertigo classification difficult.  Patient indicates that the symptoms were sudden, gradual, which are more consistent with central process.  However, she indicates that her symptoms resolved when she is laying flat, and is triggered when she gets up to walk  -which is more consistent with peripheral vertigo.  She also has nausea, which is more consistent with peripheral vertigo.  With ambulation, patient is clearly noted to be favoring left side and is quite unsteady.  Patient is outside of TNK window.  We will give patient Ativan and reassess.  If she still having vertiginous symptoms and there is no improvement, then we will proceed with MRI, MRA.  The differential diagnosis includes DDx includes: Central vertigo:  Tumor  Stroke  ICH  Vertebrobasilar TIA        Basilar insufficiency  Peripheral Vertigo:  BPPV  Vestibular neuritis  Meniere disease  Migrainous vertigo  Ear Infection    Additional history obtained: Records reviewed Primary Care Documents  Independent labs interpretation:  The following labs were independently interpreted: Slightly low magnesium.  Metabolic profile otherwise reassuring.  Independent visualization of imaging: - I independently visualized the following imaging with scope of interpretation limited to determining acute life threatening conditions related  to emergency care: CT scan of the brain, which revealed no evidence of brain tumor or brain bleed.  Treatment and Reassessment: With persistent symptoms, MRI, MRA were ordered.  Per radiologist, there is evidence of cerebellar stroke Neurology has been consulted, they will see the patient with medicine admitting.  Patient made aware of the diagnosis. CT angiogram ordered, as radiologist indicated that there was enough motion effect to deem MRA not very helpful.  Admitting team will follow-up with the CT angio results along with neurology.  Final Clinical Impression(s) / ED Diagnoses Final diagnoses:  Cerebellar stroke [facility])    Rx / DC Orders ED Discharge Orders     None         Derwood Kaplan, MD 04/07/22 2139

## 2022-04-07 NOTE — ED Triage Notes (Signed)
Patient complains of vomiting and diarrhea x 1 month and last night developed frontal headache. Patient states that her legs are weak and cannot stand. Denies abdominal pain, denies dysuria

## 2022-04-07 NOTE — ED Notes (Signed)
Patient transported to MRI 

## 2022-04-08 ENCOUNTER — Encounter (HOSPITAL_COMMUNITY): Payer: Self-pay | Admitting: Family Medicine

## 2022-04-08 ENCOUNTER — Telehealth: Payer: Self-pay | Admitting: Pharmacist

## 2022-04-08 ENCOUNTER — Observation Stay (HOSPITAL_BASED_OUTPATIENT_CLINIC_OR_DEPARTMENT_OTHER): Payer: No Typology Code available for payment source

## 2022-04-08 DIAGNOSIS — I5022 Chronic systolic (congestive) heart failure: Secondary | ICD-10-CM

## 2022-04-08 DIAGNOSIS — I63443 Cerebral infarction due to embolism of bilateral cerebellar arteries: Secondary | ICD-10-CM

## 2022-04-08 DIAGNOSIS — E119 Type 2 diabetes mellitus without complications: Secondary | ICD-10-CM

## 2022-04-08 DIAGNOSIS — I6389 Other cerebral infarction: Secondary | ICD-10-CM

## 2022-04-08 DIAGNOSIS — F17200 Nicotine dependence, unspecified, uncomplicated: Secondary | ICD-10-CM

## 2022-04-08 DIAGNOSIS — I513 Intracardiac thrombosis, not elsewhere classified: Secondary | ICD-10-CM

## 2022-04-08 LAB — CBG MONITORING, ED
Glucose-Capillary: 152 mg/dL — ABNORMAL HIGH (ref 70–99)
Glucose-Capillary: 134 mg/dL — ABNORMAL HIGH (ref 70–99)

## 2022-04-08 LAB — URINALYSIS, ROUTINE W REFLEX MICROSCOPIC
Bilirubin Urine: NEGATIVE
Glucose, UA: NEGATIVE mg/dL
Hgb urine dipstick: NEGATIVE
Ketones, ur: 5 mg/dL — AB
Leukocytes,Ua: NEGATIVE
Nitrite: NEGATIVE
Protein, ur: NEGATIVE mg/dL
Specific Gravity, Urine: 1.04 — ABNORMAL HIGH (ref 1.005–1.030)
pH: 5 (ref 5.0–8.0)

## 2022-04-08 LAB — LIPID PANEL
Cholesterol: 117 mg/dL (ref 0–200)
HDL: 28 mg/dL — ABNORMAL LOW (ref >40)
LDL Cholesterol: 69 mg/dL (ref 0–99)
Total CHOL/HDL Ratio: 4.2 RATIO
Triglycerides: 99 mg/dL (ref <150)
VLDL: 20 mg/dL (ref 0–40)

## 2022-04-08 LAB — CBC
HCT: 31.8 % — ABNORMAL LOW (ref 36.0–46.0)
Hemoglobin: 10.5 g/dL — ABNORMAL LOW (ref 12.0–15.0)
MCH: 25 pg — ABNORMAL LOW (ref 26.0–34.0)
MCHC: 33 g/dL (ref 30.0–36.0)
MCV: 75.7 fL — ABNORMAL LOW (ref 80.0–100.0)
Platelets: 223 10*3/uL (ref 150–400)
RBC: 4.2 MIL/uL (ref 3.87–5.11)
RDW: 16.3 % — ABNORMAL HIGH (ref 11.5–15.5)
WBC: 7.3 10*3/uL (ref 4.0–10.5)
nRBC: 0 % (ref 0.0–0.2)

## 2022-04-08 LAB — ECHOCARDIOGRAM COMPLETE
AV Vena cont: 0.2 cm
Area-P 1/2: 5.38 cm2
Calc EF: 37.5 %
Height: 62 in
P 1/2 time: 307 msec
S' Lateral: 4.5 cm
Single Plane A2C EF: 33.9 %
Single Plane A4C EF: 38.9 %
Weight: 2631.41 oz

## 2022-04-08 LAB — BASIC METABOLIC PANEL
Anion gap: 11 (ref 5–15)
BUN: 5 mg/dL — ABNORMAL LOW (ref 6–20)
CO2: 24 mmol/L (ref 22–32)
Calcium: 9.1 mg/dL (ref 8.9–10.3)
Chloride: 109 mmol/L (ref 98–111)
Creatinine, Ser: 0.95 mg/dL (ref 0.44–1.00)
GFR, Estimated: >60 mL/min (ref >60)
Glucose, Bld: 123 mg/dL — ABNORMAL HIGH (ref 70–99)
Potassium: 4.7 mmol/L (ref 3.5–5.1)
Sodium: 144 mmol/L (ref 135–145)

## 2022-04-08 LAB — MAGNESIUM: Magnesium: 1.7 mg/dL (ref 1.7–2.4)

## 2022-04-08 LAB — COMPREHENSIVE METABOLIC PANEL
ALT: 9 U/L (ref 0–44)
AST: 18 U/L (ref 15–41)
Albumin: 3.3 g/dL — ABNORMAL LOW (ref 3.5–5.0)
Alkaline Phosphatase: 63 U/L (ref 38–126)
Anion gap: 10 (ref 5–15)
BUN: 5 mg/dL — ABNORMAL LOW (ref 6–20)
CO2: 26 mmol/L (ref 22–32)
Calcium: 9.1 mg/dL (ref 8.9–10.3)
Chloride: 106 mmol/L (ref 98–111)
Creatinine, Ser: 0.85 mg/dL (ref 0.44–1.00)
GFR, Estimated: >60 mL/min (ref >60)
Glucose, Bld: 157 mg/dL — ABNORMAL HIGH (ref 70–99)
Potassium: 2.7 mmol/L — CL (ref 3.5–5.1)
Sodium: 142 mmol/L (ref 135–145)
Total Bilirubin: 1.3 mg/dL — ABNORMAL HIGH (ref 0.3–1.2)
Total Protein: 6.2 g/dL — ABNORMAL LOW (ref 6.5–8.1)

## 2022-04-08 LAB — GLUCOSE, CAPILLARY
Glucose-Capillary: 159 mg/dL — ABNORMAL HIGH (ref 70–99)
Glucose-Capillary: 184 mg/dL — ABNORMAL HIGH (ref 70–99)

## 2022-04-08 LAB — HIV ANTIBODY (ROUTINE TESTING W REFLEX): HIV Screen 4th Generation wRfx: NONREACTIVE

## 2022-04-08 MED ORDER — NICOTINE 7 MG/24HR TD PT24
7.0000 mg | MEDICATED_PATCH | Freq: Every day | TRANSDERMAL | Status: DC
  Filled 2022-04-08 (×3): qty 1

## 2022-04-08 MED ORDER — CLOPIDOGREL BISULFATE 75 MG PO TABS
75.0000 mg | ORAL_TABLET | Freq: Every day | ORAL | Status: DC
  Administered 2022-04-08: 75 mg via ORAL
  Filled 2022-04-08: qty 1

## 2022-04-08 MED ORDER — PERFLUTREN LIPID MICROSPHERE
1.0000 mL | INTRAVENOUS | Status: AC | PRN
  Administered 2022-04-08: 2 mL via INTRAVENOUS

## 2022-04-08 MED ORDER — HEPARIN (PORCINE) 25000 UT/250ML-% IV SOLN
850.0000 [IU]/h | INTRAVENOUS | Status: DC
  Filled 2022-04-08: qty 250

## 2022-04-08 MED ORDER — POTASSIUM CHLORIDE 10 MEQ/100ML IV SOLN
10.0000 meq | INTRAVENOUS | Status: AC
  Administered 2022-04-08 (×4): 10 meq via INTRAVENOUS
  Filled 2022-04-08 (×4): qty 100

## 2022-04-08 MED ORDER — POTASSIUM CHLORIDE 20 MEQ PO PACK
40.0000 meq | PACK | ORAL | Status: AC
  Administered 2022-04-08 (×2): 40 meq via ORAL
  Filled 2022-04-08 (×2): qty 2

## 2022-04-08 MED ORDER — INSULIN ASPART 100 UNIT/ML IJ SOLN
0.0000 [IU] | Freq: Three times a day (TID) | INTRAMUSCULAR | Status: DC
  Administered 2022-04-08: 2 [IU] via SUBCUTANEOUS

## 2022-04-08 MED ORDER — ASPIRIN 81 MG PO TBEC
81.0000 mg | DELAYED_RELEASE_TABLET | Freq: Every day | ORAL | Status: DC
Start: 2022-04-08 — End: 2022-04-08
  Administered 2022-04-08: 81 mg via ORAL
  Filled 2022-04-08: qty 1

## 2022-04-08 MED ORDER — MAGNESIUM SULFATE 2 GM/50ML IV SOLN
2.0000 g | Freq: Once | INTRAVENOUS | Status: AC
  Administered 2022-04-08: 2 g via INTRAVENOUS
  Filled 2022-04-08: qty 50

## 2022-04-08 NOTE — Progress Notes (Signed)
Speech Language Pathology Treatment:    Patient Details Name: RHETA HEMMELGARN MRN: [id number] DOB: [birth year] Today's Date: 04/08/2022 Time:  -     Pt seen for informal screen of speech-language-cognition and pt had no concerns and was surprised a "speech therapist" was coming to see her. Therapist explained the cognitive piece of therapy. Did not warrant full assessment. No further needs.      Royce Macadamia  04/08/2022, 1:24 PM

## 2022-04-08 NOTE — Assessment & Plan Note (Addendum)
MRI brain significant for acute left and right cerebellar infarcts. Gait not assessed this morning but otherwise normal neuro exam.  -Neurology consulted, appreciate care and recommendations provided for [name] -ASA and Plavix for 21 days, then continue ASA -f/u TTE -Cardiac monitoring -Continue Lipitor 40 mg daily -Swallow evaluation -Hemoglobin A1c -CMP -Physical therapy, Occupational Therapy evaluation -Vital signs per unit -Fall precautions

## 2022-04-08 NOTE — Assessment & Plan Note (Signed)
Patient reports smoking less than 1/2 pack/day.  Patient reports that she is a chronic smoker.  -Nicotine patch -Recommended smoking cessation given acute CVA

## 2022-04-08 NOTE — Progress Notes (Signed)
Patient refused bed alarm. Patient educated on importance of using call bell and bed alarm activation for high fall risk patients when needing to ambulate.  Melony Overly, RN

## 2022-04-08 NOTE — Progress Notes (Signed)
  Echocardiogram 2D Echocardiogram has been performed.  Grace Ferrell 04/08/2022, 3:50 PM

## 2022-04-08 NOTE — Progress Notes (Signed)
ANTICOAGULATION CONSULT NOTE - Initial Consult  Pharmacy Consult for heparin Indication:  LV thrombus, in the setting of acute CVAs on 6/18  Allergies  Allergen Reactions   Sulfamethoxazole-Trimethoprim Shortness Of Breath and Swelling    Airway involvement noted   Tessalon Perles [Benzonatate] Rash   Codeine Itching    With excoriations   Darvocet [Propoxyphene N-Acetaminophen] Nausea And Vomiting   Enalapril Cough    Dry cough   Morphine And Related Itching    With excoriations   Vicodin [Hydrocodone-Acetaminophen] Nausea Only and Cough    Patient Measurements: Height: 5\' 2"  (157.5 cm) Weight: 74.6 kg (164 lb 7.4 oz) IBW/kg (Calculated) : 50.1 Heparin Dosing Weight: 66 kg  Vital Signs: Temp: 97.5 F (36.4 C) (06/19 1707) Temp Source: Axillary (06/19 1707) BP: 144/76 (06/19 1707) Pulse Rate: 68 (06/19 1707)  Labs: Recent Labs    04/07/22 0847 04/08/22 0122 04/08/22 0616 04/08/22 1408  HGB 11.1* 10.5*  --   --   HCT 33.8* 31.8*  --   --   PLT 225 223  --   --   CREATININE 0.89  --  0.85 0.95    Estimated Creatinine Clearance: 61 mL/min (by C-G formula based on SCr of 0.95 mg/dL).   Medical History: Past Medical History:  Diagnosis Date   Diabetes mellitus    Hypercholesteremia    Hypertension     Medications:  Medications Prior to Admission  Medication Sig Dispense Refill Last Dose   atorvastatin (LIPITOR) 40 MG tablet Take 1 tablet (40 mg total) by mouth daily. 30 tablet 10 04/06/2022   carvedilol (COREG) 6.25 MG tablet Take 1 tablet (6.25 mg total) by mouth 2 (two) times daily with a meal. 180 tablet 2 04/06/2022 at 0800   cetirizine (ZYRTEC) 10 MG tablet Take 1 tablet (10 mg total) by mouth daily. 30 tablet 11 04/06/2022   famotidine (PEPCID) 20 MG tablet Take 1 tablet (20 mg total) by mouth 2 (two) times daily. 60 tablet 0 04/06/2022   furosemide (LASIX) 40 MG tablet Take 1 tablet (40 mg total) by mouth daily. 90 tablet 4 04/06/2022   glimepiride  (AMARYL) 4 MG tablet TAKE TWO TABLETS BY MOUTH EVERY MORNING WITH BREAKFAST (Patient taking differently: Take 8 mg by mouth daily with breakfast.) 180 tablet 0 04/06/2022   metFORMIN (GLUCOPHAGE) 1000 MG tablet TAKE ONE TABLET BY MOUTH TWICE A DAY WITH MEALS (Patient taking differently: Take 1,000 mg by mouth 2 (two) times daily with a meal.) 180 tablet 0 04/06/2022   Multiple Vitamins-Minerals (WOMENS MULTIVITAMIN) TABS Take 1 tablet by mouth daily.   04/06/2022   valsartan (DIOVAN) 40 MG tablet TAKE ONE TABLET BY MOUTH DAILY (Patient taking differently: Take 40 mg by mouth daily.) 90 tablet 0 04/06/2022   fluticasone (FLONASE) 50 MCG/ACT nasal spray Place 2 sprays into both nostrils daily. (Patient taking differently: Place 1 spray into both nostrils in the morning and at bedtime.) 16 g 6    Fluticasone-Umeclidin-Vilant (TRELEGY ELLIPTA) 200-62.5-25 MCG/ACT AEPB Inhale 1 puff into the lungs daily. 2 each 0     Assessment: Grace Ferrell is a 56 y.o. female here with acute stroke. Patient had ECHO done today that showed an LV thrombus. Pharmacy consulted to start heparin. Last dose of Lovenox 40 mg given 6/19 @1019 . Also received Aspirin and Plavix around the same time. Per FM team, neuro recommended discontinuing the aspirin and Plavix and start anticoagulation.   Goal of Therapy:  Heparin level 0.3-0.5 units/ml (will aim  for lower goal d/t 1 day post CVA) Monitor platelets by anticoagulation protocol: Yes   Plan:  Start heparin infusion at 850 units/hr Check anti-Xa level in 6 hours and daily while on heparin Continue to monitor H&H and platelets F/u oral AC plans (warfarin vs. DOAC)   Thank you for allowing [place] to participate in this patients care. Signe Colt, PharmD 04/08/2022 7:16 PM  **Pharmacist phone directory can be found on [email or web address] listed under [facility]**

## 2022-04-08 NOTE — Progress Notes (Signed)
     Daily Progress Note Intern Pager: [phone]  Patient name: Grace Ferrell Medical record number: [id number] Date of birth: 07-13-1966 Age: 56 y.o. Gender: female  Primary Care Provider: Maury Dus, MD Consultants: Neurology Code Status: DNR  Pt Overview and Major Events to Date:  6/18- admitted  Assessment and Plan: Grace Ferrell is a 56 y.o. female with PMHx of HFpEF, tobacco use, T2DM, and HTN presenting with dizziness in setting of 1 month of diarrhea and vomiting and found to have acute CVA.   CVA (cerebral vascular accident) (HCC) MRI brain significant for acute left and right cerebellar infarcts. Gait not assessed this morning but otherwise normal neuro exam.  -Neurology consulted, appreciate care and recommendations provided for Grace Ferrell -ASA and Plavix for 21 days, then continue ASA -f/u TTE -Cardiac monitoring -Continue Lipitor 40 mg daily -Swallow evaluation -Hemoglobin A1c -CMP -Physical therapy, Occupational Therapy evaluation -Vital signs per unit -Fall precautions   Hypokalemia K 2.7 this am -Potassium 40 mEq BID oral today -Potassium 10 mEq IV x4 -afternoon BMP  Chronic systolic heart failure (HCC)  Home medication includes Lasix 40 mg.  -hold Lasix 40 mg at this time -Repeat echocardiogram, see problem for acute CVA  Essential hypertension Holding home Coreg to allow permissive HTN -Hold coreg 6.25 mg BID  TOBACCO USER Patient reports smoking less than 1/2 pack/day.  Patient reports that she is a chronic smoker.  -Nicotine patch -Recommended smoking cessation given acute CVA   DM2 (diabetes mellitus, type 2) (HCC)  Home medications include metformin 1000 mg twice daily & glimepiride 4 mg daily. -Repeat hemoglobin A1c -CBG checks a.m. fasting and with meals -Sensitive sliding scale insulin -We will hold metformin and glimepiride while hospitalized    FEN/GI: Carb modified after swallow test PPx: Lovenox Dispo: Home pending continued  clinical management  Subjective:  Patient is difficult to awaken this morning, states she is very tired.  Denies any headache or discomfort  Objective: Pulse Rate:  [62-90] 66 (06/19 1030) Resp:  [9-28] 21 (06/19 1030) BP: (120-179)/(51-113) 120/101 (06/19 1030) SpO2:  [90 %-100 %] 100 % (06/19 1030) Weight:  [74.6 kg] 74.6 kg (06/19 1118) Physical Exam: General: 56 year old female, very sleepy, NAD Cardiovascular: RRR, normal S1/S2 Respiratory: CTAB, normal effort Abdomen: Bowel sounds present, soft, nontender to palpation, nondistended Neuro: Cranial nerves II through XII intact, sensation intact, finger-nose-finger test normal  Laboratory: Most recent CBC Lab Results  Component Value Date   WBC 7.3 04/08/2022   HGB 10.5 (L) 04/08/2022   HCT 31.8 (L) 04/08/2022   MCV 75.7 (L) 04/08/2022   PLT 223 04/08/2022   Most recent BMP    Latest Ref Rng & Units 04/08/2022    6:16 AM  BMP  Glucose 70 - 99 mg/dL 588   BUN 6 - 20 mg/dL 5   Creatinine 5.02 - 7.74 mg/dL 1.28   Sodium 786 - 767 mmol/L 142   Potassium 3.5 - 5.1 mmol/L 2.7   Chloride 98 - 111 mmol/L 106   CO2 22 - 32 mmol/L 26   Calcium 8.9 - 10.3 mg/dL 9.1      Erick Alley, DO 04/08/2022, 12:22 PM  PGY-1, [facility] Family Medicine FPTS Intern pager: [phone], text pages welcome Secure chat group [facility] Teaching Service

## 2022-04-08 NOTE — Assessment & Plan Note (Signed)
Holding home Coreg to allow permissive HTN -Hold coreg 6.25 mg BID

## 2022-04-08 NOTE — Progress Notes (Signed)
FPTS Interim Progress Note  S: Went to bedside to check on patient. Recent echo notable for EF 25% and prominent apical LV thrombus, with severely decreased left ventricular function. Discussed with the patient of her hospital course thus far and the new echo result. Patient is aware that she had a stroke and discussed starting anticoagulation with cardiology team. Patient is not wanting to initiate any anticoagulation. I conveyed to her that I understand but it is my job to discuss the recommendations as one of her medical providers so that she has the appropriate information to make an informed decision. She agreed and I then proceeded to explain to her the importance of anticoagulation, specifically heparin, as recommended by cardiology in order for prevention of new stroke. She says that she still does not want to proceed with anticoagulation. Denies chest pain, dyspnea or other symptoms.   O: BP (!) 144/76 (BP Location: Right Arm)   Pulse 68   Temp (!) 97.5 F (36.4 C) (Axillary)   Resp 20   Ht 5\' 2"  (1.575 m)   Wt 74.6 kg   SpO2 99%   BMI 30.08 kg/m   General: Patient laying comfortably in bed, in no acute distress. Resp: normal work of breathing  A/P: Patient presented with acute cerebellar stroke, now with LV thrombus. Cardiology consulted by day team, recommended heparin but patient refusing anticoagulation and further workup to optimize treatment for now. Continue remainder of plan per day team.   , DO 04/08/2022, 8:24 PM PGY-2, [facility] Family Medicine Service pager [phone]

## 2022-04-08 NOTE — Evaluation (Signed)
Physical Therapy Evaluation Patient Details Name: Grace Ferrell MRN: [id number] DOB: [birth year] Today's Date: 04/08/2022  History of Present Illness  Pt presented to ED on 6/18 with dizziness, vomiting and diarrhea. MRI showed acute left and rt cerebellar infarcts. PMH - DM, HTN, chf.  Clinical Impression  Pt presents to PT with some very, very mild balance deficits which she is able to self correct. Thankfully pt is mobilizing well because she isn't open to suggestions or recommendations and doubt she has an good understanding of her medical situation. From PT standpoint can return home.        Recommendations for follow up therapy are one component of a multi-disciplinary discharge planning process, led by the attending physician.  Recommendations may be updated based on patient status, additional functional criteria and insurance authorization.  Follow Up Recommendations No PT follow up    Assistance Recommended at Discharge PRN  Patient can return home with the following       Equipment Recommendations None recommended by PT  Recommendations for Other Services       Functional Status Assessment Patient has not had a recent decline in their functional status     Precautions / Restrictions Precautions Precautions: Fall Restrictions Weight Bearing Restrictions: No      Mobility  Bed Mobility               General bed mobility comments: Up in room with OT    Transfers Overall transfer level: Modified independent Equipment used: None                    Ambulation/Gait Ambulation/Gait assistance: Modified independent (Device/Increase time) Gait Distance (Feet): 250 Feet Assistive device: None Gait Pattern/deviations: WFL(Within Functional Limits) Gait velocity: normal Gait velocity interpretation: >4.37 ft/sec, indicative of normal walking speed   General Gait Details: Steady gait with normal gait. Occasional mild bobble with high level balance challenges  but able to self correct independently.  Stairs            Wheelchair Mobility    Modified Rankin (Stroke Patients Only)       Balance Overall balance assessment: Modified Independent                           High level balance activites: Side stepping, Backward walking, Direction changes, Turns, Sudden stops High Level Balance Comments: Pt able to perform all without assist. Pt also able to reach to floor and pick up object without assist.             Pertinent Vitals/Pain Pain Assessment Pain Assessment: No/denies pain    Home Living Family/patient expects to be discharged to:: Private residence Living Arrangements: Other relatives (Sister) Available Help at Discharge: Family (Pt takes care of her sister) Type of Home: Apartment Home Access: Level entry       Home Layout: One level Home Equipment: None      Prior Function Prior Level of Function : Independent/Modified Independent             Mobility Comments: No assistive device ADLs Comments: Cares for her sister. Performs ADLs and IADLs. does not drive     Hand Dominance        Extremity/Trunk Assessment   Upper Extremity Assessment Upper Extremity Assessment: Defer to OT evaluation    Lower Extremity Assessment Lower Extremity Assessment: Overall WFL for tasks assessed       Communication   Communication: No  difficulties  Cognition Arousal/Alertness: Awake/alert Behavior During Therapy: WFL for tasks assessed/performed Overall Cognitive Status: No family/caregiver present to determine baseline cognitive functioning                                 General Comments: Pt demonstrates impulsivity and decr awareness of deficits. This may be partially or wholly baseline.        General Comments      Exercises     Assessment/Plan    PT Assessment Patient does not need any further PT services  PT Problem List         PT Treatment Interventions       PT Goals (Current goals can be found in the Care Plan section)  Acute Rehab PT Goals PT Goal Formulation: All assessment and education complete, DC therapy    Frequency       Co-evaluation               AM-PAC PT "6 Clicks" Mobility  Outcome Measure Help needed turning from your back to your side while in a flat bed without using bedrails?: None Help needed moving from lying on your back to sitting on the side of a flat bed without using bedrails?: None Help needed moving to and from a bed to a chair (including a wheelchair)?: None Help needed standing up from a chair using your arms (e.g., wheelchair or bedside chair)?: None Help needed to walk in hospital room?: None Help needed climbing 3-5 steps with a railing? : None 6 Click Score: 24    End of Session   Activity Tolerance: Patient tolerated treatment well Patient left: in chair;with call bell/phone within reach   PT Visit Diagnosis: Unsteadiness on feet (R26.81)    Time: 1051-1100 PT Time Calculation (min) (ACUTE ONLY): 9 min   Charges:   PT Evaluation $PT Eval Low Complexity: 1 Low          [facility] PT Acute Rehabilitation Services Office [phone]   Angelina Ok [facility] 04/08/2022, 11:24 AM

## 2022-04-08 NOTE — Progress Notes (Addendum)
STROKE TEAM PROGRESS NOTE   INTERVAL HISTORY Pt seen in the ED. Sitting up in a chair without any concerns.  Patient denies focal stroke related symptoms and states she came for evaluation for vomiting and diarrhea and was lightheaded and dehydrated.  She states she is back to her baseline.  MRI showed 2 small punctate cerebellar infarcts. Vitals:   04/08/22 1020 04/08/22 1030 04/08/22 1118 04/08/22 1200  BP: 123/90 (!) 120/101  128/63  Pulse: 67 66  72  Resp: (!) 9 (!) 21  20  Temp:      SpO2: 99% 100%  100%  Weight:   74.6 kg   Height:   5\' 2"  (1.575 m)    CBC:  Recent Labs  Lab 04/07/22 0847 04/08/22 0122  WBC 7.4 7.3  HGB 11.1* 10.5*  HCT 33.8* 31.8*  MCV 77.0* 75.7*  PLT 225 223   Basic Metabolic Panel:  Recent Labs  Lab 04/07/22 0847 04/07/22 1119 04/08/22 0616  NA 142  --  142  K 3.4*  --  2.7*  CL 103  --  106  CO2 26  --  26  GLUCOSE 224*  --  157*  BUN 7  --  5*  CREATININE 0.89  --  0.85  CALCIUM 9.7  --  9.1  MG  --  1.4* 1.7    Lipid Panel:  Recent Labs  Lab 04/08/22 0616  CHOL 117  TRIG 99  HDL 28*  CHOLHDL 4.2  VLDL 20  LDLCALC 69    HgbA1c: 7.1 on 02/12/22 Urine Drug Screen:    IMAGING past 24 hours CT ANGIO HEAD NECK W WO CM  Result Date: 04/07/2022 CLINICAL DATA:  Dizziness, headache, acute infarct on same-day MRI EXAM: CT ANGIOGRAPHY HEAD AND NECK TECHNIQUE: Multidetector CT imaging of the head and neck was performed using the standard protocol during bolus administration of intravenous contrast. Multiplanar CT image reconstructions and MIPs were obtained to evaluate the vascular anatomy. Carotid stenosis measurements (when applicable) are obtained utilizing NASCET criteria, using the distal internal carotid diameter as the denominator. RADIATION DOSE REDUCTION: This exam was performed according to the departmental dose-optimization program which includes automated exposure control, adjustment of the mA and/or kV according to patient size  and/or use of iterative reconstruction technique. CONTRAST:  12mL OMNIPAQUE IOHEXOL 350 MG/ML SOLN COMPARISON:  No prior CTA, correlation is made with CT head 04/07/2022 and MRI and MRA head 04/07/2022 FINDINGS: CT HEAD FINDINGS For noncontrast findings, please see same day CT head. CTA NECK FINDINGS Aortic arch: Standard branching. Imaged portion shows no evidence of aneurysm or dissection. No significant stenosis of the major arch vessel origins. Aortic atherosclerosis. Right carotid system: No evidence of dissection, occlusion, or hemodynamically significant stenosis (greater than 50%). Atherosclerotic disease at the bifurcation and in the proximal ICA is not hemodynamically significant. Left carotid system: No evidence of dissection, occlusion, or hemodynamically significant stenosis (greater than 50%). Atherosclerotic disease at the bifurcation and in the proximal ICA is not hemodynamically significant. Vertebral arteries: The left vertebral artery is patent at its origin and is visualized in the proximal V1 segment but poorly visualized in the distal V1 segment and not definitively visualized in V2 and V3. The right vertebral artery is patent from its origin to the skull base. Skeleton: No acute osseous abnormality. Poor dentition with multiple dental caries. Other neck: Hypoenhancing foci in the thyroid, the largest of which is in the isthmus and left thyroid lobe, measuring up to 2.2 cm (  series 12, image 93). Upper chest: No focal pulmonary opacity or pleural effusion. Review of the MIP images confirms the above findings CTA HEAD FINDINGS Evaluation is somewhat limited by bolus timing. Anterior circulation: Poor visualization of the proximal petrous segment, which appears narrowed bilaterally but is normal in caliber distally. Both internal carotid arteries are patent to the termini, with mild narrowing in the distal right cavernous and proximal right-greater-than-left supraclinoid segment. Patent left A1.  The right A1 is not definitively visualized. Normal anterior communicating artery. Anterior cerebral arteries are patent to their distal aspects. No M1 stenosis or occlusion. Normal MCA bifurcations. Distal MCA branches perfused and symmetric. Posterior circulation: Non opacification of the left V4, with minimal distal opacification that is likely retrograde versus a left AICA. The right vertebral artery is patent to the vertebrobasilar junction with mild narrowing distally (series 8, image 129). The posteroinferior cerebellar arteries are not definitively seen. Basilar patent to its distal aspect. Superior cerebellar arteries patent proximally. Patent P1 segments. PCAs are mildly irregular but perfused to their distal aspects without stenosis. Possible left posterior communicating artery. Venous sinuses: As permitted by contrast timing, patent. Anatomic variants: None significant. Review of the MIP images confirms the above findings IMPRESSION: 1. Non opacification of the left vertebral artery from the distal V1 segment through the distal V4 segment, with some retrograde opacification at the vertebrobasilar junction, of indeterminate acuity. 2. No other hemodynamically significant stenosis in the neck. 3. Evaluation of the intracranial vasculature is somewhat limited by bolus timing. Within this limitation, no additional large vessel occlusion. Mild narrowing in the right cavernous and bilateral supraclinoid ICAs and distal right V4. The bilateral PICAs are not visualized, which could be secondary to bolus timing. 4. Hypoenhancing lesions in the thyroid, the largest of which measures up to 2.2 cm. This was most recently evaluated with ultrasound in 2011, at which time a biopsy was recommended but does not appear to have been performed. A non-emergent ultrasound of the thyroid is recommended. (Reference: J Am Coll Radiol. 2015 Feb;12(2): 143-50) Electronically Signed   By: Wiliam Ke M.D.   On: 04/07/2022 23:10    MR BRAIN WO CONTRAST  Result Date: 04/07/2022 CLINICAL DATA:  Dizziness, headache EXAM: MRI HEAD WITHOUT CONTRAST MRA HEAD WITHOUT CONTRAST TECHNIQUE: Multiplanar, multi-echo pulse sequences of the brain and surrounding structures were acquired without intravenous contrast. Angiographic images of the Circle of Willis were acquired using MRA technique without intravenous contrast. COMPARISON:  No prior MRI, correlation is made with CT head 04/07/2022 and 10/27/2009 FINDINGS: Evaluation is limited by motion artifact. In addition, the patient terminated the MRI early, after acquisition of only the diffusion-weighted and FLAIR sequences, which are motion degraded. MRI HEAD FINDINGS Restricted diffusion with ADC correlate in the medial right cerebellum (series 13, images 52-58 and series 11, image 51) and posterior left cerebellum (series 13, images 50-56 and series 11, images 46-48). MRA HEAD FINDINGS Anterior circulation: Both internal carotid arteries are patent to the cavernous portions, without significant stenosis; the supraclinoid portions are significantly motion degraded. A1 segments, anterior communicating artery, and proximal ACAs are motion degraded. The distal ACAs appear patent. M1 segments are motion degraded but grossly patent. Evaluation of more distal MCAs is limited. Posterior circulation: The right vertebral artery is patent to the vertebrobasilar junction. The left vertebral artery is not seen. Posterior inferior cerebral arteries not visualized. Basilar is grossly patent to its distal aspect. Superior cerebellar arteries visualized. Proximal PCAs are grossly patent. Anatomic variants: None appreciated. IMPRESSION:  Evaluation is significantly limited by motion artifact and early termination of the exam at the patient's request. Within this limitation, there are acute infarcts in the inferomedial right cerebellum and posteroinferior left cerebellum. The left vertebral artery is not visualized,  which is not favored to be artifactual but is of indeterminate acuity. Evaluation for the PICAs is limited by motion. Consider CTA head and neck for further evaluation. These results were called by telephone at the time of interpretation on 04/07/2022 at 7:36 pm to provider [facility] , who verbally acknowledged these results. Electronically Signed   By: Wiliam Ke M.D.   On: 04/07/2022 19:36   MR ANGIO HEAD WO CONTRAST  Result Date: 04/07/2022 CLINICAL DATA:  Dizziness, headache EXAM: MRI HEAD WITHOUT CONTRAST MRA HEAD WITHOUT CONTRAST TECHNIQUE: Multiplanar, multi-echo pulse sequences of the brain and surrounding structures were acquired without intravenous contrast. Angiographic images of the Circle of Willis were acquired using MRA technique without intravenous contrast. COMPARISON:  No prior MRI, correlation is made with CT head 04/07/2022 and 10/27/2009 FINDINGS: Evaluation is limited by motion artifact. In addition, the patient terminated the MRI early, after acquisition of only the diffusion-weighted and FLAIR sequences, which are motion degraded. MRI HEAD FINDINGS Restricted diffusion with ADC correlate in the medial right cerebellum (series 13, images 52-58 and series 11, image 51) and posterior left cerebellum (series 13, images 50-56 and series 11, images 46-48). MRA HEAD FINDINGS Anterior circulation: Both internal carotid arteries are patent to the cavernous portions, without significant stenosis; the supraclinoid portions are significantly motion degraded. A1 segments, anterior communicating artery, and proximal ACAs are motion degraded. The distal ACAs appear patent. M1 segments are motion degraded but grossly patent. Evaluation of more distal MCAs is limited. Posterior circulation: The right vertebral artery is patent to the vertebrobasilar junction. The left vertebral artery is not seen. Posterior inferior cerebral arteries not visualized. Basilar is grossly patent to its distal aspect.  Superior cerebellar arteries visualized. Proximal PCAs are grossly patent. Anatomic variants: None appreciated. IMPRESSION: Evaluation is significantly limited by motion artifact and early termination of the exam at the patient's request. Within this limitation, there are acute infarcts in the inferomedial right cerebellum and posteroinferior left cerebellum. The left vertebral artery is not visualized, which is not favored to be artifactual but is of indeterminate acuity. Evaluation for the PICAs is limited by motion. Consider CTA head and neck for further evaluation. These results were called by telephone at the time of interpretation on 04/07/2022 at 7:36 pm to provider [facility] , who verbally acknowledged these results. Electronically Signed   By: Wiliam Ke M.D.   On: 04/07/2022 19:36    PHYSICAL EXAM Pleasant middle-age African-American lady not in distress. . Afebrile. Head is nontraumatic. Neck is supple without bruit.    Cardiac exam no murmur or gallop. Lungs are clear to auscultation. Distal pulses are well felt.   Neurological Examination Mental Status: Awake and alert. Oriented x 5. Speech fluent without evidence of aphasia. Naming and comprehension intact. Able to follow all commands without difficulty. Cranial Nerves: II: Temporal visual fields intact with no extinction to DSS. PERRL.   III,IV, VI: EOMI. No nystagmus. No ptosis V: Temp sensation equal bilaterally  VII: Smile symmetric VIII: Hearing intact to voice IX,X: No hypophonia or hoarseness XI: Symmetric shoulder shrug XII: Midline tongue extension Motor: RUE 5/5 proximally and distally LUE 5/5 proximally and distally BLE 4+/5 in the context of poor effort.  No pronator drift Sensory: chronically Decreased temp  sensation to left shoulder region, otherwise BUE temp sensation is equal.  Subjectively does not feel the coolness of a stimulus to legs bilaterally.  FT intact to BUE and BLE.  Deep Tendon Reflexes: 2+  right biceps and brachioradialis, 1+ left biceps and brachioradialis. 2+ right patellar, 1+ left patellar. Toes downgoing bilaterally.  Cerebellar: No ataxia with FNF bilaterally. Poor cooperation with testing of BLE.  Gait: Deferred    ASSESSMENT/PLAN Ms. GRACYN ALLOR is a 56 y.o. female smoker with history of  type II DM,  HLD, and hypertension presenting with one month hisotry of vomiting and diarrhea. She presented to hospital for these symptoms. Patient remarks that she was heavily dehydrated and developed generalized weakness lower extremity weakness. She had been on aspirin but that was discontinued over one year ago. Limited brain MRI done showed bilateral cerebellar acute infarcts, at inferomedial right cerebellum and posteroinferior left cerebellum.    Stroke :  bilateral punctate cerebellar infarcts   likely secondary   due to small vessel disease Code Stroke  CT head No acute abnormality.   Small vessel disease. ASPECTS 10.     CTA head & neck  1. Non opacification of the left vertebral artery from the distal V1 segment through the distal V4 segment, with some retrograde opacification at the vertebrobasilar junction, of indeterminate acuity. 2. No other hemodynamically significant stenosis in the neck. 3. Evaluation of the intracranial vasculature is somewhat limited by bolus timing. Within this limitation, no additional large vessel occlusion. Mild narrowing in the right cavernous and bilateral supraclinoid ICAs and distal right V4. The bilateral PICAs are not visualized, which could be secondary to bolus timing.       MRI  brain: Evaluation is significantly limited by motion artifact and early termination of the exam at the patient's request. Within this limitation, there are acute infarcts in the inferomedial right cerebellum and posteroinferior left cerebellum.   2D Echo done and results pending    LDL 69 HgbA1c 7.1 VTE prophylaxis - scd     Diet   Diet heart  healthy/carb modified Room service appropriate? Yes; Fluid consistency: Thin   No antithrombotic prior to admission, now on aspirin 81 mg daily and clopidogrel 75 mg daily for 3 weeks; then continue with plavix alone  Therapy recommendations:    SLP: no needs OT: no follow up PT: no follow up   Disposition:  home without therapy   Hypertension Home meds:  valsartan  Stable Permissive hypertension (OK if < 220/120) but gradually normalize in 5-7 days Long-term BP goal normotensive  Hyperlipidemia Home meds:  lipitor 40mg ,  resumed in hospital LDL 69, goal < 70 Add    High intensity statin not indicated   Continue statin at discharge  Diabetes type II Controlled Home meds:  metformin 1gm twice daily  HgbA1c 7.1, goal < 7.0 CBGs Recent Labs    04/07/22 0844 04/08/22 0749 04/08/22 1148  GLUCAP 225* 152* 134*    SSI  Other Stroke Risk Factors   Cigarette smoker advised to stop smoking    Obesity, Body mass index is 30.08 kg/m., BMI >/= 30 associated with increased stroke risk, recommend weight loss, diet and exercise as appropriate       Obstructive sleep apnea,  on CPAP at home  Congestive heart failure: chronic systolic heart failure:  Patient home medication includes Lasix 40 mg.  Denies shortness of breath or dyspnea with exertion.  Denies swelling in legs on admission.  Last echocardiogram in  2016 with EF of 55-60%, grade 1 diastolic dysfunction and mild to moderate aortic valve regurgitation. -We will hold Lasix 40 mg at this time -Repeat echocardiogram, see problem for acute CVA    Hospital day # 1   I have personally obtained history,examined this patient, reviewed notes, independently viewed imaging studies, participated in medical decision making and plan of care.ROS completed by me personally and pertinent positives fully documented  I have made any additions or clarifications directly to the above note. Agree with note above.  Patient presented with episode  of vomiting diarrhea and likely dehydration and some gait instability and MRI shows punctate bilateral cerebellar infarcts likely from small vessel disease.  Recommend aspirin and Plavix for 3 weeks followed by Plavix alone and aggressive risk factor modification.  Continue ongoing stroke work-up.  Greater than 50% time during this 50-minute visit was spent on counseling and coordination of care about her strokes and discussion with care team and answering questions.  Delia Heady, MD Medical Director [facility] Pager: [phone] 04/08/2022 4:56 PM   To contact Stroke Continuity provider, please refer to [email or web address]. After hours, contact General Neurology

## 2022-04-08 NOTE — Assessment & Plan Note (Addendum)
Home medication includes Lasix 40 mg.  -hold Lasix 40 mg at this time -Repeat echocardiogram, see problem for acute CVA

## 2022-04-08 NOTE — Evaluation (Signed)
Occupational Therapy Evaluation Patient Details Name: Grace Ferrell MRN: [id number] DOB: 1966-07-18 Today's Date: 04/08/2022   History of Present Illness 56 yo female presented to ED on 6/18 with dizziness, vomiting and diarrhea. MRI showed acute left and rt cerebellar infarcts. PMH - DM, HTN, chf.   Clinical Impression   PTA, pt was living with her sister and was independent. Currently, pt requires Supervision for ADLs and functional mobility due to decrease awareness and safety. Pt presenting with impulsivity and decrease awareness; however, feel this is close to baseline cognition. Very independent and would like to return home. Recommend dc home once medically stable per physician. Will follow acutely to further assess IADLs and executive functioning as well as facilitate safe dc.      Recommendations for follow up therapy are one component of a multi-disciplinary discharge planning process, led by the attending physician.  Recommendations may be updated based on patient status, additional functional criteria and insurance authorization.   Follow Up Recommendations  No OT follow up    Assistance Recommended at Discharge PRN  Patient can return home with the following      Functional Status Assessment  Patient has had a recent decline in their functional status and demonstrates the ability to make significant improvements in function in a reasonable and predictable amount of time.  Equipment Recommendations  None recommended by OT    Recommendations for Other Services       Precautions / Restrictions Precautions Precautions: Fall Restrictions Weight Bearing Restrictions: No      Mobility Bed Mobility               General bed mobility comments: in chair upon arrival    Transfers Overall transfer level: Modified independent Equipment used: None                      Balance Overall balance assessment: Modified Independent                              High Level Balance Comments: Pt able to perform all without assist. Pt also able to reach to floor and pick up object without assist.           ADL either performed or assessed with clinical judgement   ADL Overall ADL's : Needs assistance/impaired                                       General ADL Comments: Pt performing grooming at sink, making her bed, and functional transfers with Supervision for safety due to impulsivity and decrease awareness.     Vision         Perception     Praxis      Pertinent Vitals/Pain Pain Assessment Pain Assessment: No/denies pain     Hand Dominance Right   Extremity/Trunk Assessment Upper Extremity Assessment Upper Extremity Assessment: Overall WFL for tasks assessed   Lower Extremity Assessment Lower Extremity Assessment: Defer to PT evaluation       Communication Communication Communication: No difficulties   Cognition Arousal/Alertness: Awake/alert Behavior During Therapy: WFL for tasks assessed/performed Overall Cognitive Status: No family/caregiver present to determine baseline cognitive functioning  General Comments: Pt demonstrates impulsivity and decr awareness of deficits. Pt dropping toothbrush and continued to "unwrap" the plastic; pt not noticing untill cued by therapist. Posey Rea if this is baseline cognition.     General Comments  VSS    Exercises     Shoulder Instructions      Home Living Family/patient expects to be discharged to:: Private residence Living Arrangements: Other relatives (Sister) Available Help at Discharge: Family (Pt takes care of her sister) Type of Home: Apartment Home Access: Level entry     Home Layout: One level     Bathroom Shower/Tub: Chief Strategy Officer: Standard     Home Equipment: None          Prior Functioning/Environment Prior Level of Function : Independent/Modified Independent              Mobility Comments: No assistive device ADLs Comments: Cares for her sister. Performs ADLs and IADLs. does not drive        OT Problem List:        OT Treatment/Interventions: Self-care/ADL training;Therapeutic exercise;DME and/or AE instruction;Cognitive remediation/compensation;Therapeutic activities;Patient/family education    OT Goals(Current goals can be found in the care plan section) Acute Rehab OT Goals Patient Stated Goal: Go home OT Goal Formulation: With patient Time For Goal Achievement: 04/22/22 Potential to Achieve Goals: Good  OT Frequency: Min 2X/week    Co-evaluation              AM-PAC OT "6 Clicks" Daily Activity     Outcome Measure Help from another person eating meals?: None Help from another person taking care of personal grooming?: A Little Help from another person toileting, which includes using toliet, bedpan, or urinal?: A Little Help from another person bathing (including washing, rinsing, drying)?: A Little Help from another person to put on and taking off regular upper body clothing?: None Help from another person to put on and taking off regular lower body clothing?: A Little 6 Click Score: 20   End of Session Nurse Communication: Mobility status  Activity Tolerance: Patient tolerated treatment well Patient left:  (In hallway with PT)  OT Visit Diagnosis: Other symptoms and signs involving cognitive function                Time: 6333-5456 OT Time Calculation (min): 22 min Charges:  OT General Charges $OT Visit: 1 Visit OT Evaluation $OT Eval Low Complexity: 1 Low  Frazier Balfour MSOT, OTR/L Acute Rehab Office: [phone]  Theodoro Grist Layken Beg 04/08/2022, 12:01 PM

## 2022-04-08 NOTE — Consult Note (Signed)
Cardiology Consultation:   Patient ID: ASYAH CANDLER MRN: [id number]; DOB: 04-22-1966  Admit date: 04/07/2022 Date of Consult: 04/08/2022  PCP:  Maury Dus, MD   [facility] Providers Cardiologist:  None    Patient Profile:   SANA TESSMER is a 56 y.o. female with a hx of systolic heart failure with EF 35-40% in 2015 that improved to 55% in 2016, HTN, DMII, HLD, tobacco abuse and noncompliance who is being seen 04/08/2022 for the evaluation of LV thrombus and systolic heart failure with EF 25% at the request of Dr. Lum Babe  History of Present Illness:   Ms. Abila is a 56 year old female with history as detailed above who was remotely seen by Dr. Daleen Squibb with last visit in 2016. She has a history of systolic heart failure initially diagnosed in 2015 with LVEF 35-40% that improved to 55% in 2016. This was thought to be due to hypertensive heart disease. Also with history of moderate AI and mild MR. She was lost to follow-up and has a history of significant medication noncompliance.  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 TTE obtained as part of the stroke work-up revealed EF 25% with global hypokinesis, G2DD, moderate RV dysfunction, moderate pulmonary HTN, mild MR, moderate AR, RAP . Cardiology was consulted for further management.  Currently, the patient is very guarded and states she is adamantly against taking blood thinners and several other medications due to "side effects." She denies any chest pain or SOB. States she feels "fine." We discussed the echo findings at length and we recommended anticoagulation as well as work-up for her severely reduced LVEF, but she declined. She would like to seek a second opinion. We discussed that she is very high risk for recurrent embolic event and death and she again declined any medications or further work-up.   Past Medical History:  Diagnosis Date   Diabetes mellitus    Hypercholesteremia     Hypertension     Past Surgical History:  Procedure Laterality Date   ENDOMETRIAL ABLATION     KNEE SURGERY     TUBAL LIGATION       Home Medications:  Prior to Admission medications   Medication Sig Start Date End Date Taking? Authorizing Provider  atorvastatin (LIPITOR) 40 MG tablet Take 1 tablet (40 mg total) by mouth daily. 11/05/21  Yes Maury Dus, MD  carvedilol (COREG) 6.25 MG tablet Take 1 tablet (6.25 mg total) by mouth 2 (two) times daily with a meal. 11/05/21  Yes Maury Dus, MD  cetirizine (ZYRTEC) 10 MG tablet Take 1 tablet (10 mg total) by mouth daily. 06/18/21  Yes Maury Dus, MD  famotidine (PEPCID) 20 MG tablet Take 1 tablet (20 mg total) by mouth 2 (two) times daily. 02/12/22  Yes Erick Alley, DO  furosemide (LASIX) 40 MG tablet Take 1 tablet (40 mg total) by mouth daily. 11/05/21  Yes Maury Dus, MD  glimepiride (AMARYL) 4 MG tablet TAKE TWO TABLETS BY MOUTH EVERY MORNING WITH BREAKFAST Patient taking differently: Take 8 mg by mouth daily with breakfast. 02/05/22  Yes Maury Dus, MD  metFORMIN (GLUCOPHAGE) 1000 MG tablet TAKE ONE TABLET BY MOUTH TWICE A DAY WITH MEALS Patient taking differently: Take 1,000 mg by mouth 2 (two) times daily with a meal. 02/05/22  Yes Maury Dus, MD  Multiple Vitamins-Minerals (WOMENS MULTIVITAMIN) TABS Take 1 tablet by mouth daily.   Yes [provider]  valsartan (DIOVAN) 40 MG tablet  TAKE ONE TABLET BY MOUTH DAILY Patient taking differently: Take 40 mg by mouth daily. 02/05/22  Yes Maury Dus, MD  fluticasone (FLONASE) 50 MCG/ACT nasal spray Place 2 sprays into both nostrils daily. Patient taking differently: Place 1 spray into both nostrils in the morning and at bedtime. 08/08/21   Maury Dus, MD  Fluticasone-Umeclidin-Vilant (TRELEGY ELLIPTA) 200-62.5-25 MCG/ACT AEPB Inhale 1 puff into the lungs daily. 01/15/22   Moses Manners, MD    Inpatient Medications: Scheduled Meds:   stroke: early  stages of recovery book   Does not apply Once   atorvastatin  40 mg Oral Daily   famotidine  20 mg Oral BID   fluticasone furoate-vilanterol  1 puff Inhalation Daily   And   umeclidinium bromide  1 puff Inhalation Daily   insulin aspart  0-9 Units Subcutaneous TID WC   nicotine  7 mg Transdermal Daily   Continuous Infusions:  heparin     PRN Meds:   Allergies:    Allergies  Allergen Reactions   Sulfamethoxazole-Trimethoprim Shortness Of Breath and Swelling    Airway involvement noted   Tessalon Perles [Benzonatate] Rash   Codeine Itching    With excoriations   Darvocet [Propoxyphene N-Acetaminophen] Nausea And Vomiting   Enalapril Cough    Dry cough   Morphine And Related Itching    With excoriations   Vicodin [Hydrocodone-Acetaminophen] Nausea Only and Cough    Social History:   Social History   Socioeconomic History   Marital status: Single    Spouse name: Not on file   Number of children: 3   Years of education: Not on file   Highest education level: Some college, no degree  Occupational History   Not on file  Tobacco Use   Smoking status: Every Day    Packs/day: 0.50    Years: 40.00    Total pack years: 20.00    Types: Cigarettes   Smokeless tobacco: Never   Tobacco comments:    Smoked 2.5 ppd for multiple (~5) years in her 39s.  Smoked 0.5-0.75 ppd for majority of adult life.  Quit with pregnancy (3x) and 1 additional longer quit in 2012 associated with a hospitalization.  Parents did not smoke. Daily smoker 16yo  Sister smokes  Vaping Use   Vaping Use: Never used  Substance and Sexual Activity   Alcohol use: No    Alcohol/week: 0.0 standard drinks of alcohol   Drug use: No   Sexual activity: Not Currently    Birth control/protection: Surgical  Other Topics Concern   Not on file  Social History Narrative   Not on file   Social Determinants of Health   Financial Resource Strain: Not on file  Food Insecurity: No Food Insecurity (08/16/2021)    Hunger Vital Sign    Worried About Running Out of Food in the Last Year: Never true    Ran Out of Food in the Last Year: Never true  Transportation Needs: No Transportation Needs (08/16/2021)   PRAPARE - Administrator, Civil Service (Medical): No    Lack of Transportation (Non-Medical): No  Physical Activity: Not on file  Stress: Not on file  Social Connections: Not on file  Intimate Partner Violence: Not on file    Family History:    Family History  Problem Relation Age of Onset   Stroke Mother    Hypertension Sister    Diabetes Sister    Breast cancer Sister    Hypertension Brother  Breast cancer Maternal Grandmother    Hypertension Brother    Hypertension Sister    Stroke Sister    Hypertension Sister      ROS:  Please see the history of present illness.   All other ROS reviewed and negative.     Physical Exam/Data:   Vitals:   04/08/22 1118 04/08/22 1200 04/08/22 1420 04/08/22 1707  BP:  128/63 (!) 136/51 (!) 144/76  Pulse:  72 72 68  Resp:  20 20   Temp:   97.6 F (36.4 C) (!) 97.5 F (36.4 C)  TempSrc:   Axillary Axillary  SpO2:  100% 95% 99%  Weight: 74.6 kg     Height:  (1.575 m)       Intake/Output Summary (Last 24 hours) at 04/08/2022 1946 Last data filed at 04/08/2022 1021 Gross per 24 hour  Intake 150 ml  Output --  Net 150 ml      04/08/2022   11:18 AM 02/12/2022   11:32 AM 02/12/2022    9:31 AM  Last 3 Weights  Weight (lbs) 164 lb 7.4 oz 164 lb 6.4 oz 164 lb 6.4 oz  Weight (kg) 74.6 kg 74.571 kg 74.571 kg     Body mass index is 30.08 kg/m.  General:  Comfortable, NAD HEENT: normal Neck: +JVD Vascular: No carotid bruits; Distal pulses 2+ bilaterally Cardiac:  RR, 2/6 systolic murmur Lungs:  clear to auscultation bilaterally, no wheezing, rhonchi or rales  Abd: soft, nontender, no hepatomegaly  Ext: no edema Musculoskeletal:  No deformities, BUE and BLE strength normal and equal Skin: warm and dry  Neuro:  CNs 2-12  intact, no focal abnormalities noted Psych:  Normal affect   EKG:  The EKG was personally reviewed and demonstrates:  No ECG this admission Telemetry:  Telemetry was personally reviewed and demonstrates:  NSR  Relevant CV Studies: TTE 04/08/22: IMPRESSIONS     1. Prominent apical LV thrombus. Left ventricular ejection fraction, by  estimation, is 25%. The left ventricle has severely decreased function.  The left ventricle demonstrates global hypokinesis. Left ventricular  diastolic parameters are consistent with   Grade II diastolic dysfunction (pseudonormalization).   2. Right ventricular systolic function is moderately reduced. The right  ventricular size is normal. There is moderately elevated pulmonary artery  systolic pressure.   3. Left atrial size was moderately dilated.   4. Right atrial size was mildly dilated.   5. The mitral valve is normal in structure. Mild to moderate mitral valve  regurgitation. No evidence of mitral stenosis.   6. Tricuspid valve regurgitation is mild to moderate.   7. The aortic valve is tricuspid. There is mild calcification of the  aortic valve. Aortic valve regurgitation is moderate. No aortic stenosis  is present.   8. The inferior vena cava is dilated in size with <50% respiratory  variability, suggesting right atrial pressure of 15 mmHg.  Laboratory Data:  High Sensitivity Troponin:  No results for input(s): "TROPONINIHS" in the last 720 hours.   Chemistry Recent Labs  Lab 04/07/22 0847 04/07/22 1119 04/08/22 0616 04/08/22 1408  NA 142  --  142 144  K 3.4*  --  2.7* 4.7  CL 103  --  106 109  CO2 26  --  26 24  GLUCOSE 224*  --  157* 123*  BUN 7  --  5* 5*  CREATININE 0.89  --  0.85 0.95  CALCIUM 9.7  --  9.1 9.1  MG  --  1.4* 1.7  --   GFRNONAA >60  --  >60 >60  ANIONGAP 13  --  10 11    Recent Labs  Lab 04/07/22 0847 04/08/22 0616  PROT 6.6 6.2*  ALBUMIN 3.6 3.3*  AST 23 18  ALT 9 9  ALKPHOS 65 63  BILITOT 0.9 1.3*    Lipids  Recent Labs  Lab 04/08/22 0616  CHOL 117  TRIG 99  HDL 28*  LDLCALC 69  CHOLHDL 4.2    Hematology Recent Labs  Lab 04/07/22 0847 04/08/22 0122  WBC 7.4 7.3  RBC 4.39 4.20  HGB 11.1* 10.5*  HCT 33.8* 31.8*  MCV 77.0* 75.7*  MCH 25.3* 25.0*  MCHC 32.8 33.0  RDW 16.6* 16.3*  PLT 225 223   Thyroid No results for input(s): "TSH", "FREET4" in the last 168 hours.  BNPNo results for input(s): "BNP", "PROBNP" in the last 168 hours.  DDimer No results for input(s): "DDIMER" in the last 168 hours.   Radiology/Studies:  ECHOCARDIOGRAM COMPLETE  Result Date: 04/08/2022    ECHOCARDIOGRAM REPORT   Patient Name:   MELLISSA CONLEY Date of Exam: 04/08/2022 Medical Rec #:  [id number]   Height:       62.0 in Accession #:    [id number]  Weight:       164.4 lb Date of Birth:  12-Mar-1966    BSA:          1.759 m Patient Age:    56 years    BP:           140/61 mmHg Patient Gender: F           HR:           74 bpm. Exam Location:  Inpatient Procedure: 2D Echo, Cardiac Doppler and Color Doppler REPORT CONTAINS CRITICAL RESULT Indications:    Stroke  History:        Patient has prior history of Echocardiogram examinations, most                 recent 12/23/2014. Risk Factors:Diabetes and Hypertension.  Sonographer:    Eulah Pont RDCS Referring Phys: [id number] Theador Hawthorne ENIOLA IMPRESSIONS  1. Prominent apical LV thrombus. Left ventricular ejection fraction, by estimation, is 25%. The left ventricle has severely decreased function. The left ventricle demonstrates global hypokinesis. Left ventricular diastolic parameters are consistent with  Grade II diastolic dysfunction (pseudonormalization).  2. Right ventricular systolic function is moderately reduced. The right ventricular size is normal. There is moderately elevated pulmonary artery systolic pressure.  3. Left atrial size was moderately dilated.  4. Right atrial size was mildly dilated.  5. The mitral valve is normal in structure. Mild to moderate mitral  valve regurgitation. No evidence of mitral stenosis.  6. Tricuspid valve regurgitation is mild to moderate.  7. The aortic valve is tricuspid. There is mild calcification of the aortic valve. Aortic valve regurgitation is moderate. No aortic stenosis is present.  8. The inferior vena cava is dilated in size with <50% respiratory variability, suggesting right atrial pressure of 15 mmHg. FINDINGS  Left Ventricle: Prominent apical LV thrombus. Left ventricular ejection fraction, by estimation, is 25%. The left ventricle has severely decreased function. The left ventricle demonstrates global hypokinesis. The left ventricular internal cavity size was normal in size. There is no left ventricular hypertrophy. Left ventricular diastolic parameters are consistent with Grade II diastolic dysfunction (pseudonormalization). Right Ventricle: The right ventricular size is normal. No increase in right ventricular wall thickness. Right  ventricular systolic function is moderately reduced. There is moderately elevated pulmonary artery systolic pressure. The tricuspid regurgitant velocity is 2.94 m/s, and with an assumed right atrial pressure of 15 mmHg, the estimated right ventricular systolic pressure is 49.6 mmHg. Left Atrium: Left atrial size was moderately dilated. Right Atrium: Right atrial size was mildly dilated. Pericardium: There is no evidence of pericardial effusion. Mitral Valve: The mitral valve is normal in structure. Mild to moderate mitral valve regurgitation. No evidence of mitral valve stenosis. Tricuspid Valve: The tricuspid valve is normal in structure. Tricuspid valve regurgitation is mild to moderate. No evidence of tricuspid stenosis. Aortic Valve: The aortic valve is tricuspid. There is mild calcification of the aortic valve. Aortic valve regurgitation is moderate. Aortic regurgitation PHT measures 307 msec. No aortic stenosis is present. Pulmonic Valve: The pulmonic valve was normal in structure. Pulmonic  valve regurgitation is trivial. No evidence of pulmonic stenosis. Aorta: The aortic root is normal in size and structure. Venous: The inferior vena cava is dilated in size with less than 50% respiratory variability, suggesting right atrial pressure of 15 mmHg. IAS/Shunts: No atrial level shunt detected by color flow Doppler.  LEFT VENTRICLE PLAX 2D LVIDd:         5.30 cm      Diastology LVIDs:         4.50 cm      LV e' medial:    2.81 cm/s LV PW:         1.00 cm      LV E/e' medial:  48.0 LV IVS:        1.00 cm      LV e' lateral:   3.77 cm/s LVOT diam:     2.00 cm      LV E/e' lateral: 35.8 LV SV:         66 LV SV Index:   38 LVOT Area:     3.14 cm  LV Volumes (MOD) LV vol d, MOD A2C: 98.8 ml LV vol d, MOD A4C: 131.0 ml LV vol s, MOD A2C: 65.3 ml LV vol s, MOD A4C: 80.1 ml LV SV MOD A2C:     33.5 ml LV SV MOD A4C:     131.0 ml LV SV MOD BP:      43.6 ml RIGHT VENTRICLE RV S prime:     8.67 cm/s TAPSE (M-mode): 1.8 cm LEFT ATRIUM             Index        RIGHT ATRIUM           Index LA diam:        5.30 cm 3.01 cm/m   RA Area:     17.00 cm LA Vol (A2C):   83.7 ml 47.59 ml/m  RA Volume:   47.60 ml  27.06 ml/m LA Vol (A4C):   62.7 ml 35.65 ml/m LA Biplane Vol: 76.0 ml 43.21 ml/m  AORTIC VALVE LVOT Vmax:         123.00 cm/s LVOT Vmean:        67.400 cm/s LVOT VTI:          0.210 m AI PHT:            307 msec AR Vena Contracta: 0.20 cm  AORTA Ao Root diam: 3.20 cm Ao Asc diam:  3.30 cm MITRAL VALVE                TRICUSPID VALVE MV Area (PHT): 5.38 cm  TR Peak grad:   34.6 mmHg MV Decel Time: 141 msec     TR Vmax:        294.00 cm/s MV E velocity: 135.00 cm/s MV A velocity: 71.90 cm/s   SHUNTS MV E/A ratio:  1.88         Systemic VTI:  0.21 m                             Systemic Diam: 2.00 cm Arvilla Meres MD Electronically signed by Arvilla Meres MD Signature Date/Time: 04/08/2022/2:47:36 PM    Final    CT ANGIO HEAD NECK W WO CM  Result Date: 04/07/2022 CLINICAL DATA:  Dizziness, headache, acute  infarct on same-day MRI EXAM: CT ANGIOGRAPHY HEAD AND NECK TECHNIQUE: Multidetector CT imaging of the head and neck was performed using the standard protocol during bolus administration of intravenous contrast. Multiplanar CT image reconstructions and MIPs were obtained to evaluate the vascular anatomy. Carotid stenosis measurements (when applicable) are obtained utilizing NASCET criteria, using the distal internal carotid diameter as the denominator. RADIATION DOSE REDUCTION: This exam was performed according to the departmental dose-optimization program which includes automated exposure control, adjustment of the mA and/or kV according to patient size and/or use of iterative reconstruction technique. CONTRAST:  60mL OMNIPAQUE IOHEXOL 350 MG/ML SOLN COMPARISON:  No prior CTA, correlation is made with CT head 04/07/2022 and MRI and MRA head 04/07/2022 FINDINGS: CT HEAD FINDINGS For noncontrast findings, please see same day CT head. CTA NECK FINDINGS Aortic arch: Standard branching. Imaged portion shows no evidence of aneurysm or dissection. No significant stenosis of the major arch vessel origins. Aortic atherosclerosis. Right carotid system: No evidence of dissection, occlusion, or hemodynamically significant stenosis (greater than 50%). Atherosclerotic disease at the bifurcation and in the proximal ICA is not hemodynamically significant. Left carotid system: No evidence of dissection, occlusion, or hemodynamically significant stenosis (greater than 50%). Atherosclerotic disease at the bifurcation and in the proximal ICA is not hemodynamically significant. Vertebral arteries: The left vertebral artery is patent at its origin and is visualized in the proximal V1 segment but poorly visualized in the distal V1 segment and not definitively visualized in V2 and V3. The right vertebral artery is patent from its origin to the skull base. Skeleton: No acute osseous abnormality. Poor dentition with multiple dental caries.  Other neck: Hypoenhancing foci in the thyroid, the largest of which is in the isthmus and left thyroid lobe, measuring up to 2.2 cm (series 12, image 93). Upper chest: No focal pulmonary opacity or pleural effusion. Review of the MIP images confirms the above findings CTA HEAD FINDINGS Evaluation is somewhat limited by bolus timing. Anterior circulation: Poor visualization of the proximal petrous segment, which appears narrowed bilaterally but is normal in caliber distally. Both internal carotid arteries are patent to the termini, with mild narrowing in the distal right cavernous and proximal right-greater-than-left supraclinoid segment. Patent left A1. The right A1 is not definitively visualized. Normal anterior communicating artery. Anterior cerebral arteries are patent to their distal aspects. No M1 stenosis or occlusion. Normal MCA bifurcations. Distal MCA branches perfused and symmetric. Posterior circulation: Non opacification of the left V4, with minimal distal opacification that is likely retrograde versus a left AICA. The right vertebral artery is patent to the vertebrobasilar junction with mild narrowing distally (series 8, image 129). The posteroinferior cerebellar arteries are not definitively seen. Basilar patent to its distal aspect. Superior cerebellar arteries patent proximally.  Patent P1 segments. PCAs are mildly irregular but perfused to their distal aspects without stenosis. Possible left posterior communicating artery. Venous sinuses: As permitted by contrast timing, patent. Anatomic variants: None significant. Review of the MIP images confirms the above findings IMPRESSION: 1. Non opacification of the left vertebral artery from the distal V1 segment through the distal V4 segment, with some retrograde opacification at the vertebrobasilar junction, of indeterminate acuity. 2. No other hemodynamically significant stenosis in the neck. 3. Evaluation of the intracranial vasculature is somewhat  limited by bolus timing. Within this limitation, no additional large vessel occlusion. Mild narrowing in the right cavernous and bilateral supraclinoid ICAs and distal right V4. The bilateral PICAs are not visualized, which could be secondary to bolus timing. 4. Hypoenhancing lesions in the thyroid, the largest of which measures up to 2.2 cm. This was most recently evaluated with ultrasound in 2011, at which time a biopsy was recommended but does not appear to have been performed. A non-emergent ultrasound of the thyroid is recommended. (Reference: J Am Coll Radiol. 2015 Feb;12(2): 143-50) Electronically Signed   By: Wiliam Ke M.D.   On: 04/07/2022 23:10   MR BRAIN WO CONTRAST  Result Date: 04/07/2022 CLINICAL DATA:  Dizziness, headache EXAM: MRI HEAD WITHOUT CONTRAST MRA HEAD WITHOUT CONTRAST TECHNIQUE: Multiplanar, multi-echo pulse sequences of the brain and surrounding structures were acquired without intravenous contrast. Angiographic images of the Circle of Willis were acquired using MRA technique without intravenous contrast. COMPARISON:  No prior MRI, correlation is made with CT head 04/07/2022 and 10/27/2009 FINDINGS: Evaluation is limited by motion artifact. In addition, the patient terminated the MRI early, after acquisition of only the diffusion-weighted and FLAIR sequences, which are motion degraded. MRI HEAD FINDINGS Restricted diffusion with ADC correlate in the medial right cerebellum (series 13, images 52-58 and series 11, image 51) and posterior left cerebellum (series 13, images 50-56 and series 11, images 46-48). MRA HEAD FINDINGS Anterior circulation: Both internal carotid arteries are patent to the cavernous portions, without significant stenosis; the supraclinoid portions are significantly motion degraded. A1 segments, anterior communicating artery, and proximal ACAs are motion degraded. The distal ACAs appear patent. M1 segments are motion degraded but grossly patent. Evaluation of  more distal MCAs is limited. Posterior circulation: The right vertebral artery is patent to the vertebrobasilar junction. The left vertebral artery is not seen. Posterior inferior cerebral arteries not visualized. Basilar is grossly patent to its distal aspect. Superior cerebellar arteries visualized. Proximal PCAs are grossly patent. Anatomic variants: None appreciated. IMPRESSION: Evaluation is significantly limited by motion artifact and early termination of the exam at the patient's request. Within this limitation, there are acute infarcts in the inferomedial right cerebellum and posteroinferior left cerebellum. The left vertebral artery is not visualized, which is not favored to be artifactual but is of indeterminate acuity. Evaluation for the PICAs is limited by motion. Consider CTA head and neck for further evaluation. These results were called by telephone at the time of interpretation on 04/07/2022 at 7:36 pm to provider [facility] , who verbally acknowledged these results. Electronically Signed   By: Wiliam Ke M.D.   On: 04/07/2022 19:36   MR ANGIO HEAD WO CONTRAST  Result Date: 04/07/2022 CLINICAL DATA:  Dizziness, headache EXAM: MRI HEAD WITHOUT CONTRAST MRA HEAD WITHOUT CONTRAST TECHNIQUE: Multiplanar, multi-echo pulse sequences of the brain and surrounding structures were acquired without intravenous contrast. Angiographic images of the Circle of Willis were acquired using MRA technique without intravenous contrast. COMPARISON:  No  prior MRI, correlation is made with CT head 04/07/2022 and 10/27/2009 FINDINGS: Evaluation is limited by motion artifact. In addition, the patient terminated the MRI early, after acquisition of only the diffusion-weighted and FLAIR sequences, which are motion degraded. MRI HEAD FINDINGS Restricted diffusion with ADC correlate in the medial right cerebellum (series 13, images 52-58 and series 11, image 51) and posterior left cerebellum (series 13, images 50-56 and  series 11, images 46-48). MRA HEAD FINDINGS Anterior circulation: Both internal carotid arteries are patent to the cavernous portions, without significant stenosis; the supraclinoid portions are significantly motion degraded. A1 segments, anterior communicating artery, and proximal ACAs are motion degraded. The distal ACAs appear patent. M1 segments are motion degraded but grossly patent. Evaluation of more distal MCAs is limited. Posterior circulation: The right vertebral artery is patent to the vertebrobasilar junction. The left vertebral artery is not seen. Posterior inferior cerebral arteries not visualized. Basilar is grossly patent to its distal aspect. Superior cerebellar arteries visualized. Proximal PCAs are grossly patent. Anatomic variants: None appreciated. IMPRESSION: Evaluation is significantly limited by motion artifact and early termination of the exam at the patient's request. Within this limitation, there are acute infarcts in the inferomedial right cerebellum and posteroinferior left cerebellum. The left vertebral artery is not visualized, which is not favored to be artifactual but is of indeterminate acuity. Evaluation for the PICAs is limited by motion. Consider CTA head and neck for further evaluation. These results were called by telephone at the time of interpretation on 04/07/2022 at 7:36 pm to provider [facility] , who verbally acknowledged these results. Electronically Signed   By: Wiliam Ke M.D.   On: 04/07/2022 19:36   CT HEAD WO CONTRAST  Result Date: 04/07/2022 CLINICAL DATA:  Dizziness EXAM: CT HEAD WITHOUT CONTRAST TECHNIQUE: Contiguous axial images were obtained from the base of the skull through the vertex without intravenous contrast. RADIATION DOSE REDUCTION: This exam was performed according to the departmental dose-optimization program which includes automated exposure control, adjustment of the mA and/or kV according to patient size and/or use of iterative  reconstruction technique. COMPARISON:  CT head 10/27/2009 FINDINGS: Brain: No acute intracranial hemorrhage, mass effect, or herniation. No extra-axial fluid collections. No evidence of acute territorial infarct. No hydrocephalus. Vascular: No hyperdense vessel or unexpected calcification. Skull: Normal. Negative for fracture or focal lesion. Sinuses/Orbits: No acute finding. Other: None. IMPRESSION: No acute intracranial process identified. Electronically Signed   By: Jannifer Hick M.D.   On: 04/07/2022 12:56     Assessment and Plan:   #Acute on Chronic Combined Systolic and Diastolic HF: #LV Thrombus: TTE with severely depressed LVEF 25% with LV thrombus present with recent stroke. Patient guarded on exam and unclear if she has been having any anginal symptoms but she denied chest pain, orthopnea, PND or edema. Had remotely seen Dr. Daleen Squibb in 2016. She has history of systolic HF with LVEF 35-40%>55-60% thought to be HTN induced, but she was lost to follow-up. She is adamantly declining ischemic work-up, medication optimization or anticoagulation. We discussed that she is very high risk for recurrent embolic event and death and she continued to decline further therapy/intervention/work-up. She would like a second opinion. Unfortunately, we are unable to do anything further unless patient becomes more amenable. 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 Refusing AC. Will continue management per primary team and neuro.  #HLD: -Continue lipitor 40mg  daily  #Noncompliance: Major barrier to care. Encouraged her to seek second opinion and bring all her records as she merits  treatment.    Risk Assessment/Risk Scores:        New [employer] Heart Association (NYHA) Functional Class NYHA Class II-III    Cardiology will sign-off. Please call back if patient becomes amenable to treatment.  For questions or updates, please contact [facility] Please consult [email or web address] for contact info under    Signed, Meriam Sprague, MD  04/08/2022 7:46 PM

## 2022-04-08 NOTE — ED Notes (Signed)
ED TO INPATIENT HANDOFF REPORT  ED Nurse Name and Phone #: [phone] [facility] RN  S Name/Age/Gender Grace Ferrell 56 y.o. female Room/Bed: 007C/007C  Code Status   Code Status: DNR  Home/SNF/Other Home Patient oriented to: self, place, time, and situation Is this baseline? Yes   Triage Complete: Triage complete  Chief Complaint Acute ischemic stroke (HCC) [I63.9] Stroke (HCC) [I63.9] CVA (cerebral vascular accident) [facility]) [I63.9]  Triage Note Patient complains of vomiting and diarrhea x 1 month and last night developed frontal headache. Patient states that her legs are weak and cannot stand. Denies abdominal pain, denies dysuria   Allergies Allergies  Allergen Reactions   Sulfamethoxazole-Trimethoprim Shortness Of Breath and Swelling    Airway involvement noted   Tessalon Perles [Benzonatate] Rash   Codeine Itching    With excoriations   Darvocet [Propoxyphene N-Acetaminophen] Nausea And Vomiting   Enalapril Cough    Dry cough   Morphine And Related Itching    With excoriations   Vicodin [Hydrocodone-Acetaminophen] Nausea Only and Cough    Level of Care/Admitting Diagnosis ED Disposition     ED Disposition  Admit   Condition  --   Comment  Hospital Area: [facility] [100100]  Level of Care: Telemetry Medical [104]  May place patient in observation at [facility] or [facility] if equivalent level of care is available:: No  Covid Evaluation: Asymptomatic - no recent exposure (last 10 days) testing not required  Diagnosis: CVA (cerebral vascular accident) [facility]) [[id number]]  Admitting Physician: Esmeralda Arthur  Attending Physician: Esmeralda Arthur          B Medical/Surgery History Past Medical History:  Diagnosis Date   Diabetes mellitus    Hypercholesteremia    Hypertension    Past Surgical History:  Procedure Laterality Date   ENDOMETRIAL ABLATION     KNEE SURGERY     TUBAL LIGATION       A IV  Location/Drains/Wounds Patient Lines/Drains/Airways Status     Active Line/Drains/Airways     Name Placement date Placement time Site Days   Peripheral IV 04/07/22 20 G Left Antecubital 04/07/22  1339  Antecubital  1   Peripheral IV 04/08/22 20 G Anterior;Left Forearm 04/08/22  0755  Forearm  less than 1            Intake/Output Last 24 hours  Intake/Output Summary (Last 24 hours) at 04/08/2022 1414 Last data filed at 04/08/2022 1021 Gross per 24 hour  Intake 196.78 ml  Output --  Net 196.78 ml    Labs/Imaging Results for orders placed or performed during the hospital encounter of 04/07/22 (from the past 48 hour(s))  CBG monitoring, ED     Status: Abnormal   Collection Time: 04/07/22  8:44 AM  Result Value Ref Range   Glucose-Capillary 225 (H) 70 - 99 mg/dL    Comment: Glucose reference range applies only to samples taken after fasting for at least 8 hours.  Lipase, blood     Status: None   Collection Time: 04/07/22  8:47 AM  Result Value Ref Range   Lipase 34 11 - 51 U/L    Comment: Performed at [facility], [address], [place], [place] [address]  Comprehensive metabolic panel     Status: Abnormal   Collection Time: 04/07/22  8:47 AM  Result Value Ref Range   Sodium 142 135 - 145 mmol/L   Potassium 3.4 (L) 3.5 - 5.1 mmol/L   Chloride  103 98 - 111 mmol/L   CO2 26 22 - 32 mmol/L   Glucose, Bld 224 (H) 70 - 99 mg/dL    Comment: Glucose reference range applies only to samples taken after fasting for at least 8 hours.   BUN 7 6 - 20 mg/dL   Creatinine, Ser 6.37 0.44 - 1.00 mg/dL   Calcium 9.7 8.9 - 85.8 mg/dL   Total Protein 6.6 6.5 - 8.1 g/dL   Albumin 3.6 3.5 - 5.0 g/dL   AST 23 15 - 41 U/L   ALT 9 0 - 44 U/L   Alkaline Phosphatase 65 38 - 126 U/L   Total Bilirubin 0.9 0.3 - 1.2 mg/dL   GFR, Estimated >85 >02 mL/min    Comment: (NOTE) Calculated using the CKD-EPI Creatinine Equation (2021)    Anion gap 13 5 - 15    Comment: Performed at [facility], [address], [place], [place] [address]  CBC     Status: Abnormal   Collection Time: 04/07/22  8:47 AM  Result Value Ref Range   WBC 7.4 4.0 - 10.5 K/uL   RBC 4.39 3.87 - 5.11 MIL/uL   Hemoglobin 11.1 (L) 12.0 - 15.0 g/dL   HCT 87.8 (L) 67.6 - 72.0 %   MCV 77.0 (L) 80.0 - 100.0 fL   MCH 25.3 (L) 26.0 - 34.0 pg   MCHC 32.8 30.0 - 36.0 g/dL   RDW 94.7 (H) 09.6 - 28.3 %   Platelets 225 150 - 400 K/uL   nRBC 0.0 0.0 - 0.2 %    Comment: Performed at [facility], [address], [place], [place] [address]  Magnesium     Status: Abnormal   Collection Time: 04/07/22 11:19 AM  Result Value Ref Range   Magnesium 1.4 (L) 1.7 - 2.4 mg/dL    Comment: Performed at [facility], [address], [place], [place] [address]  HIV Antibody (routine testing w rflx)     Status: None   Collection Time: 04/08/22  1:22 AM  Result Value Ref Range   HIV Screen 4th Generation wRfx Non Reactive Non Reactive    Comment: Performed at [facility], [address] [place], [place] [address]  CBC     Status: Abnormal   Collection Time: 04/08/22  1:22 AM  Result Value Ref Range   WBC 7.3 4.0 - 10.5 K/uL   RBC 4.20 3.87 - 5.11 MIL/uL   Hemoglobin 10.5 (L) 12.0 - 15.0 g/dL   HCT 65.6 (L) 81.2 - 75.1 %   MCV 75.7 (L) 80.0 - 100.0 fL   MCH 25.0 (L) 26.0 - 34.0 pg   MCHC 33.0 30.0 - 36.0 g/dL   RDW 70.0 (H) 17.4 - 94.4 %   Platelets 223 150 - 400 K/uL   nRBC 0.0 0.0 - 0.2 %    Comment: Performed at [facility], [address] [place], [place] [address]  Comprehensive metabolic panel     Status: Abnormal   Collection Time: 04/08/22  6:16 AM  Result Value Ref Range   Sodium 142 135 - 145 mmol/L   Potassium 2.7 (LL) 3.5 - 5.1 mmol/L    Comment: CRITICAL RESULT CALLED TO, READ BACK BY AND VERIFIED WITH: D.JAY,RN 0725 04/08/22 CLARK,S    Chloride 106 98 - 111 mmol/L   CO2 26 22 - 32 mmol/L   Glucose, Bld 157 (H) 70 - 99 mg/dL    Comment: Glucose  reference range applies  only to samples taken after fasting for at least 8 hours.   BUN 5 (L) 6 - 20 mg/dL   Creatinine, Ser 7.67 0.44 - 1.00 mg/dL   Calcium 9.1 8.9 - 34.1 mg/dL   Total Protein 6.2 (L) 6.5 - 8.1 g/dL   Albumin 3.3 (L) 3.5 - 5.0 g/dL   AST 18 15 - 41 U/L   ALT 9 0 - 44 U/L   Alkaline Phosphatase 63 38 - 126 U/L   Total Bilirubin 1.3 (H) 0.3 - 1.2 mg/dL   GFR, Estimated >93 >79 mL/min    Comment: (NOTE) Calculated using the CKD-EPI Creatinine Equation (2021)    Anion gap 10 5 - 15    Comment: Performed at [facility] [place] [facility], [address] [place], [place] [address]  Magnesium     Status: None   Collection Time: 04/08/22  6:16 AM  Result Value Ref Range   Magnesium 1.7 1.7 - 2.4 mg/dL    Comment: Performed at [facility], [address] [place], [place] [address]  Lipid panel     Status: Abnormal   Collection Time: 04/08/22  6:16 AM  Result Value Ref Range   Cholesterol 117 0 - 200 mg/dL   Triglycerides 99 <992 mg/dL   HDL 28 (L) >42 mg/dL   Total CHOL/HDL Ratio 4.2 RATIO   VLDL 20 0 - 40 mg/dL   LDL Cholesterol 69 0 - 99 mg/dL    Comment:        Total Cholesterol/HDL:CHD Risk Coronary Heart Disease Risk Table                     Men   Women  1/2 Average Risk   3.4   3.3  Average Risk       5.0   4.4  2 X Average Risk   9.6   7.1  3 X Average Risk  23.4   11.0        Use the calculated Patient Ratio above and the CHD Risk Table to determine the patient's CHD Risk.        ATP III CLASSIFICATION (LDL):  <100     mg/dL   Optimal  683-419  mg/dL   Near or Above                    Optimal  130-159  mg/dL   Borderline  622-297  mg/dL   High  >989     mg/dL   Very High Performed at [facility], [address] [place], [place] [address]   CBG monitoring, ED     Status: Abnormal   Collection Time: 04/08/22  7:49 AM  Result Value Ref Range   Glucose-Capillary 152 (H) 70 - 99 mg/dL    Comment: Glucose reference range applies only to samples taken after fasting  for at least 8 hours.   Comment 1 Notify RN    Comment 2 Document in Chart   Urinalysis, Routine w reflex microscopic     Status: Abnormal   Collection Time: 04/08/22 10:15 AM  Result Value Ref Range   Color, Urine YELLOW YELLOW   APPearance CLEAR CLEAR   Specific Gravity, Urine 1.040 (H) 1.005 - 1.030   pH 5.0 5.0 - 8.0   Glucose, UA NEGATIVE NEGATIVE mg/dL   Hgb urine dipstick NEGATIVE NEGATIVE   Bilirubin Urine NEGATIVE NEGATIVE   Ketones, ur 5 (A) NEGATIVE mg/dL  Protein, ur NEGATIVE NEGATIVE mg/dL   Nitrite NEGATIVE NEGATIVE   Leukocytes,Ua NEGATIVE NEGATIVE    Comment: Performed at [facility], [address] [place], [place] [address]  CBG monitoring, ED     Status: Abnormal   Collection Time: 04/08/22 11:48 AM  Result Value Ref Range   Glucose-Capillary 134 (H) 70 - 99 mg/dL    Comment: Glucose reference range applies only to samples taken after fasting for at least 8 hours.   CT ANGIO HEAD NECK W WO CM  Result Date: 04/07/2022 CLINICAL DATA:  Dizziness, headache, acute infarct on same-day MRI EXAM: CT ANGIOGRAPHY HEAD AND NECK TECHNIQUE: Multidetector CT imaging of the head and neck was performed using the standard protocol during bolus administration of intravenous contrast. Multiplanar CT image reconstructions and MIPs were obtained to evaluate the vascular anatomy. Carotid stenosis measurements (when applicable) are obtained utilizing NASCET criteria, using the distal internal carotid diameter as the denominator. RADIATION DOSE REDUCTION: This exam was performed according to the departmental dose-optimization program which includes automated exposure control, adjustment of the mA and/or kV according to patient size and/or use of iterative reconstruction technique. CONTRAST:  63mL OMNIPAQUE IOHEXOL 350 MG/ML SOLN COMPARISON:  No prior CTA, correlation is made with CT head 04/07/2022 and MRI and MRA head 04/07/2022 FINDINGS: CT HEAD FINDINGS For noncontrast findings,  please see same day CT head. CTA NECK FINDINGS Aortic arch: Standard branching. Imaged portion shows no evidence of aneurysm or dissection. No significant stenosis of the major arch vessel origins. Aortic atherosclerosis. Right carotid system: No evidence of dissection, occlusion, or hemodynamically significant stenosis (greater than 50%). Atherosclerotic disease at the bifurcation and in the proximal ICA is not hemodynamically significant. Left carotid system: No evidence of dissection, occlusion, or hemodynamically significant stenosis (greater than 50%). Atherosclerotic disease at the bifurcation and in the proximal ICA is not hemodynamically significant. Vertebral arteries: The left vertebral artery is patent at its origin and is visualized in the proximal V1 segment but poorly visualized in the distal V1 segment and not definitively visualized in V2 and V3. The right vertebral artery is patent from its origin to the skull base. Skeleton: No acute osseous abnormality. Poor dentition with multiple dental caries. Other neck: Hypoenhancing foci in the thyroid, the largest of which is in the isthmus and left thyroid lobe, measuring up to 2.2 cm (series 12, image 93). Upper chest: No focal pulmonary opacity or pleural effusion. Review of the MIP images confirms the above findings CTA HEAD FINDINGS Evaluation is somewhat limited by bolus timing. Anterior circulation: Poor visualization of the proximal petrous segment, which appears narrowed bilaterally but is normal in caliber distally. Both internal carotid arteries are patent to the termini, with mild narrowing in the distal right cavernous and proximal right-greater-than-left supraclinoid segment. Patent left A1. The right A1 is not definitively visualized. Normal anterior communicating artery. Anterior cerebral arteries are patent to their distal aspects. No M1 stenosis or occlusion. Normal MCA bifurcations. Distal MCA branches perfused and symmetric. Posterior  circulation: Non opacification of the left V4, with minimal distal opacification that is likely retrograde versus a left AICA. The right vertebral artery is patent to the vertebrobasilar junction with mild narrowing distally (series 8, image 129). The posteroinferior cerebellar arteries are not definitively seen. Basilar patent to its distal aspect. Superior cerebellar arteries patent proximally. Patent P1 segments. PCAs are mildly irregular but perfused to their distal aspects without stenosis. Possible left posterior communicating artery. Venous sinuses: As permitted by contrast  timing, patent. Anatomic variants: None significant. Review of the MIP images confirms the above findings IMPRESSION: 1. Non opacification of the left vertebral artery from the distal V1 segment through the distal V4 segment, with some retrograde opacification at the vertebrobasilar junction, of indeterminate acuity. 2. No other hemodynamically significant stenosis in the neck. 3. Evaluation of the intracranial vasculature is somewhat limited by bolus timing. Within this limitation, no additional large vessel occlusion. Mild narrowing in the right cavernous and bilateral supraclinoid ICAs and distal right V4. The bilateral PICAs are not visualized, which could be secondary to bolus timing. 4. Hypoenhancing lesions in the thyroid, the largest of which measures up to 2.2 cm. This was most recently evaluated with ultrasound in 2011, at which time a biopsy was recommended but does not appear to have been performed. A non-emergent ultrasound of the thyroid is recommended. (Reference: J Am Coll Radiol. 2015 Feb;12(2): 143-50) Electronically Signed   By: Wiliam Ke M.D.   On: 04/07/2022 23:10   MR BRAIN WO CONTRAST  Result Date: 04/07/2022 CLINICAL DATA:  Dizziness, headache EXAM: MRI HEAD WITHOUT CONTRAST MRA HEAD WITHOUT CONTRAST TECHNIQUE: Multiplanar, multi-echo pulse sequences of the brain and surrounding structures were acquired  without intravenous contrast. Angiographic images of the Circle of Willis were acquired using MRA technique without intravenous contrast. COMPARISON:  No prior MRI, correlation is made with CT head 04/07/2022 and 10/27/2009 FINDINGS: Evaluation is limited by motion artifact. In addition, the patient terminated the MRI early, after acquisition of only the diffusion-weighted and FLAIR sequences, which are motion degraded. MRI HEAD FINDINGS Restricted diffusion with ADC correlate in the medial right cerebellum (series 13, images 52-58 and series 11, image 51) and posterior left cerebellum (series 13, images 50-56 and series 11, images 46-48). MRA HEAD FINDINGS Anterior circulation: Both internal carotid arteries are patent to the cavernous portions, without significant stenosis; the supraclinoid portions are significantly motion degraded. A1 segments, anterior communicating artery, and proximal ACAs are motion degraded. The distal ACAs appear patent. M1 segments are motion degraded but grossly patent. Evaluation of more distal MCAs is limited. Posterior circulation: The right vertebral artery is patent to the vertebrobasilar junction. The left vertebral artery is not seen. Posterior inferior cerebral arteries not visualized. Basilar is grossly patent to its distal aspect. Superior cerebellar arteries visualized. Proximal PCAs are grossly patent. Anatomic variants: None appreciated. IMPRESSION: Evaluation is significantly limited by motion artifact and early termination of the exam at the patient's request. Within this limitation, there are acute infarcts in the inferomedial right cerebellum and posteroinferior left cerebellum. The left vertebral artery is not visualized, which is not favored to be artifactual but is of indeterminate acuity. Evaluation for the PICAs is limited by motion. Consider CTA head and neck for further evaluation. These results were called by telephone at the time of interpretation on 04/07/2022  at 7:36 pm to provider [facility] , who verbally acknowledged these results. Electronically Signed   By: Wiliam Ke M.D.   On: 04/07/2022 19:36   MR ANGIO HEAD WO CONTRAST  Result Date: 04/07/2022 CLINICAL DATA:  Dizziness, headache EXAM: MRI HEAD WITHOUT CONTRAST MRA HEAD WITHOUT CONTRAST TECHNIQUE: Multiplanar, multi-echo pulse sequences of the brain and surrounding structures were acquired without intravenous contrast. Angiographic images of the Circle of Willis were acquired using MRA technique without intravenous contrast. COMPARISON:  No prior MRI, correlation is made with CT head 04/07/2022 and 10/27/2009 FINDINGS: Evaluation is limited by motion artifact. In addition, the patient terminated the MRI early,  after acquisition of only the diffusion-weighted and FLAIR sequences, which are motion degraded. MRI HEAD FINDINGS Restricted diffusion with ADC correlate in the medial right cerebellum (series 13, images 52-58 and series 11, image 51) and posterior left cerebellum (series 13, images 50-56 and series 11, images 46-48). MRA HEAD FINDINGS Anterior circulation: Both internal carotid arteries are patent to the cavernous portions, without significant stenosis; the supraclinoid portions are significantly motion degraded. A1 segments, anterior communicating artery, and proximal ACAs are motion degraded. The distal ACAs appear patent. M1 segments are motion degraded but grossly patent. Evaluation of more distal MCAs is limited. Posterior circulation: The right vertebral artery is patent to the vertebrobasilar junction. The left vertebral artery is not seen. Posterior inferior cerebral arteries not visualized. Basilar is grossly patent to its distal aspect. Superior cerebellar arteries visualized. Proximal PCAs are grossly patent. Anatomic variants: None appreciated. IMPRESSION: Evaluation is significantly limited by motion artifact and early termination of the exam at the patient's request. Within this  limitation, there are acute infarcts in the inferomedial right cerebellum and posteroinferior left cerebellum. The left vertebral artery is not visualized, which is not favored to be artifactual but is of indeterminate acuity. Evaluation for the PICAs is limited by motion. Consider CTA head and neck for further evaluation. These results were called by telephone at the time of interpretation on 04/07/2022 at 7:36 pm to provider [facility] , who verbally acknowledged these results. Electronically Signed   By: Wiliam Ke M.D.   On: 04/07/2022 19:36   CT HEAD WO CONTRAST  Result Date: 04/07/2022 CLINICAL DATA:  Dizziness EXAM: CT HEAD WITHOUT CONTRAST TECHNIQUE: Contiguous axial images were obtained from the base of the skull through the vertex without intravenous contrast. RADIATION DOSE REDUCTION: This exam was performed according to the departmental dose-optimization program which includes automated exposure control, adjustment of the mA and/or kV according to patient size and/or use of iterative reconstruction technique. COMPARISON:  CT head 10/27/2009 FINDINGS: Brain: No acute intracranial hemorrhage, mass effect, or herniation. No extra-axial fluid collections. No evidence of acute territorial infarct. No hydrocephalus. Vascular: No hyperdense vessel or unexpected calcification. Skull: Normal. Negative for fracture or focal lesion. Sinuses/Orbits: No acute finding. Other: None. IMPRESSION: No acute intracranial process identified. Electronically Signed   By: Jannifer Hick M.D.   On: 04/07/2022 12:56    Pending Labs Unresulted Labs (From admission, onward)     Start     Ordered   04/09/22 0500  Basic metabolic panel  Tomorrow morning,   R        04/08/22 0743   04/08/22 1400  Basic metabolic panel  Once,   R        04/08/22 1019   04/07/22 2351  Hemoglobin A1c  (Labs)  Once,   R       Comments: To assess prior glycemic control    04/07/22 2353            Vitals/Pain Today's  Vitals   04/08/22 1030 04/08/22 1115 04/08/22 1118 04/08/22 1200  BP: (!) 120/101   128/63  Pulse: 66   72  Resp: (!) 21   20  Temp:      SpO2: 100%   100%  Weight:   74.6 kg   Height:   5\' 2"  (1.575 m)   PainSc:  0-No pain      Isolation Precautions No active isolations  Medications Medications  atorvastatin (LIPITOR) tablet 40 mg (40 mg Oral Given 04/08/22 1018)  famotidine (  PEPCID) tablet 20 mg (20 mg Oral Given 04/08/22 1018)  fluticasone furoate-vilanterol (BREO ELLIPTA) 200-25 MCG/ACT 1 puff (0 puffs Inhalation Hold 04/08/22 0848)    And  umeclidinium bromide (INCRUSE ELLIPTA) 62.5 MCG/ACT 1 puff (0 puffs Inhalation Hold 04/08/22 0848)   stroke: early stages of recovery book (has no administration in time range)  enoxaparin (LOVENOX) injection 40 mg (40 mg Subcutaneous Given 04/08/22 1019)  insulin aspart (novoLOG) injection 0-9 Units ( Subcutaneous Patient Refused/Not Given 04/08/22 1149)  nicotine (NICODERM CQ - dosed in mg/24 hr) patch 7 mg (7 mg Transdermal Patient Refused/Not Given 04/08/22 1018)  aspirin EC tablet 81 mg (81 mg Oral Given 04/08/22 1018)  clopidogrel (PLAVIX) tablet 75 mg (75 mg Oral Given 04/08/22 1017)  LORazepam (ATIVAN) injection 1 mg (1 mg Intravenous Given 04/07/22 1348)  meclizine (ANTIVERT) tablet 50 mg (50 mg Oral Given 04/07/22 1345)  magnesium sulfate IVPB 2 g 50 mL (0 g Intravenous Stopped 04/07/22 1505)  iohexol (OMNIPAQUE) 350 MG/ML injection 60 mL (60 mLs Intravenous Contrast Given 04/07/22 2237)  potassium chloride (KLOR-CON) packet 40 mEq (40 mEq Oral Given 04/08/22 1225)  potassium chloride 10 mEq in 100 mL IVPB (0 mEq Intravenous Stopped 04/08/22 1317)  magnesium sulfate IVPB 2 g 50 mL (0 g Intravenous Stopped 04/08/22 0853)    Mobility walks Moderate fall risk   Focused Assessments Neuro Assessment Handoff:  Swallow screen pass? Yes  Cardiac Rhythm: Normal sinus rhythm NIH Stroke Scale ( + Modified Stroke Scale Criteria)  Interval: Shift  assessment Level of Consciousness (1a.)   : Alert, keenly responsive LOC Questions (1b. )   +: Answers both questions correctly LOC Commands (1c. )   + : Performs both tasks correctly Best Gaze (2. )  +: Normal Visual (3. )  +: No visual loss Facial Palsy (4. )    : Normal symmetrical movements Motor Arm, Left (5a. )   +: No drift Motor Arm, Right (5b. )   +: No drift Motor Leg, Left (6a. )   +: No drift Motor Leg, Right (6b. )   +: No drift Limb Ataxia (7. ): Absent Sensory (8. )   +: Normal, no sensory loss Best Language (9. )   +: No aphasia Dysarthria (10. ): Normal Extinction/Inattention (11.)   +: No Abnormality Modified SS Total  +: 0 Complete NIHSS TOTAL: 0     Neuro Assessment: Within Defined Limits Neuro Checks:   Shift assessment (04/08/22 0300)  Last Documented NIHSS Modified Score: 0 (04/08/22 0700) Has TPA been given? No If patient is a Neuro Trauma and patient is going to OR before floor call report to 4N Charge nurse: [phone] or [phone]   R Recommendations: See Admitting Provider Note  Report given to:   Additional Notes: Pt came in for vomiting and diarrhea for the past month, pt started developing new frontal headache and weakness. K+ found to be 2.6, 4 IV runs given, 40 mEq given x2. A&Ox4, ambulates to bathroom, denies weakness for this RN. NIH 0.

## 2022-04-08 NOTE — Hospital Course (Signed)
Grace Ferrell is a 56 y.o. female with PMHx of HFpEF, tobacco use, T2DM, and HTN presenting with dizziness in setting of 1 month of diarrhea and vomiting and found to have acute CVA.   Cerebellar Infarcts  Pt found to have bilateral cerebellar infarcts on brain MRI. CT head and neck imaging was notable for narrowing  of the right cavernous and bilateral supraclinoid ICAs and distal right V4. She was evaluated by neurology with initial recommendation for starting DAPT.  Ultimately the patient refused Plavix but agreed to continue aspirin and Lipitor.  Neuro exams were normal throughout hospitalization.  Left ventricular thrombus Echocardiogram on 04/08/2022 showed LV thrombus, likely cause of cerebellar infarcts.  Neurology recommended discontinuing DAPT and starting patient on blood thinner with consult to cardiology.  Cardiology saw patient and recommended heparin drip with plans to transition to warfarin.  Patient adamantly refused anticoagulation treatment.  Cardiology noted that risks including death, heart attack, stroke or other embolic events were discussed at length and patient still declined treatment.  The same risks for subsequently discussed with family medicine and patient continued to refuse treatment.  Chronic systolic heart failure (HCC) Echocardiogram completed 04/08/22 and showed EF 25%, G2DD, LV thrombus, pulmonary hypertension, mild MR, moderate AR, moderate RV dysfunction. Home medication  Lasix 40 mg was held during admission but continued at discharge.  Cardiology was consulted who recommended further work-up considering last EF was 55% in 2016.  Patient adamantly declined further work-up with cardiology in the hospital.    Essential hypertension Home Coreg was held to allow for permissive hypertension and was restarted upon discharge.  Hypokalemia Potassium of 2.7 was appropriately repleted.  Mag was 1.7 and repleted. Patient was discharged with potassium 20 mEq daily for 8 days  and following up with PCP to have BMP.  T2DM Patient had A1c upon admission of 6.6.  She was treated with sensitive SSI while hospitalized.  Home metformin and glimepiride were continued upon discharge.  Issues for Follow up with PCP  CTA of the head and neck showed hypoenhancing lesions in the thyroid with largest measuring 2.2 cm.  These were present on ultrasound from 2011. Consider thyroid ultrasound outpatient Repeat BMP to check potassium Ensure follow-up with outpatient cardiologist Consider further work up for patient's microcytic anemia

## 2022-04-08 NOTE — Assessment & Plan Note (Addendum)
Home medications include metformin 1000 mg twice daily & glimepiride 4 mg daily. -Repeat hemoglobin A1c -CBG checks a.m. fasting and with meals -Sensitive sliding scale insulin -We will hold metformin and glimepiride while hospitalized

## 2022-04-08 NOTE — ED Notes (Signed)
Echo at bedside

## 2022-04-09 ENCOUNTER — Other Ambulatory Visit (HOSPITAL_COMMUNITY): Payer: Self-pay

## 2022-04-09 DIAGNOSIS — I639 Cerebral infarction, unspecified: Principal | ICD-10-CM

## 2022-04-09 LAB — HEMOGLOBIN A1C
Hgb A1c MFr Bld: 6.6 % — ABNORMAL HIGH (ref 4.8–5.6)
Mean Plasma Glucose: 143 mg/dL

## 2022-04-09 LAB — BASIC METABOLIC PANEL
Anion gap: 9 (ref 5–15)
BUN: 7 mg/dL (ref 6–20)
CO2: 21 mmol/L — ABNORMAL LOW (ref 22–32)
Calcium: 9.1 mg/dL (ref 8.9–10.3)
Chloride: 110 mmol/L (ref 98–111)
Creatinine, Ser: 0.93 mg/dL (ref 0.44–1.00)
GFR, Estimated: >60 mL/min (ref >60)
Glucose, Bld: 155 mg/dL — ABNORMAL HIGH (ref 70–99)
Potassium: 3.9 mmol/L (ref 3.5–5.1)
Sodium: 140 mmol/L (ref 135–145)

## 2022-04-09 LAB — CBC
HCT: 32.1 % — ABNORMAL LOW (ref 36.0–46.0)
Hemoglobin: 10.6 g/dL — ABNORMAL LOW (ref 12.0–15.0)
MCH: 25 pg — ABNORMAL LOW (ref 26.0–34.0)
MCHC: 33 g/dL (ref 30.0–36.0)
MCV: 75.7 fL — ABNORMAL LOW (ref 80.0–100.0)
Platelets: 206 10*3/uL (ref 150–400)
RBC: 4.24 MIL/uL (ref 3.87–5.11)
RDW: 16.5 % — ABNORMAL HIGH (ref 11.5–15.5)
WBC: 7 10*3/uL (ref 4.0–10.5)
nRBC: 0 % (ref 0.0–0.2)

## 2022-04-09 LAB — GLUCOSE, CAPILLARY
Glucose-Capillary: 140 mg/dL — ABNORMAL HIGH (ref 70–99)
Glucose-Capillary: 177 mg/dL — ABNORMAL HIGH (ref 70–99)

## 2022-04-09 MED ORDER — POTASSIUM CHLORIDE CRYS ER 20 MEQ PO TBCR
20.0000 meq | EXTENDED_RELEASE_TABLET | Freq: Every day | ORAL | 0 refills | Status: DC
  Filled 2022-04-09: qty 8, 8d supply, fill #0

## 2022-04-09 MED ORDER — ASPIRIN 325 MG PO TBEC
325.0000 mg | DELAYED_RELEASE_TABLET | Freq: Every day | ORAL | 3 refills | Status: AC
  Filled 2022-04-09: qty 100, 100d supply, fill #0

## 2022-04-09 MED ORDER — MELATONIN 3 MG PO TABS
3.0000 mg | ORAL_TABLET | Freq: Every day | ORAL | Status: DC | PRN
Start: 2022-04-09 — End: 2022-04-09
  Administered 2022-04-09: 3 mg via ORAL
  Filled 2022-04-09: qty 1

## 2022-04-09 MED ORDER — ACETAMINOPHEN 325 MG PO TABS
650.0000 mg | ORAL_TABLET | Freq: Four times a day (QID) | ORAL | Status: DC | PRN
Start: 2022-04-09 — End: 2022-04-09
  Administered 2022-04-09: 650 mg via ORAL
  Filled 2022-04-09: qty 2

## 2022-04-09 MED ORDER — ASPIRIN 81 MG PO CHEW
81.0000 mg | CHEWABLE_TABLET | Freq: Every day | ORAL | Status: DC
  Administered 2022-04-09: 81 mg via ORAL
  Filled 2022-04-09: qty 1

## 2022-04-09 MED ORDER — STROKE: EARLY STAGES OF RECOVERY BOOK
Status: AC
  Filled 2022-04-09: qty 1

## 2022-04-09 NOTE — Progress Notes (Signed)
STROKE TEAM PROGRESS NOTE   INTERVAL HISTORY Pt seen in Grace Ferrell room sitting up in a chair and wanting to go home..  Patient refuses to take anticoagulation despite being told that she has a clot in Grace Ferrell heart and is at very high risk for recurrent strokes.  She is absolutely adamant does not want to listen to me Vitals:   04/08/22 2025 04/09/22 0505 04/09/22 0751 04/09/22 1213  BP: (!) 142/67 (!) 151/61 (!) 142/66 (!) 152/68  Pulse: 76 79 73 76  Resp: 16 16 18 18   Temp: 97.8 F (36.6 C) 98.4 F (36.9 C) 97.8 F (36.6 C) 98.9 F (37.2 C)  TempSrc: Oral Oral Oral Oral  SpO2: 100% 96% 97% 100%  Weight:      Height:       CBC:  Recent Labs  Lab 04/08/22 0122 04/09/22 0112  WBC 7.3 7.0  HGB 10.5* 10.6*  HCT 31.8* 32.1*  MCV 75.7* 75.7*  PLT 223 206   Basic Metabolic Panel:  Recent Labs  Lab 04/07/22 1119 04/08/22 0616 04/08/22 1408 04/09/22 0112  NA  --  142 144 140  K  --  2.7* 4.7 3.9  CL  --  106 109 110  CO2  --  26 24 21*  GLUCOSE  --  157* 123* 155*  BUN  --  5* 5* 7  CREATININE  --  0.85 0.95 0.93  CALCIUM  --  9.1 9.1 9.1  MG 1.4* 1.7  --   --     Lipid Panel:  Recent Labs  Lab 04/08/22 0616  CHOL 117  TRIG 99  HDL 28*  CHOLHDL 4.2  VLDL 20  LDLCALC 69    HgbA1c: 7.1 on 02/12/22 Urine Drug Screen:    IMAGING past 24 hours No results found.  PHYSICAL EXAM Pleasant middle-age African-American lady not in distress. . Afebrile. Head is nontraumatic. Neck is supple without bruit.    Cardiac exam no murmur or gallop. Lungs are clear to auscultation. Distal pulses are well felt.   Neurological Examination Mental Status: Awake and alert. Oriented x 5. Speech fluent without evidence of aphasia. Naming and comprehension intact. Able to follow all commands without difficulty. Cranial Nerves: II: Temporal visual fields intact with no extinction to DSS. PERRL.   III,IV, VI: EOMI. No nystagmus. No ptosis V: Temp sensation equal bilaterally  VII: Smile  symmetric VIII: Hearing intact to voice IX,X: No hypophonia or hoarseness XI: Symmetric shoulder shrug XII: Midline tongue extension Motor: RUE 5/5 proximally and distally LUE 5/5 proximally and distally BLE 4+/5 in the context of poor effort.  No pronator drift Sensory: chronically Decreased temp sensation to left shoulder region, otherwise BUE temp sensation is equal.  Subjectively does not feel the coolness of a stimulus to legs bilaterally.  FT intact to BUE and BLE.  Deep Tendon Reflexes: 2+ right biceps and brachioradialis, 1+ left biceps and brachioradialis. 2+ right patellar, 1+ left patellar. Toes downgoing bilaterally.  Cerebellar: No ataxia with FNF bilaterally. Poor cooperation with testing of BLE.  Gait: Deferred    ASSESSMENT/PLAN [name] is a 56 y.o. female smoker with history of  type II DM,  HLD, and hypertension presenting with one month hisotry of vomiting and diarrhea. She presented to hospital for these symptoms. Patient remarks that she was heavily dehydrated and developed generalized weakness lower extremity weakness. She had been on aspirin but that was discontinued over one year ago. Limited brain MRI done showed bilateral cerebellar  acute infarcts, at inferomedial right cerebellum and posteroinferior left cerebellum.    Stroke :  bilateral punctate cerebellar infarcts   likely secondary   to embolism from intracardiac clot  code Stroke  CT head No acute abnormality.   Small vessel disease. ASPECTS 10.     CTA head & neck  1. Non opacification of the left vertebral artery from the distal V1 segment through the distal V4 segment, with some retrograde opacification at the vertebrobasilar junction, of indeterminate acuity. 2. No other hemodynamically significant stenosis in the neck. 3. Evaluation of the intracranial vasculature is somewhat limited by bolus timing. Within this limitation, no additional large vessel occlusion. Mild narrowing in the right  cavernous and bilateral supraclinoid ICAs and distal right V4. The bilateral PICAs are not visualized, which could be secondary to bolus timing.       MRI  brain: Evaluation is significantly limited by motion artifact and early termination of the exam at the patient's request. Within this limitation, there are acute infarcts in the inferomedial right cerebellum and posteroinferior left cerebellum.   2D Echo left ventricular apical clot.  Ejection fraction 25%   LDL 69 HgbA1c 7.1 VTE prophylaxis - scd     Diet   Diet heart healthy/carb modified Room service appropriate? Yes; Fluid consistency: Thin   No antithrombotic prior to admission, now on recommend anticoagulation with Eliquis or warfarin but patient is refusing Therapy recommendations:    SLP: no needs OT: no follow up PT: no follow up   Disposition:  home without therapy   Hypertension Home meds:  valsartan  Stable Permissive hypertension (OK if < 220/120) but gradually normalize in 5-7 days Long-term BP goal normotensive  Hyperlipidemia Home meds:  lipitor 40mg ,  resumed in hospital LDL 69, goal < 70 Add    High intensity statin not indicated   Continue statin at discharge  Diabetes type II Controlled Home meds:  metformin 1gm twice daily  HgbA1c 7.1, goal < 7.0 CBGs Recent Labs    04/08/22 2157 04/09/22 0849 04/09/22 1216  GLUCAP 184* 140* 177*    SSI  Other Stroke Risk Factors   Cigarette smoker advised to stop smoking    Obesity, Body mass index is 30.08 kg/m., BMI >/= 30 associated with increased stroke risk, recommend weight loss, diet and exercise as appropriate       Obstructive sleep apnea,  on CPAP at home  Congestive heart failure: chronic systolic heart failure:  Patient home medication includes Lasix 40 mg.  Denies shortness of breath or dyspnea with exertion.  Denies swelling in legs on admission.  Last echocardiogram in 2016 with EF of 55-60%, grade 1 diastolic dysfunction and mild  to moderate aortic valve regurgitation. -We will hold Lasix 40 mg at this time -Repeat echocardiogram, see problem for acute CVA    Hospital day # 1     Patient presented with episode of vomiting diarrhea and likely dehydration and some gait instability and MRI shows punctate bilateral cerebellar infarcts likely from small vessel disease.  2D echo shows left ventricular apical clot .Recommend anticoagulation with Eliquis or warfarin and aggressive risk factor modification.  Patient is quite adamant that she does not want anticoagulation of any sorts and does not believe in medications and wants to go home.  Greater than 50% time during this 35-minute visit was spent on counseling and coordination of care about Grace Ferrell strokes and discussion with care team and answering questions.  2017, MD Medical Director [facility] Pager: [phone] 04/09/2022 4:58 PM   To contact Stroke Continuity provider, please refer to [email or web address]. After hours, contact General Neurology

## 2022-04-09 NOTE — Progress Notes (Signed)
Discharged instructions given. Patient verbalized understanding and all questions were answered.  

## 2022-04-09 NOTE — Telephone Encounter (Signed)
Noted and agree. 

## 2022-04-09 NOTE — TOC CAGE-AID Note (Signed)
Transition of Care [facility]) - CAGE-AID Screening   Patient Details  Name: Grace Ferrell MRN: [id number] Date of Birth: 09-16-1966  [facility] of [facility] CM/SW Contact:    Coralee Pesa, [place] Phone Number: 04/09/2022, 12:11 PM   Clinical Narrative: CSW met with pt at bedside to complete CAGE- AID assessment. Pt w/ flat affect, does not open eyes, but agrees to participate. Pt answered no to all questions and declines resources at this time.   CAGE-AID Screening:    Have You Ever Felt You Ought to Cut Down on Your Drinking or Drug Use?: No Have People Annoyed You By Critizing Your Drinking Or Drug Use?: No Have You Felt Bad Or Guilty About Your Drinking Or Drug Use?: No Have You Ever Had a Drink or Used Drugs First Thing In The Morning to Steady Your Nerves or to Get Rid of a Hangover?: No CAGE-AID Score: 0  Substance Abuse Education Offered: Yes

## 2022-04-09 NOTE — Plan of Care (Signed)
  Problem: Education: Goal: Ability to describe self-care measures that may prevent or decrease complications (Diabetes Survival Skills Education) will improve Outcome: Adequate for Discharge Goal: Individualized Educational Video(s) Outcome: Adequate for Discharge   Problem: Coping: Goal: Ability to adjust to condition or change in health will improve Outcome: Adequate for Discharge   Problem: Fluid Volume: Goal: Ability to maintain a balanced intake and output will improve Outcome: Adequate for Discharge   Problem: Health Behavior/Discharge Planning: Goal: Ability to identify and utilize available resources and services will improve Outcome: Adequate for Discharge Goal: Ability to manage health-related needs will improve Outcome: Adequate for Discharge   Problem: Metabolic: Goal: Ability to maintain appropriate glucose levels will improve Outcome: Adequate for Discharge   Problem: Nutritional: Goal: Maintenance of adequate nutrition will improve Outcome: Adequate for Discharge Goal: Progress toward achieving an optimal weight will improve Outcome: Adequate for Discharge   Problem: Skin Integrity: Goal: Risk for impaired skin integrity will decrease Outcome: Adequate for Discharge   Problem: Tissue Perfusion: Goal: Adequacy of tissue perfusion will improve Outcome: Adequate for Discharge   Problem: Acute Rehab OT Goals (only OT should resolve) Goal: Pt. Will Perform Grooming Outcome: Adequate for Discharge Goal: OT Additional ADL Goal #1 Outcome: Adequate for Discharge Goal: OT Additional ADL Goal #2 Outcome: Adequate for Discharge   Problem: Education: Goal: Knowledge of General Education information will improve Description: Including pain rating scale, medication(s)/side effects and non-pharmacologic comfort measures Outcome: Adequate for Discharge   Problem: Health Behavior/Discharge Planning: Goal: Ability to manage health-related needs will improve Outcome:  Adequate for Discharge   Problem: Clinical Measurements: Goal: Ability to maintain clinical measurements within normal limits will improve Outcome: Adequate for Discharge Goal: Will remain free from infection Outcome: Adequate for Discharge Goal: Diagnostic test results will improve Outcome: Adequate for Discharge Goal: Respiratory complications will improve Outcome: Adequate for Discharge Goal: Cardiovascular complication will be avoided Outcome: Adequate for Discharge   Problem: Activity: Goal: Risk for activity intolerance will decrease Outcome: Adequate for Discharge   Problem: Nutrition: Goal: Adequate nutrition will be maintained Outcome: Adequate for Discharge   Problem: Coping: Goal: Level of anxiety will decrease Outcome: Adequate for Discharge   Problem: Elimination: Goal: Will not experience complications related to bowel motility Outcome: Adequate for Discharge Goal: Will not experience complications related to urinary retention Outcome: Adequate for Discharge   Problem: Pain Managment: Goal: General experience of comfort will improve Outcome: Adequate for Discharge   Problem: Safety: Goal: Ability to remain free from injury will improve Outcome: Adequate for Discharge   Problem: Skin Integrity: Goal: Risk for impaired skin integrity will decrease Outcome: Adequate for Discharge   Problem: Education: Goal: Knowledge of disease or condition will improve Outcome: Adequate for Discharge Goal: Knowledge of secondary prevention will improve (SELECT ALL) Outcome: Adequate for Discharge Goal: Knowledge of patient specific risk factors will improve (INDIVIDUALIZE FOR PATIENT) Outcome: Adequate for Discharge Goal: Individualized Educational Video(s) Outcome: Adequate for Discharge   Problem: Coping: Goal: Will verbalize positive feelings about self Outcome: Adequate for Discharge Goal: Will identify appropriate support needs Outcome: Adequate for  Discharge   Problem: Health Behavior/Discharge Planning: Goal: Ability to manage health-related needs will improve Outcome: Adequate for Discharge   Problem: Ischemic Stroke/TIA Tissue Perfusion: Goal: Complications of ischemic stroke/TIA will be minimized Outcome: Adequate for Discharge

## 2022-04-09 NOTE — TOC Transition Note (Signed)
Transition of Care [facility]) - CM/SW Discharge Note   Patient Details  Name: Grace Ferrell MRN: [id number] Date of Birth: [birth year]  [facility]) CM/SW Contact:  Kermit Balo, RN Phone Number: 04/09/2022, 9:50 AM   Clinical Narrative:    Pt discharging home with self care. States she lives with her sister and works for a [employer].  Sister does the driving or she uses [place].  She gets her medications at [facility] 3 months at a time. She denies any issues with her medications.  She states she is not going to take any new medications except ASA.  No f/u per PT/OT and no DME needs.  Pt states she will [place] home.    Final next level of care: Home/Self Care Barriers to Discharge: Inadequate or no insurance, Barriers Unresolved (comment)   Patient Goals and CMS Choice        Discharge Placement                       Discharge Plan and Services                                     Social Determinants of Health (SDOH) Interventions     Readmission Risk Interventions     No data to display

## 2022-04-09 NOTE — Progress Notes (Signed)
Pt would not take the medications delivered to room.

## 2022-04-09 NOTE — Discharge Summary (Signed)
Family Medicine [facility] Discharge Summary  Patient name: Grace Ferrell Medical record number: [id number] Date of birth: 1966/02/05 Age: 56 y.o. Gender: female Date of Admission: 04/07/2022  Date of Discharge: 04/09/2022 Admitting Physician: Doreene Eland, MD  Primary Care Provider: Maury Dus, MD Consultants: Neurology and cardiology  Indication for Hospitalization: Acute Cerebellar CVA  Discharge Diagnoses/Problem List:  Principal Problem:   Acute ischemic stroke [facility]) Active Problems:   DM2 (diabetes mellitus, type 2) (HCC)   TOBACCO USER   Essential hypertension   Chronic systolic heart failure (HCC)   Stroke (HCC)   CVA (cerebral vascular accident) (HCC)   Cerebellar stroke (HCC)  Disposition: Home  Discharge Condition: Stable  Discharge Exam:  General: 56 year old female, NAD Cardiovascular: RRR, normal S1/S2 Respiratory: CTAB, normal effort Abdomen: Bowel sounds present, soft, nontender to palpation, nondistended Neuro: Alert, no focal deficits Extremities: No edema of BLEs  Brief Hospital Course:  Grace Ferrell is a 56 y.o. female with PMHx of HFpEF, tobacco use, T2DM, and HTN presenting with dizziness in setting of 1 month of diarrhea and vomiting and found to have acute CVA.   Cerebellar Infarcts  Pt found to have bilateral cerebellar infarcts on brain MRI. CT head and neck imaging was notable for narrowing  of the right cavernous and bilateral supraclinoid ICAs and distal right V4. She was evaluated by neurology with initial recommendation for starting DAPT.  Ultimately the patient refused Plavix but agreed to continue aspirin and Lipitor.  Neuro exams were normal throughout hospitalization.  Left ventricular thrombus Echocardiogram on 04/08/2022 showed LV thrombus, likely cause of cerebellar infarcts.  Neurology recommended discontinuing DAPT and starting patient on blood thinner with consult to cardiology.  Cardiology saw patient and recommended  heparin drip with plans to transition to warfarin.  Patient adamantly refused anticoagulation treatment.  Cardiology noted that risks including death, heart attack, stroke or other embolic events were discussed at length and patient still declined treatment.  The same risks for subsequently discussed with family medicine and patient continued to refuse treatment.  Chronic systolic heart failure (HCC) Echocardiogram completed 04/08/22 and showed EF 25%, G2DD, LV thrombus, pulmonary hypertension, mild MR, moderate AR, moderate RV dysfunction. Home medication  Lasix 40 mg was held during admission but continued at discharge.  Cardiology was consulted who recommended further work-up considering last EF was 55% in 2016.  Patient adamantly declined further work-up with cardiology in the hospital.    Essential hypertension Home Coreg was held to allow for permissive hypertension and was restarted upon discharge.  Hypokalemia Potassium of 2.7 was appropriately repleted.  Mag was 1.7 and repleted. Patient was discharged with potassium 20 mEq daily for 8 days and following up with PCP to have BMP.  T2DM Patient had A1c upon admission of 6.6.  She was treated with sensitive SSI while hospitalized.  Home metformin and glimepiride were continued upon discharge.  Issues for Follow up with PCP  CTA of the head and neck showed hypoenhancing lesions in the thyroid with largest measuring 2.2 cm.  These were present on ultrasound from 2011. Consider thyroid ultrasound outpatient Repeat BMP to check potassium Ensure follow-up with outpatient cardiologist Consider further work up for patient's microcytic anemia   Significant Procedures: None  Significant Labs and Imaging:  Recent Labs  Lab 04/07/22 0847 04/08/22 0122 04/09/22 0112  WBC 7.4 7.3 7.0  HGB 11.1* 10.5* 10.6*  HCT 33.8* 31.8* 32.1*  PLT 225 223 206   Recent Labs  Lab 04/07/22  6962 04/07/22 1119 04/08/22 0616 04/08/22 1408 04/09/22 0112   NA 142  --  142 144 140  K 3.4*  --  2.7* 4.7 3.9  CL 103  --  106 109 110  CO2 26  --  26 24 21*  GLUCOSE 224*  --  157* 123* 155*  BUN 7  --  5* 5* 7  CREATININE 0.89  --  0.85 0.95 0.93  CALCIUM 9.7  --  9.1 9.1 9.1  MG  --  1.4* 1.7  --   --   ALKPHOS 65  --  63  --   --   AST 23  --  18  --   --   ALT 9  --  9  --   --   ALBUMIN 3.6  --  3.3*  --   --      Results/Tests Pending at Time of Discharge: none  Discharge Medications:  Allergies as of 04/09/2022       Reactions   Sulfamethoxazole-trimethoprim Shortness Of Breath, Swelling   Airway involvement noted   Tessalon Perles [benzonatate] Rash   Codeine Itching   With excoriations   Darvocet [propoxyphene N-acetaminophen] Nausea And Vomiting   Enalapril Cough   Dry cough   Morphine And Related Itching   With excoriations   Vicodin [hydrocodone-acetaminophen] Nausea Only, Cough        Medication List     TAKE these medications    aspirin EC 325 MG tablet Take 1 tablet (325 mg total) by mouth daily.   atorvastatin 40 MG tablet Commonly known as: LIPITOR Take 1 tablet (40 mg total) by mouth daily.   carvedilol 6.25 MG tablet Commonly known as: COREG Take 1 tablet (6.25 mg total) by mouth 2 (two) times daily with a meal.   cetirizine 10 MG tablet Commonly known as: ZYRTEC Take 1 tablet (10 mg total) by mouth daily.   famotidine 20 MG tablet Commonly known as: PEPCID Take 1 tablet (20 mg total) by mouth 2 (two) times daily.   fluticasone 50 MCG/ACT nasal spray Commonly known as: FLONASE Place 2 sprays into both nostrils daily. What changed:  how much to take when to take this   furosemide 40 MG tablet Commonly known as: LASIX Take 1 tablet (40 mg total) by mouth daily.   glimepiride 4 MG tablet Commonly known as: AMARYL TAKE TWO TABLETS BY MOUTH EVERY MORNING WITH BREAKFAST What changed: See the new instructions.   metFORMIN 1000 MG tablet Commonly known as: GLUCOPHAGE TAKE ONE TABLET  BY MOUTH TWICE A DAY WITH MEALS   potassium chloride SA 20 MEQ tablet Commonly known as: KLOR-CON M Take 1 tablet (20 mEq total) by mouth daily.   Trelegy Ellipta 200-62.5-25 MCG/ACT Aepb Generic drug: Fluticasone-Umeclidin-Vilant Inhale 1 puff into the lungs daily.   valsartan 40 MG tablet Commonly known as: DIOVAN TAKE ONE TABLET BY MOUTH DAILY   Womens Multivitamin Tabs Take 1 tablet by mouth daily.        Discharge Instructions: Please refer to Patient Instructions section of EMR for full details.  Patient was counseled important signs and symptoms that should prompt return to medical care, changes in medications, dietary instructions, activity restrictions, and follow up appointments.   Follow-Up Appointments:  Follow-up Information     Maury Dus, MD. Go on 04/15/2022.   Specialty: Family Medicine Why: Appointment time is 8:50 am Contact information: [address] [place] [address] [phone]  Erick Alley, DO 04/09/2022, 2:09 PM PGY-1, [facility]

## 2022-04-09 NOTE — Plan of Care (Signed)
Pt is ready for discharge. Problem: Education: Goal: Ability to describe self-care measures that may prevent or decrease complications (Diabetes Survival Skills Education) will improve Outcome: Adequate for Discharge Goal: Individualized Educational Video(s) Outcome: Adequate for Discharge   Problem: Coping: Goal: Ability to adjust to condition or change in health will improve Outcome: Adequate for Discharge   Problem: Fluid Volume: Goal: Ability to maintain a balanced intake and output will improve Outcome: Adequate for Discharge   Problem: Health Behavior/Discharge Planning: Goal: Ability to identify and utilize available resources and services will improve Outcome: Adequate for Discharge Goal: Ability to manage health-related needs will improve Outcome: Adequate for Discharge   Problem: Metabolic: Goal: Ability to maintain appropriate glucose levels will improve Outcome: Adequate for Discharge   Problem: Nutritional: Goal: Maintenance of adequate nutrition will improve Outcome: Adequate for Discharge Goal: Progress toward achieving an optimal weight will improve Outcome: Adequate for Discharge   Problem: Skin Integrity: Goal: Risk for impaired skin integrity will decrease Outcome: Adequate for Discharge   Problem: Tissue Perfusion: Goal: Adequacy of tissue perfusion will improve Outcome: Adequate for Discharge   Problem: Acute Rehab OT Goals (only OT should resolve) Goal: Pt. Will Perform Grooming Outcome: Adequate for Discharge Goal: OT Additional ADL Goal #1 Outcome: Adequate for Discharge Goal: OT Additional ADL Goal #2 Outcome: Adequate for Discharge   Problem: Education: Goal: Knowledge of General Education information will improve Description: Including pain rating scale, medication(s)/side effects and non-pharmacologic comfort measures Outcome: Adequate for Discharge   Problem: Health Behavior/Discharge Planning: Goal: Ability to manage health-related  needs will improve Outcome: Adequate for Discharge   Problem: Clinical Measurements: Goal: Ability to maintain clinical measurements within normal limits will improve Outcome: Adequate for Discharge Goal: Will remain free from infection Outcome: Adequate for Discharge Goal: Diagnostic test results will improve Outcome: Adequate for Discharge Goal: Respiratory complications will improve Outcome: Adequate for Discharge Goal: Cardiovascular complication will be avoided Outcome: Adequate for Discharge   Problem: Activity: Goal: Risk for activity intolerance will decrease Outcome: Adequate for Discharge   Problem: Nutrition: Goal: Adequate nutrition will be maintained Outcome: Adequate for Discharge   Problem: Coping: Goal: Level of anxiety will decrease Outcome: Adequate for Discharge   Problem: Elimination: Goal: Will not experience complications related to bowel motility Outcome: Adequate for Discharge Goal: Will not experience complications related to urinary retention Outcome: Adequate for Discharge   Problem: Pain Managment: Goal: General experience of comfort will improve Outcome: Adequate for Discharge   Problem: Safety: Goal: Ability to remain free from injury will improve Outcome: Adequate for Discharge   Problem: Skin Integrity: Goal: Risk for impaired skin integrity will decrease Outcome: Adequate for Discharge

## 2022-04-09 NOTE — Telephone Encounter (Signed)
Patient called to share that she was in the hospital for a stroke.    She reports her potassium was low and she believed that the doctors thought this was a cause of her stroke.    She verbalized frustration, was simply calling to make me aware.  She thanked me for listening and supporting her.  (She continues to abstain from smoking).   I look forward to seeing her in follow-up.

## 2022-04-09 NOTE — Progress Notes (Signed)
HPI: The patient presented with dizziness and ataxia, which have since resolved.  Exam: There were no acute findings on her physical exam. Neuro exam grossly intact.  Imaging MRI brain indicates a new stroke. The stroke workup completed included PT eval and ECHO. Her ECHO shows an EF of 25%, and GIIDD with an apical LV thrombus.   A/P: Acute stroke We appreciate neurology's recommendation. The transition from DAPT to DOAC - However, she declined DOAC or Plavix. She takes Aspirin at home, and she is amenable to taking ASA alone. For better antiplatelet coverage, we started ASA 325 mg QD PT/O recommended no outpatient PT.  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 However, she is not open to the addition of new medication. She wishes to f/u with her PCP for an outpatient cardiology referral. D/C home today.

## 2022-04-09 NOTE — Plan of Care (Signed)
Patient refused heparin drip, explained the relevance of the medication and still refused and stated " I don't feel like taking that medicine"

## 2022-04-09 NOTE — Discharge Instructions (Signed)
Thank you for allowing [place] to care for you during your stay.  You were admitted to the family medicine teaching service due to having a stroke.  When looking for the cause of your stroke, you were found to have a blood clot in your heart.  You were also found to have severely decreased function of the left ventricle of your heart which pumps blood to the rest of your body.  As you are not willing to start a blood thinner to prevent another stroke, we recommend you follow-up with a cardiologist outpatient as soon as possible.  We recommend you continue all of your medications and add aspirin daily and a potassium supplement daily until you follow-up with your primary care doctor to have your potassium level checked. You have an appointment to follow-up with the family medicine team at [facility] on June 26 at 8:50 AM and an appointment the same day with Dr. Raymondo Band at 9:45 AM.  Reasons to return to the hospital or seek medical care include increased swelling in your extremities, swelling of your abdomen, shortness of breath, dizziness/lightheadedness, inability to speak/slurred speech, facial droop, vision changes, severe headache, and weakness in any part of your body.

## 2022-04-10 ENCOUNTER — Other Ambulatory Visit (HOSPITAL_COMMUNITY): Payer: Self-pay

## 2022-04-10 ENCOUNTER — Telehealth: Payer: Self-pay

## 2022-04-10 NOTE — Telephone Encounter (Signed)
Patient calls nurse line requesting a letter for employer stating inpatient stay dates.   Once letter is written patient would like this faxed to [facility] [phone].  Will forward to PCP.

## 2022-04-10 NOTE — Telephone Encounter (Signed)
Letter has been completed and placed in RN triage bin in front office. Please fax per her request.  Maury Dus, MD

## 2022-04-11 ENCOUNTER — Telehealth: Payer: Self-pay | Admitting: Family Medicine

## 2022-04-11 NOTE — Telephone Encounter (Signed)
After hours line  Patient recently discharged a few days ago for acute stroke. Reports that she has not been feeling worse, she continues to have a headache that somewhat improves with tylenol. Still having some intermittent balance issues. Denies vomiting and vision changes. Also denies any new symptoms. Denies falling or loss of consciousness. I explained to her that at the time she declined further work up after noting to have an acute cerebellar stroke which is affecting her balance as well as a new thrombus found on echo. She says she feels more pressure in her face, denies a fever. Reports cough with minimal congestion. Recommended supportive care. Instructed to maintain adequate hydration. Instructed to return to the ED if she has any worsening symptoms or new emesis or vision changes. She is not able to check her blood pressure at home as she does not have a cuff. Stroke precautions discussed as well as other strict return precautions provided. She has an appointment on Monday and would like to wait to be seen until then. I encouraged her to consider anticoagulation and discuss this with her provider at the next visit, she agreed and says she will consider this. Patient demonstrates appropriate understanding and is in agreement with plan.

## 2022-04-11 NOTE — Telephone Encounter (Signed)
Called patient. As patient has not signed release of medical information to employer, we are unable to fax this letter. Patient has a follow up appointment on Monday and states that she will need to discuss extending her time off.   I will place this original letter at front desk for patient to pick up at this appointment.   Veronda Prude, RN

## 2022-04-14 ENCOUNTER — Other Ambulatory Visit: Payer: Self-pay | Admitting: Family Medicine

## 2022-04-14 NOTE — Progress Notes (Signed)
    SUBJECTIVE:   CHIEF COMPLAINT / HPI: hospital follow up and sinus problems    Patient was recommended to start ASA and Plavix, she declined and only wanted to take ASA Today she reports that she does not want to start anticoagulation as she would like to speak with cardiology first  Patient states that she has concerns for the combination of medications and risks of side effects    She was not recommended for follow up with PT while hospitalized  She reports continued unsteady gait, adding that she has been using a family member's walker to help with balance  She denies continued HA and reports feeling overall improved from the time she had the stroke    Sinus issues  She reports having sinus pressure, nasal congestion intermittent with runny nose  She reports frontal HA as well  She is not sure if she is having fevers or chills  She reports feeling unwell and thinks it is her sinuses because it has been present for close to 2 weeks   PERTINENT  PMH / PSH:  Cerebellar stroke  HTN  Tobacco Use  T2DM    OBJECTIVE:   BP 124/70   Pulse 65   Temp 98.3 F (36.8 C)   Ht 5\' 2"  (1.575 m)   Wt 161 lb (73 kg)   SpO2 97%   BMI 29.45 kg/m   Physical Exam HENT:     Right Ear: Tympanic membrane normal.     Left Ear: Tympanic membrane normal.     Nose:     Right Nostril: No foreign body.     Left Nostril: No foreign body.     Right Turbinates: Enlarged.     Left Turbinates: Enlarged.     Right Sinus: Maxillary sinus tenderness and frontal sinus tenderness present.     Left Sinus: Maxillary sinus tenderness and frontal sinus tenderness present.     Mouth/Throat:     Mouth: Mucous membranes are moist. No oral lesions.  Neurological:     Mental Status: She is alert and oriented to person, place, and time.     Cranial Nerves: Cranial nerves 2-12 are intact. No dysarthria.     Sensory: Sensation is intact.     Motor: Pronator drift present. No weakness or abnormal muscle tone.      Coordination: Coordination normal. Finger-Nose-Finger Test and Heel to Shin Test normal.     Gait: Gait abnormal.     Comments: Patient unsteady on feet, using wheelchair for mobilization through the clinic, unable to perform pronator drift due to unsteadiness with attempt to ambulate around the room      ASSESSMENT/PLAN:   Cerebellar stroke (HCC) Continued unsteady gait observed during visit  Unable to complete pronator drift test Recommending outpatient PT, referral placed today  Also referring to cardiology for further evaluation including discussing anticoagulation for noted left apical thrombus on echo   Acute non-recurrent pansinusitis Will treat with Augmentin Bid for 10 day course    F/u in 1 week   , MD [facility]

## 2022-04-15 ENCOUNTER — Encounter: Payer: Self-pay | Admitting: Family Medicine

## 2022-04-15 ENCOUNTER — Ambulatory Visit (INDEPENDENT_AMBULATORY_CARE_PROVIDER_SITE_OTHER): Payer: Self-pay | Admitting: Family Medicine

## 2022-04-15 ENCOUNTER — Ambulatory Visit (INDEPENDENT_AMBULATORY_CARE_PROVIDER_SITE_OTHER): Payer: Self-pay | Admitting: Pharmacist

## 2022-04-15 ENCOUNTER — Encounter: Payer: Self-pay | Admitting: Pharmacist

## 2022-04-15 VITALS — BP 124/70 | HR 65 | Temp 98.3°F | Ht 62.0 in | Wt 161.0 lb

## 2022-04-15 DIAGNOSIS — I69398 Other sequelae of cerebral infarction: Secondary | ICD-10-CM

## 2022-04-15 DIAGNOSIS — F17200 Nicotine dependence, unspecified, uncomplicated: Secondary | ICD-10-CM

## 2022-04-15 DIAGNOSIS — I6930 Unspecified sequelae of cerebral infarction: Secondary | ICD-10-CM

## 2022-04-15 DIAGNOSIS — E1169 Type 2 diabetes mellitus with other specified complication: Secondary | ICD-10-CM

## 2022-04-15 DIAGNOSIS — J3489 Other specified disorders of nose and nasal sinuses: Secondary | ICD-10-CM

## 2022-04-15 DIAGNOSIS — J0140 Acute pansinusitis, unspecified: Secondary | ICD-10-CM

## 2022-04-15 DIAGNOSIS — R269 Unspecified abnormalities of gait and mobility: Secondary | ICD-10-CM

## 2022-04-15 DIAGNOSIS — E876 Hypokalemia: Secondary | ICD-10-CM

## 2022-04-15 DIAGNOSIS — R058 Other specified cough: Secondary | ICD-10-CM

## 2022-04-15 DIAGNOSIS — E782 Mixed hyperlipidemia: Secondary | ICD-10-CM

## 2022-04-15 DIAGNOSIS — I639 Cerebral infarction, unspecified: Secondary | ICD-10-CM

## 2022-04-15 MED ORDER — AMOXICILLIN-POT CLAVULANATE 875-125 MG PO TABS: 1.0000 | ORAL_TABLET | Freq: Two times a day (BID) | ORAL | 0 refills | Status: AC

## 2022-04-15 NOTE — Assessment & Plan Note (Signed)
Tobacco use disorder with history or moderate nicotine dependence who has recently quit for several days prior to hospitalization.  She has slipped back to smoking 3-5 cigarettes per day.  She remains a good  candidate for long-term success because of motivation to quit completely.    - Encouraged complete abstinence and stressed importance of quitting again soon.  Congratulated her on her success thus far with her attempt at quitting.

## 2022-04-15 NOTE — Progress Notes (Signed)
   S:   Chief Complaint  Patient presents with   Medication Management    Tobacco Abuse - Post hospitalization   Grace Ferrell is a 56 y.o. female who presents for evaluation/assistance with tobacco dependence.  PMH is significant for recent hospitalization for stroke.  She is here in the office today for hospital follow-up with Dr. Neita Garnet.  At last visit/contact, patient reports total abstinence from tobacco.  Her recent hospitalization lead to a relapse in smoking with intake of 3-5 cigs per day.    Rates IMPORTANCE of quitting tobacco as high. Rates CONFIDENCE of quitting tobacco as high.  Most common triggers to use tobacco include; stress.   Motivation to quit: health, breathing  O: Clinical ASCVD: Yes   Review of Systems  HENT:  Positive for congestion.   Respiratory:  Positive for cough and sputum production.   Patient expresses concern for COVID infection.  Deferred to Dr. Neita Garnet.    A/P: Tobacco use disorder with history or moderate nicotine dependence who has recently quit for several days prior to hospitalization.  She has slipped back to smoking 3-5 cigarettes per day.  She remains a good  candidate for long-term success because of motivation to quit completely.    - Encouraged complete abstinence and stressed importance of quitting again soon.  Congratulated her on her success thus far with her attempt at quitting.   Patient verbalized understanding of treatment plan.  Total time in face to face counseling 12 minutes.    Follow-up:  Pharmacist by phone in 1 week.Marland Kitchen PCP clinic visit PRN.  Patient seen with Jarome Matin, PharmD Candidate.   Marland Kitchen

## 2022-04-16 LAB — BASIC METABOLIC PANEL
BUN/Creatinine Ratio: 12 (ref 9–23)
BUN: 11 mg/dL (ref 6–24)
CO2: 18 mmol/L — ABNORMAL LOW (ref 20–29)
Calcium: 9.6 mg/dL (ref 8.7–10.2)
Chloride: 108 mmol/L — ABNORMAL HIGH (ref 96–106)
Creatinine, Ser: 0.92 mg/dL (ref 0.57–1.00)
Glucose: 201 mg/dL — ABNORMAL HIGH (ref 70–99)
Potassium: 4.7 mmol/L (ref 3.5–5.2)
Sodium: 141 mmol/L (ref 134–144)
eGFR: 72 mL/min/{1.73_m2} (ref >59)

## 2022-04-16 LAB — NOVEL CORONAVIRUS, NAA: SARS-CoV-2, NAA: NOT DETECTED

## 2022-04-18 ENCOUNTER — Ambulatory Visit: Payer: No Typology Code available for payment source | Attending: Family Medicine | Admitting: Physical Therapy

## 2022-04-18 VITALS — BP 160/77 | HR 62

## 2022-04-18 DIAGNOSIS — R269 Unspecified abnormalities of gait and mobility: Secondary | ICD-10-CM | POA: Insufficient documentation

## 2022-04-18 DIAGNOSIS — Z9181 History of falling: Secondary | ICD-10-CM | POA: Insufficient documentation

## 2022-04-18 DIAGNOSIS — I69398 Other sequelae of cerebral infarction: Secondary | ICD-10-CM | POA: Insufficient documentation

## 2022-04-18 DIAGNOSIS — I639 Cerebral infarction, unspecified: Secondary | ICD-10-CM | POA: Insufficient documentation

## 2022-04-18 DIAGNOSIS — R2681 Unsteadiness on feet: Secondary | ICD-10-CM | POA: Insufficient documentation

## 2022-04-18 DIAGNOSIS — R2689 Other abnormalities of gait and mobility: Secondary | ICD-10-CM | POA: Insufficient documentation

## 2022-04-18 NOTE — Therapy (Signed)
OUTPATIENT PHYSICAL THERAPY NEURO EVALUATION   Patient Name: Grace Ferrell MRN: [id number] DOB:05/04/1966, 56 y.o., female Today's Date: 04/18/2022   PCP: Maury Dus, MD REFERRING PROVIDER: Westley Chandler, MD    PT End of Session - 04/18/22 1437     Visit Number 1    Number of Visits 5   Plus eval   Date for PT Re-Evaluation 05/23/22    Authorization Type Medicaid Potential - self pay    PT Start Time 1435    PT Stop Time 1530    PT Time Calculation (min) 55 min    Activity Tolerance Patient tolerated treatment well    Behavior During Therapy [facility] for tasks assessed/performed;Anxious             Past Medical History:  Diagnosis Date   Diabetes mellitus    Hypercholesteremia    Hypertension    Past Surgical History:  Procedure Laterality Date   ENDOMETRIAL ABLATION     KNEE SURGERY     TUBAL LIGATION     Patient Active Problem List   Diagnosis Date Noted   Cerebellar stroke (HCC)    Hypomagnesemia    Acute ischemic stroke (HCC) 04/07/2022   Chronic systolic heart failure (HCC) 10/26/2015   Moderate mitral regurgitation by prior echocardiogram 10/21/2014   Moderate aortic regurgitation 10/21/2014   Dyspnea    Essential hypertension    Mixed hyperlipidemia due to type 2 diabetes mellitus (HCC) 03/12/2012   TOBACCO USER 07/29/2009   DM2 (diabetes mellitus, type 2) (HCC) 12/18/2006    ONSET DATE: 04/15/2022 (referral)  REFERRING DIAG: I63.9 (ICD-10-CM) - Cerebrovascular accident (CVA) involving cerebellum (HCC) I69.398,R26.9 (ICD-10-CM) - Gait disturbance, post-stroke   THERAPY DIAG:  Unsteadiness on feet  History of falling  Other abnormalities of gait and mobility  Rationale for Evaluation and Treatment Rehabilitation  SUBJECTIVE:                                                                                                                                                                                              SUBJECTIVE STATEMENT: Pt  presents to OPPT following cerebellar CVA on 04/07/22. "I am a clumsy person". Pt reports prior to her CVA, she fell hard onto the pavement while turning too quickly. Did not hit her head. Has had no falls since returning home from the hospital, but used to fall "all the time" prior to CVA. Feels as though her balance is off but is able to perform basic ADLs at home. Has difficulty w/cleaning, cooking and bending over. Reports feeling dizzy w/vertical head movements, cannot remember if she was dizzy prior  to the CVA. Main concern is returning to work. Pt states she knows she needs to prioritize PT and work on herself, but her income is priority right now.   Pt accompanied by: self  PERTINENT HISTORY: Pt presented to ED on 6/18 with dizziness, vomiting and diarrhea. MRI showed acute left and right cerebellar infarcts.   PAIN:  Are you having pain? Yes: NPRS scale: 3/10 Pain location: Head  Pain description: Achy  Aggravating factors: Stress  Relieving factors: Sleeping, pressing palm of hand into forehead   PRECAUTIONS: Fall  WEIGHT BEARING RESTRICTIONS No  FALLS: Has patient fallen in last 6 months? Yes. Number of falls Pt reports she used to fall frequently prior to CVA, most recent one was 2 weeks ago   LIVING ENVIRONMENT: Lives with: lives with their family Lives in: House/apartment Stairs: No Has following equipment at home: Single point cane, Environmental consultant - 2 wheeled, and Grab bars  PLOF: Independent  PATIENT GOALS "I just want to get better, to go back to work and be a functional adult"   Vitals:   04/18/22 1512  BP: (!) 160/77  Pulse: 62     OBJECTIVE:   DIAGNOSTIC FINDINGS: MRI showed acute left and right cerebellar infarcts.   COGNITION: Overall cognitive status: Difficulty to assess due to: no family present   SENSATION: Pt reports bilateral feet are tingly and feel cold. Denies neuropathy   COORDINATION: Heel to shin test: WNL bilaterally  Finger to nose test: WNL  of RUE, mild dysmetria on LUE   POSTURE: rounded shoulders, forward head, and posterior pelvic tilt  LOWER EXTREMITY MMT:  tested in seated position   MMT Right Eval Left Eval  Hip flexion 4+ 4-  Hip extension    Hip abduction 5 5  Hip adduction 5 5  Hip internal rotation    Hip external rotation    Knee flexion 5 5  Knee extension 5 5  Ankle dorsiflexion 5 5  Ankle plantarflexion    Ankle inversion    Ankle eversion    (Blank rows = not tested)  BED MOBILITY:  Independent per pt   TRANSFERS: Assistive device utilized: None  Sit to stand: SBA pt very hesitant to perform due to being scared of falling  Stand to sit: SBA  GAIT: Gait pattern:  lateral gait deviations, step through pattern, decreased arm swing- Right, decreased arm swing- Left, scissoring, and ataxic Distance walked: Various clinic distances  Assistive device utilized: None Level of assistance: Min A Comments: Pt walks very guarded and w/rigid sagittal plane motion. Pt demonstrated ataxia of RLE > LLE and scissoring of gait throughout. Pt lost balance posterolaterally to R side w/turn, requiring min A to prevent fall  FUNCTIONAL TESTs:   [facility] PT Assessment - 04/18/22 1510       Transfers   Five time sit to stand comments  17.20s without UE support      Ambulation/Gait   Gait velocity 32.8' over 14.24s= 2.3 ft/s without AD, CGA for safety              PATIENT SURVEYS:  FOTO Not obtained by front desk staff   TODAY'S TREATMENT:  See above   PATIENT EDUCATION: Education details: Eval findings, purpose of PT, encouraged use of RW at home for stability and obtaining BP machine to monitor BP at home (elevated today in clinic). Plan on scheduling for 1x/wk for 4 weeks and allowing pt time to determine if she wants to participate in PT, as  pt unsure what she wants to do during evaluation. Informed pt to call to cancel appointments if she decides not to pursue PT.  Person educated:  Patient Education method: Medical illustrator Education comprehension: verbalized understanding and needs further education   HOME EXERCISE PROGRAM: To be established next session     GOALS: Goals reviewed with patient? Yes  SHORT TERM GOALS= LONG TERM GOALS DUE TO POC LENGTH: Target date: 05/16/2022  Pt will be independent with final HEP for improved strength, balance, transfers and gait.  Baseline: not established on eval  Goal status: INITIAL  2.  Berg to be assessed and goal written  Baseline:  Goal status: INITIAL  3.  Pt will improve 5 x STS to less than or equal to 12 seconds without UE support to demonstrate improved functional strength and transfer efficiency.   Baseline: 17.2s without UE support  Goal status: INITIAL  4.  Pt will improve gait velocity to at least 3.0 ft/s w/LRAD mod I for improved gait efficiency   Baseline: 2.3 ft/s without AD, min A for safety  Goal status: INITIAL  5.  TUG to be assessed and goal written  Baseline:  Goal status: INITIAL   ASSESSMENT:  CLINICAL IMPRESSION: Patient is a 56 year old female referred to Neuro OPPT for CVA. Pt's PMH is significant for: DM, HTN and CHF. The following deficits were present during the exam: decreased hip strength, decreased balance, decreased coordination, impaired safety awareness and dizziness. Based on 5x STS, gait speed and significant fall history, pt is an incr risk for falls. Balance to be further assessed next session. Pt would benefit from skilled PT to address these impairments and functional limitations to maximize functional mobility independence   OBJECTIVE IMPAIRMENTS Abnormal gait, decreased activity tolerance, decreased balance, decreased coordination, decreased endurance, decreased knowledge of condition, decreased knowledge of use of DME, decreased mobility, difficulty walking, decreased strength, decreased safety awareness, dizziness, impaired sensation, and pain.    ACTIVITY LIMITATIONS carrying, lifting, bending, squatting, stairs, transfers, and locomotion level  PARTICIPATION LIMITATIONS: meal prep, cleaning, laundry, personal finances, interpersonal relationship, community activity, occupation, and yard work  PERSONAL FACTORS Behavior pattern, Fitness, Past/current experiences, Transportation, and 1 comorbidity: HTN  are also affecting patient's functional outcome.   REHAB POTENTIAL: Good  CLINICAL DECISION MAKING: Evolving/moderate complexity  EVALUATION COMPLEXITY: Moderate  PLAN: PT FREQUENCY: 1x/week  PT DURATION: 4 weeks  PLANNED INTERVENTIONS: Therapeutic exercises, Therapeutic activity, Neuromuscular re-education, Balance training, Gait training, Patient/Family education, Stair training, DME instructions, and Re-evaluation  PLAN FOR NEXT SESSION: Berg, TUG, initial HEP for vestibular function, BLE strength and coordination. Vestibular assessment    Jill Alexanders Hollye Pritt, PT, DPT 04/18/2022, 4:42 PM

## 2022-04-20 ENCOUNTER — Telehealth: Payer: Self-pay | Admitting: Family Medicine

## 2022-04-20 NOTE — Telephone Encounter (Signed)
**  After Hours/ Emergency Line Call**  Received after-hours emergency line call from [facility] [place]. Patient reports ongoing headache in the back of her head that has been present since she was hospitalized 2 weeks ago.   It is unchanged from prior.  She started Augmentin 4 days ago but this has not helped. She is also taking Aleve 1 tablet daily.  No neuro changes-- no weakness or numbness anywhere, no vision changes, no speech changes. States she's actually walking better than before. No vomiting.  Encouraged scheduled Tylenol/Ibuprofen use over the next 24 hours to help with headache. Also advised proper hydration/nutrition and adequate sleep. Reviewed results of recent CTA head and neuro notes- continue to recommend anticoagulation but patient declines. She would benefit from outpatient neuro follow up.  No red flags on history to necessitate ED visit but ED/return call precautions were discussed in detail. Patient has [facility] appointment on 7/3.   Maury Dus, MD PGY-3, [facility] 04/20/2022 9:10 PM

## 2022-04-21 DIAGNOSIS — I6930 Unspecified sequelae of cerebral infarction: Secondary | ICD-10-CM | POA: Insufficient documentation

## 2022-04-21 NOTE — Assessment & Plan Note (Signed)
Will treat with Augmentin Bid for 10 day course

## 2022-04-21 NOTE — Progress Notes (Signed)
    SUBJECTIVE:   CHIEF COMPLAINT / HPI: f/u after stroke  Patient presents for follow up after referral to cardiology regarding left heart thrombus and to physical therapy for continued unsteady gait  Today she reports that she is improved in regards to her gait  She states she has been able to stand using the grab bars at home  She denies symptoms of vertigo She was able to walk into her appt today    She states that she saw a rehab provider on Thursday and thinks the session went well  She has been able to ambulate independently at home  Reports she has not smoked in 2 days   Patient reports she would like to return to work because she has no benefits and would not be able to return to PT due to costs  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  HENT:     Right Ear: External ear normal.     Left Ear: External ear normal.     Nose: Nose normal.     Mouth/Throat:     Pharynx: Oropharynx is clear.  Cardiovascular:     Rate and Rhythm: Normal rate and regular rhythm.  Pulmonary:     Effort: Pulmonary effort is normal.  Abdominal:     General: Bowel sounds are normal.     Palpations: Abdomen is soft.  Musculoskeletal:     Cervical back: Normal range of motion.  Skin:    General: Skin is warm.  Neurological:     General: No focal deficit present.     Mental Status: She is alert and oriented to person, place, and time.     Cranial Nerves: Cranial nerves 2-12 are intact.     Sensory: Sensation is intact.     Motor: Motor function is intact. No weakness, abnormal muscle tone or pronator drift.     Coordination: Coordination normal. Finger-Nose-Finger Test and Heel to  [place] Test normal.     Gait: Tandem walk normal.     Comments: Gait is improved Patient still has some unsteadiness with independent ambulation for >6 feet, slower pace than is normal for her  Normal heel-to-toe walking         ASSESSMENT/PLAN:   Cerebellar stroke [facility]) Will write letter for patient to return to work 04/24/2022 with recommended use of walker and taking 15 min breaks every 2 hours   Patient recommended to continue with PT if possible as gait still has some unsteadiness   Will have patient follow up in 3-4 weeks   Awaiting follow up with cardiology for discussion regarding anticoagulation      06/25/2022, MD [facility]

## 2022-04-21 NOTE — Assessment & Plan Note (Signed)
Continued unsteady gait observed during visit  Unable to complete pronator drift test Recommending outpatient PT, referral placed today  Also referring to cardiology for further evaluation including discussing anticoagulation for noted left apical thrombus on echo

## 2022-04-22 ENCOUNTER — Telehealth: Payer: Self-pay

## 2022-04-22 ENCOUNTER — Ambulatory Visit (INDEPENDENT_AMBULATORY_CARE_PROVIDER_SITE_OTHER): Payer: Self-pay | Admitting: Family Medicine

## 2022-04-22 ENCOUNTER — Encounter: Payer: Self-pay | Admitting: Family Medicine

## 2022-04-22 VITALS — BP 147/62 | HR 69 | Ht 62.0 in | Wt 164.4 lb

## 2022-04-22 DIAGNOSIS — I639 Cerebral infarction, unspecified: Secondary | ICD-10-CM

## 2022-04-22 DIAGNOSIS — I5022 Chronic systolic (congestive) heart failure: Secondary | ICD-10-CM

## 2022-04-22 DIAGNOSIS — I10 Essential (primary) hypertension: Secondary | ICD-10-CM

## 2022-04-22 MED ORDER — VALSARTAN 80 MG PO TABS: 80.0000 mg | ORAL_TABLET | Freq: Every day | ORAL | 0 refills | Status: DC

## 2022-04-22 NOTE — Assessment & Plan Note (Signed)
Will write letter for patient to return to work 04/24/2022 with recommended use of walker and taking 15 min breaks every 2 hours   Patient recommended to continue with PT if possible as gait still has some unsteadiness   Will have patient follow up in 3-4 weeks   Awaiting follow up with cardiology for discussion regarding anticoagulation

## 2022-04-22 NOTE — Telephone Encounter (Signed)
Confirmation received from [employer].  

## 2022-04-22 NOTE — Telephone Encounter (Signed)
Community message sent to [employer] for processing.   Will await confirmation.  

## 2022-04-22 NOTE — Patient Instructions (Addendum)
I will write for you to return to work as long as you are sitting for the majority of your shift. I am also ordering a walker for you to use and help with balance as you walk through the office.   Your blood pressure has been elevated. I am increasing your valsartan to 80 mg daily. Please follow up with [place] in 1 week for blood pressure recheck.   Please continue to be on the lookout for a call from the cardiology office regarding your appointment as discussed at your last visit.    Please take a 15 minute break every 2 hours at work.

## 2022-04-29 ENCOUNTER — Encounter: Payer: Self-pay | Admitting: Family Medicine

## 2022-04-29 ENCOUNTER — Ambulatory Visit (INDEPENDENT_AMBULATORY_CARE_PROVIDER_SITE_OTHER): Payer: Self-pay | Admitting: Family Medicine

## 2022-04-29 DIAGNOSIS — I10 Essential (primary) hypertension: Secondary | ICD-10-CM

## 2022-04-29 DIAGNOSIS — D509 Iron deficiency anemia, unspecified: Secondary | ICD-10-CM

## 2022-04-29 DIAGNOSIS — I639 Cerebral infarction, unspecified: Secondary | ICD-10-CM

## 2022-04-29 NOTE — Progress Notes (Signed)
    SUBJECTIVE:   CHIEF COMPLAINT / HPI: HTN f/u  LL is 56yo F w/ PMHx of HTN and ischemic stroke (6/23) that p/f HTN and hospital f/u. Pt is worried that diastolic BP was low in clinic today. Denies dizziness, orthostasis, or syncope. Does not want to start anticoagulation and concerned about safety using the drug, especially since she is uneasy on feet and has fallen recently. Pt prefers to see cardiologist at end up month and have meds changed at that time.   Pt was also noted to be anemic while hospitalized for stroke. She deferred blood draw today.  PERTINENT  PMH / PSH: T2DM  OBJECTIVE:   BP (!) 136/58   Pulse 65   Ht 5\' 2"  (1.575 m)   Wt 163 lb (73.9 kg)   SpO2 98%   BMI 29.81 kg/m   Gen: Well-appearing woman sitting in chair Pulm: Regular WOB, CTAB. No wheezing CV: RRR Neuro: Sensation grossly intact bilaterally in all extremities and face. Holds on to exam table while standing up, but normal gait.  ASSESSMENT/PLAN:   Essential hypertension BP 136/58 today, improving from prior. Pt concerned about low diastolic, but denies dizziness, syncope, or orthostasis. Pt deferred medication changes today and wants to see Cardiologist (has appt later this month) - Cont valsartan  Acute ischemic stroke [facility]) Hospitalized 6/23 for ischemic stroke. Pt deferred starting anticoag. On exam, gait is improving and sensation grossly intact. - Cont ASA - Cont atorvastatin 40mg  daily   Microcytic anemia Was found to have microcytic anemia at recent hospitalization. Pt also does not have hx of colonoscopy or FIT testing (performed but no results on file). Concern for potential GI bleed. Pt deferred iron studies today. - Consider iron study at next visit and colonoscopy referral    7/23, MD [facility]

## 2022-04-29 NOTE — Patient Instructions (Signed)
Good to see you today - Thank you for coming in!  Things we discussed today:  You have a cardiologist appointment on 7/27. They can help optimize your medicines for your blood pressure and recent stroke/TIA.  You also had low hemoglobin. We will check your blood iron levels at your next appointment.  Come back to see me in 2 months for followup.

## 2022-04-29 NOTE — Assessment & Plan Note (Addendum)
Hospitalized 6/23 for ischemic stroke. Pt deferred starting anticoag. On exam, gait is improving and sensation grossly intact. - Cont ASA - Cont atorvastatin 40mg  daily

## 2022-04-29 NOTE — Assessment & Plan Note (Signed)
BP 136/58 today, improving from prior. Pt concerned about low diastolic, but denies dizziness, syncope, or orthostasis. Pt deferred medication changes today and wants to see Cardiologist (has appt later this month) - Cont valsartan

## 2022-04-29 NOTE — Assessment & Plan Note (Signed)
Was found to have microcytic anemia at recent hospitalization. Pt also does not have hx of colonoscopy or FIT testing (performed but no results on file). Concern for potential GI bleed. Pt deferred iron studies today. - Consider iron study at next visit and colonoscopy referral

## 2022-05-01 ENCOUNTER — Ambulatory Visit: Payer: No Typology Code available for payment source | Admitting: Physical Therapy

## 2022-05-02 ENCOUNTER — Ambulatory Visit (HOSPITAL_COMMUNITY)
Admission: RE | Admit: 2022-05-02 | Discharge: 2022-05-02 | Disposition: A | Discharge status: Home / Self Care | Payer: Self-pay | Source: Ambulatory Visit | Attending: Internal Medicine | Admitting: Internal Medicine

## 2022-05-02 ENCOUNTER — Ambulatory Visit (INDEPENDENT_AMBULATORY_CARE_PROVIDER_SITE_OTHER): Payer: Self-pay

## 2022-05-02 ENCOUNTER — Other Ambulatory Visit: Payer: Self-pay

## 2022-05-02 ENCOUNTER — Encounter (HOSPITAL_COMMUNITY): Payer: Self-pay

## 2022-05-02 VITALS — BP 144/78 | HR 67 | Temp 98.4°F | Resp 18

## 2022-05-02 DIAGNOSIS — M25561 Pain in right knee: Secondary | ICD-10-CM

## 2022-05-02 DIAGNOSIS — S86811A Strain of other muscle(s) and tendon(s) at lower leg level, right leg, initial encounter: Secondary | ICD-10-CM

## 2022-05-02 DIAGNOSIS — W19XXXA Unspecified fall, initial encounter: Secondary | ICD-10-CM

## 2022-05-02 DIAGNOSIS — S80211A Abrasion, right knee, initial encounter: Secondary | ICD-10-CM

## 2022-05-02 DIAGNOSIS — S8001XA Contusion of right knee, initial encounter: Secondary | ICD-10-CM

## 2022-05-02 MED ORDER — DICLOFENAC SODIUM 75 MG PO TBEC: 75.0000 mg | DELAYED_RELEASE_TABLET | Freq: Two times a day (BID) | ORAL | 0 refills | Status: AC

## 2022-05-02 NOTE — ED Triage Notes (Signed)
Patient reports a fall 4 weeks ago.  Patient says she turned around too quickly and fell.  Patient reports pain in right knee.    Patient reports since fall has had multiple hospitalizations for abnormal potassium levels, stroke.

## 2022-05-02 NOTE — ED Provider Notes (Signed)
[facility]    CSN: [id number] Arrival date & time: 05/02/22  1728      History   Chief Complaint Chief Complaint  Patient presents with   Fall    I had a fall about 4 weeks ago on my right leg and it is still hurting. I am off balance and it is hard to walk. There might be some infection. I have no fever - Entered by patient   Appointment    6:00    HPI Grace Ferrell is a 56 y.o. female.   Pleasant 56 year old female presents today with concern of right knee pain.  She states roughly 1 month ago she was standing at the bus stop, but someone called out to her and offered her ride.  She states she jerked her knee and immediately started running to catch a ride, but ended up "falling hard" with a direct blow on her right knee on the concrete.  She states it caused a wound that has since then healed.  She had been using topical Neosporin.  She has not come in until today because she has been in and out of the hospital due to other health ailments.  She states since the fall, her knee has been unstable, and she has been "walking all wonky".  She has been taking over-the-counter Aleve, Tylenol, and Advil without resolution to her discomfort.  She reports the pain is primarily to the inferior patellar tendon.  She denies any bruising or swelling.   Fall   Past Medical History:  Diagnosis Date   Diabetes mellitus    Hypercholesteremia    Hypertension     Patient Active Problem List   Diagnosis Date Noted   Microcytic anemia 04/29/2022   Late effects of cerebral ischemic stroke 04/21/2022   Cerebellar stroke (HCC)    Hypomagnesemia    Acute ischemic stroke (HCC) 04/07/2022   Chronic systolic heart failure (HCC) 10/26/2015   Moderate mitral regurgitation by prior echocardiogram 10/21/2014   Moderate aortic regurgitation 10/21/2014   Dyspnea    Essential hypertension    Acute non-recurrent pansinusitis 11/24/2013   Mixed hyperlipidemia due to type 2 diabetes mellitus  (HCC) 03/12/2012   TOBACCO USER 07/29/2009   DM2 (diabetes mellitus, type 2) (HCC) 12/18/2006    Past Surgical History:  Procedure Laterality Date   ENDOMETRIAL ABLATION     KNEE SURGERY     TUBAL LIGATION      OB History     Gravida  3   Para  3   Term  3   Preterm      AB      Living  3      SAB      IAB      Ectopic      Multiple      Live Births  3            Home Medications    Prior to Admission medications   Medication Sig Start Date End Date Taking? Authorizing Provider  diclofenac (VOLTAREN) 75 MG EC tablet Take 1 tablet (75 mg total) by mouth 2 (two) times daily with a meal for 7 days. 05/02/22 05/09/22 Yes [place] Kreitzer L, PA  aspirin EC 325 MG tablet Take 1 tablet (325 mg total) by mouth daily. 04/09/22 04/09/23  Maury Dus, MD  atorvastatin (LIPITOR) 40 MG tablet Take 1 tablet (40 mg total) by mouth daily. 11/05/21   Maury Dus, MD  carvedilol (COREG) 6.25 MG  tablet Take 1 tablet (6.25 mg total) by mouth 2 (two) times daily with a meal. 11/05/21   Maury Dus, MD  cetirizine (ZYRTEC) 10 MG tablet Take 1 tablet (10 mg total) by mouth daily. 06/18/21   Maury Dus, MD  famotidine (PEPCID) 20 MG tablet Take 1 tablet (20 mg total) by mouth 2 (two) times daily. 02/12/22   Erick Alley, DO  fluticasone (FLONASE) 50 MCG/ACT nasal spray Place 2 sprays into both nostrils daily. Patient taking differently: Place 1 spray into both nostrils in the morning and at bedtime. 08/08/21   Maury Dus, MD  furosemide (LASIX) 40 MG tablet Take 1 tablet (40 mg total) by mouth daily. Patient not taking: Reported on 04/15/2022 11/05/21   Maury Dus, MD  glimepiride (AMARYL) 4 MG tablet TAKE TWO TABLETS BY MOUTH EVERY MORNING WITH BREAKFAST Patient taking differently: Take 8 mg by mouth daily with breakfast. 02/05/22   Maury Dus, MD  metFORMIN (GLUCOPHAGE) 1000 MG tablet TAKE ONE TABLET BY MOUTH TWICE A DAY WITH MEALS Patient taking differently:  Take 1,000 mg by mouth 2 (two) times daily with a meal. 02/05/22   Maury Dus, MD  Multiple Vitamins-Minerals (WOMENS MULTIVITAMIN) TABS Take 1 tablet by mouth daily.    [provider]  valsartan (DIOVAN) 80 MG tablet Take 1 tablet (80 mg total) by mouth daily. 04/22/22   Simmons-Robinson, Tawanna Cooler, MD    Family History Family History  Problem Relation Age of Onset   Stroke Mother    Hypertension Sister    Diabetes Sister    Breast cancer Sister    Hypertension Brother    Breast cancer Maternal Grandmother    Hypertension Brother    Hypertension Sister    Stroke Sister    Hypertension Sister     Social History Social History   Tobacco Use   Smoking status: Every Day    Packs/day: 0.50    Years: 40.00    Total pack years: 20.00    Types: Cigarettes   Smokeless tobacco: Never   Tobacco comments:    Smoked 2.5 ppd for multiple (~5) years in her 64s.  Smoked 0.5-0.75 ppd for majority of adult life.  Quit with pregnancy (3x) and 1 additional longer quit in 2012 associated with a hospitalization.  Parents did not smoke. Daily smoker 16yo  Sister smokes  Vaping Use   Vaping Use: Never used  Substance Use Topics   Alcohol use: No    Alcohol/week: 0.0 standard drinks of alcohol   Drug use: No     Allergies   Sulfamethoxazole-trimethoprim, Tessalon perles [benzonatate], Codeine, Darvocet [propoxyphene n-acetaminophen], Enalapril, Morphine and related, and Vicodin [hydrocodone-acetaminophen]   Review of Systems Review of Systems As per HPI  Physical Exam Triage Vital Signs ED Triage Vitals  Enc Vitals Group     BP 05/02/22 1812 (!) 144/78     Pulse Rate 05/02/22 1812 67     Resp 05/02/22 1812 18     Temp 05/02/22 1812 98.4 F (36.9 C)     Temp Source 05/02/22 1812 Oral     SpO2 05/02/22 1812 95 %     Weight --      Height --      Head Circumference --      Peak Flow --      Pain Score 05/02/22 1809 4     Pain Loc --      Pain Edu? --      Excl. in  GC? --  No data found.  Updated Vital Signs BP (!) 144/78 (BP Location: Left Arm)   Pulse 67   Temp 98.4 F (36.9 C) (Oral)   Resp 18   SpO2 95%   Visual Acuity Right Eye Distance:   Left Eye Distance:   Bilateral Distance:    Right Eye Near:   Left Eye Near:    Bilateral Near:     Physical Exam Vitals and nursing note reviewed.  Musculoskeletal:     Right upper leg: Normal. No swelling, edema, deformity, lacerations, tenderness or bony tenderness.     Left upper leg: Normal. No edema, deformity, lacerations, tenderness or bony tenderness.     Right knee: Bony tenderness (to superior tibial plateau) present. No swelling, deformity, effusion, erythema, ecchymosis, lacerations or crepitus. Normal range of motion. Tenderness (to inferior patellar tendon) present over the patellar tendon. No LCL laxity, MCL laxity, ACL laxity or PCL laxity. Normal alignment, normal meniscus and normal patellar mobility. Normal pulse.     Instability Tests: Anterior drawer test negative. Posterior drawer test negative. Anterior Lachman test negative.     Left knee: Normal. No swelling, deformity, effusion, erythema, ecchymosis, lacerations, bony tenderness or crepitus. Normal range of motion. No tenderness. No LCL laxity, MCL laxity, ACL laxity or PCL laxity.Normal alignment, normal meniscus and normal patellar mobility. Normal pulse.     Instability Tests: Anterior drawer test negative. Posterior drawer test negative. Anterior Lachman test negative.     Right lower leg: Bony tenderness (to tibial spine superiorly) present. No swelling, deformity or tenderness. No edema.     Left lower leg: Normal. No swelling, deformity, tenderness or bony tenderness. No edema.     Right ankle: Normal. No swelling, deformity, ecchymosis or lacerations. No tenderness. Normal range of motion. Normal pulse.     Right Achilles Tendon: Normal. No tenderness or defects. Thompson's test negative.     Left ankle: No swelling,  deformity, ecchymosis or lacerations. No tenderness. Normal range of motion. Normal pulse.     Left Achilles Tendon: Normal. No tenderness or defects. Thompson's test negative.     Right foot: Normal.     Left foot: Normal.     Comments: Healed abrasion with central scab to skin of patella. No active infection     UC Treatments / Results  Labs (all labs ordered are listed, but only abnormal results are displayed) Labs Reviewed - No data to display  EKG   Radiology DG Knee Complete 4 Views Right  Result Date: 05/02/2022 CLINICAL DATA:  Persistent right knee pain after fall 1 month ago. EXAM: RIGHT KNEE - COMPLETE 4+ VIEW COMPARISON:  None Available. FINDINGS: No evidence of fracture, dislocation, or significant joint effusion. No evidence of arthropathy or other focal bone abnormality. Soft tissues are unremarkable. Vascular calcifications. IMPRESSION: No acute osseous abnormality. Electronically Signed   By: Maudry Mayhew M.D.   On: 05/02/2022 18:39    Procedures Procedures (including critical care time)  Medications Ordered in UC Medications - No data to display  Initial Impression / Assessment and Plan / UC Course  I have reviewed the triage vital signs and the nursing notes.  Pertinent labs & imaging results that were available during my care of the patient were reviewed by me and considered in my medical decision making (see chart for details).     Contusion R knee -x-ray negative for fracture or acute bony trauma.  Pain is palpated only to the inferior patellar tendon and the superior tibial spine.  Patient is fully ambulatory.  We will recommend Rx NSAID twice daily, and knee compression sleeve. Patellar tendon strain -tenderness to palpation, but overall knee is very stable.  NSAIDs and knee compression device as discussed. Abrasion -this is 65 month old, and fully resolved.  No active sign of infection.   Final Clinical Impressions(s) / UC Diagnoses   Final diagnoses:   Contusion of right knee, initial encounter  Patellar tendon strain, right, initial encounter  Abrasion, knee, right, initial encounter     Discharge Instructions      Your knee x-ray is normal. You have a contusion of your knee with a patellar tendon strain. Please take the anti-inflammatory twice daily with food. Avoid taking any additional over-the-counter NSAIDs such as Aleve, Advil, Motrin, ibuprofen. Wear the knee brace daily. Follow-up with Ortho should your pain persist greater than additional 2 weeks.   ED Prescriptions     Medication Sig Dispense Auth. Provider   diclofenac (VOLTAREN) 75 MG EC tablet Take 1 tablet (75 mg total) by mouth 2 (two) times daily with a meal for 7 days. 14 tablet Ariabella Brien L, [place]      PDMP not reviewed this encounter.   Maretta Bees, [place] 05/02/22 2124

## 2022-05-02 NOTE — ED Notes (Signed)
Patient declined knee brace as she states she is un-insured. Patient states she will go straight to the pharmacy from the urgent care as they have over the counter knee braces that she can afford.   Informed Patient of the recommendations for a brace ad she states she will get one tonight when picking up her medication.

## 2022-05-02 NOTE — Discharge Instructions (Addendum)
Your knee x-ray is normal. You have a contusion of your knee with a patellar tendon strain. Please take the anti-inflammatory twice daily with food. Avoid taking any additional over-the-counter NSAIDs such as Aleve, Advil, Motrin, ibuprofen. Wear the knee brace daily. Follow-up with Ortho should your pain persist greater than additional 2 weeks.

## 2022-05-06 ENCOUNTER — Other Ambulatory Visit: Payer: Self-pay | Admitting: Family Medicine

## 2022-05-06 DIAGNOSIS — E119 Type 2 diabetes mellitus without complications: Secondary | ICD-10-CM

## 2022-05-08 ENCOUNTER — Ambulatory Visit: Payer: No Typology Code available for payment source | Attending: Family Medicine | Admitting: Physical Therapy

## 2022-05-09 ENCOUNTER — Other Ambulatory Visit: Payer: Self-pay | Admitting: Student

## 2022-05-15 ENCOUNTER — Ambulatory Visit: Payer: No Typology Code available for payment source | Admitting: Physical Therapy

## 2022-05-16 ENCOUNTER — Ambulatory Visit: Payer: No Typology Code available for payment source | Admitting: Cardiovascular Disease

## 2022-05-22 ENCOUNTER — Ambulatory Visit: Payer: MEDICAID | Admitting: Physical Therapy

## 2022-06-06 ENCOUNTER — Telehealth: Payer: Self-pay | Admitting: Pharmacist

## 2022-06-06 MED ORDER — NORTRIPTYLINE HCL 25 MG PO CAPS: 50.0000 mg | ORAL_CAPSULE | Freq: Every day | ORAL | 2 refills | Status: DC

## 2022-06-06 NOTE — Telephone Encounter (Signed)
Patient returns call to discuss tobacco cessation status.   She reports a tremendous amount of stress lately.  She reports three deaths in her extended family and friends.  She reports that her grieving has been going on for several weeks.  She also shared that her 69 month job will end on September 3rd.  She has concerns related to her income at that time.  She also expressed concern related to her upcoming cardiology appointment.  She is concerned about her heart and need for additional medication.   She reports smoking 8-10 cigarettes per day.   She has worsened insomnia with recent stressors.  States that Tylenol PM has not helped much.   Discussed options and short-term tobacco use goals.   Patient agreed to initiate Nortriptyline 25mg  X 1 week then increase to 50mg   Patient educated on purpose, proper use and potential adverse effects of sedation.    I stressed short-term smoking goal would be to avoid increasing smoking and if possible, cut down slightly.  I agreed to talk with her again in early September (shortly after her employment ends).   Following instruction patient verbalized understanding of treatment plan.

## 2022-06-06 NOTE — Telephone Encounter (Signed)
Noted and agree. 

## 2022-06-13 ENCOUNTER — Ambulatory Visit (INDEPENDENT_AMBULATORY_CARE_PROVIDER_SITE_OTHER): Payer: Self-pay | Admitting: Cardiovascular Disease

## 2022-06-13 ENCOUNTER — Encounter: Payer: Self-pay | Admitting: Cardiovascular Disease

## 2022-06-13 VITALS — BP 122/70 | HR 73 | Ht 62.0 in | Wt 157.4 lb

## 2022-06-13 DIAGNOSIS — I10 Essential (primary) hypertension: Secondary | ICD-10-CM

## 2022-06-13 DIAGNOSIS — I428 Other cardiomyopathies: Secondary | ICD-10-CM

## 2022-06-13 DIAGNOSIS — Z01812 Encounter for preprocedural laboratory examination: Secondary | ICD-10-CM

## 2022-06-13 DIAGNOSIS — I513 Intracardiac thrombosis, not elsewhere classified: Secondary | ICD-10-CM

## 2022-06-13 DIAGNOSIS — I5022 Chronic systolic (congestive) heart failure: Secondary | ICD-10-CM

## 2022-06-13 NOTE — Patient Instructions (Addendum)
Medication Instructions:  No changes *If you need a refill on your cardiac medications before your next appointment, please call your pharmacy*   Lab Work: Today: BMET If you have labs (blood work) drawn today and your tests are completely normal, you will receive your results only by: MyChart Message (if you have MyChart) OR A paper copy in the mail If you have any lab test that is abnormal or we need to change your treatment, we will call you to review the results.   Testing/Procedures: Cardiac CTA  - see instructions below   Follow-Up: At [facility], you and your health needs are our priority.  As part of our continuing mission to provide you with exceptional heart care, we have created designated Provider Care Teams.  These Care Teams include your primary Cardiologist (physician) and Advanced Practice Providers (APPs -  Physician Assistants and Nurse Practitioners) who all work together to provide you with the care you need, when you need it.   Your next appointment:   3-4 week(s)  With Dr. Shari Prows or APP.   Other Instructions   Your cardiac CT will be scheduled at [facility] [address] [place], [place] [address] [phone]  Please arrive at the [facility] Entrance (Entrance C2) of [facility] 30 minutes prior to test start time. You can use the FREE valet parking offered at entrance C (encouraged to control the heart rate for the test)  Proceed to the [facility] Radiology Department (first floor) to check-in and test prep.  All radiology patients and guests should use entrance C2 at [facility], accessed from [facility], even though the hospital's physical address listed is [address]    Please follow these instructions carefully (unless otherwise directed):  On the Night Before the Test: Be sure to Drink plenty of water. Do not consume any caffeinated/decaffeinated beverages or  chocolate 12 hours prior to your test. Do not take any antihistamines 12 hours prior to your test. (Cetirizine)  On the Day of the Test: Drink plenty of water until 1 hour prior to the test. Do not eat any food 4 hours prior to the test. You may take your regular medications prior to the test.  Take your usual dose of Coreg 6.25 mg two hours prior to test. HOLD Furosemide/Hydrochlorothiazide morning of the test. FEMALES- please wear underwire-free bra if available, avoid dresses & tight clothing       After the Test: Drink plenty of water. After receiving IV contrast, you may experience a mild flushed feeling. This is normal. On occasion, you may experience a mild rash up to 24 hours after the test. This is not dangerous. If this occurs, you can take Benadryl 25 mg and increase your fluid intake. If you experience trouble breathing, this can be serious. If it is severe call 911 IMMEDIATELY. If it is mild, please call our office. If you take any of these medications: Glipizide/Metformin, Avandament, Glucavance, please do not take day of test and for 48 hours after completing test unless otherwise instructed.  We will call to schedule your test 2-4 weeks out understanding that some insurance companies will need an authorization prior to the service being performed.   For non-scheduling related questions, please contact the cardiac imaging nurse navigator should you have any questions/concerns: Rockwell Alexandria, Cardiac Imaging Nurse Navigator Larey Brick, Cardiac Imaging Nurse Navigator  Heart and Vascular Services Direct Office Dial: [phone]   For scheduling  needs, including cancellations and rescheduling, please call [place], [phone].

## 2022-06-13 NOTE — Progress Notes (Signed)
Chief Complaint  Patient presents with   Follow-up    LV thrombus   History of Present Illness: 56 yo female with history of DM, HTN, hyperlipidemia, systolic CHF, cardiomyopathy, LV thrombus, CVA, medical non-compliance and tobacco abuse who is added onto my schedule today for cardiac follow up. She was seen in June 2023 by Dr. Johney Frame. She has a history of systolic heart failure initially diagnosed in 2015 with LVEF 35-40% that improved to 55% in 2016. This was thought to be due to hypertensive heart disease. Also with history of AI and MR. She was lost to follow-up and has a history of significant medication noncompliance. She was admitted to [facility] June 2023 with with dizziness and was found to have cerebellar infarcts on MRI. TTE obtained as part of the stroke work-up revealed EF 25% with global hypokinesis, moderate RV dysfunction, moderate pulmonary HTN, mild MR, moderate AI. She was seen by Dr. Johney Frame while hospitalized and was advised to start anti-coagulation and consider ischemic workup but she refused both.  She was discharged home on no anti-coagulation against the advice of the care team.   She is here today for follow up. The patient denies any chest pain, palpitations, lower extremity edema, orthopnea, PND, dizziness, near syncope or syncope. She continues to have dyspnea at times but this is improved. She is down 8 lbs per her home scale over the past month. She has been taking Lasix 40 mg daily.   Primary Care Physician: Alcus Dad, MD  [facility] Cardiology: Johney Frame  Past Medical History:  Diagnosis Date   Chronic systolic CHF (congestive heart failure), NYHA class 2 (HCC)    Diabetes mellitus    Hypercholesteremia    Hypertension    LV (left ventricular) mural thrombus    NICM (nonischemic cardiomyopathy) ([place])     Past Surgical History:  Procedure Laterality Date   ENDOMETRIAL ABLATION     KNEE SURGERY     TUBAL LIGATION      Current Outpatient Medications   Medication Sig Dispense Refill   aspirin EC 325 MG tablet Take 1 tablet (325 mg total) by mouth daily. 100 tablet 3   atorvastatin (LIPITOR) 40 MG tablet Take 1 tablet (40 mg total) by mouth daily. 30 tablet 10   carvedilol (COREG) 6.25 MG tablet Take 1 tablet (6.25 mg total) by mouth 2 (two) times daily with a meal. 180 tablet 2   cetirizine (ZYRTEC) 10 MG tablet Take 1 tablet (10 mg total) by mouth daily. 30 tablet 11   famotidine (PEPCID) 20 MG tablet TAKE ONE TABLET BY MOUTH TWICE A DAY 60 tablet 0   fluticasone (FLONASE) 50 MCG/ACT nasal spray Place 2 sprays into both nostrils daily. (Patient taking differently: Place 1 spray into both nostrils in the morning and at bedtime.) 16 g 6   furosemide (LASIX) 40 MG tablet Take 40 mg by mouth as needed for edema or fluid.     glimepiride (AMARYL) 4 MG tablet TAKE TWO TABLETS BY MOUTH EVERY MORNING WITH BREAKFAST 180 tablet 0   metFORMIN (GLUCOPHAGE) 1000 MG tablet TAKE ONE TABLET BY MOUTH TWICE A DAY WITH MEALS 180 tablet 0   Multiple Vitamins-Minerals (WOMENS MULTIVITAMIN) TABS Take 1 tablet by mouth daily.     valsartan (DIOVAN) 80 MG tablet Take 1 tablet (80 mg total) by mouth daily. 60 tablet 0   No current facility-administered medications for this visit.    Allergies  Allergen Reactions   Sulfamethoxazole-Trimethoprim Shortness Of Breath and Swelling  Airway involvement noted   Tessalon Perles [Benzonatate] Rash   Codeine Itching    With excoriations   Darvocet [Propoxyphene N-Acetaminophen] Nausea And Vomiting   Enalapril Cough    Dry cough   Morphine And Related Itching    With excoriations   Vicodin [Hydrocodone-Acetaminophen] Nausea Only and Cough    Social History   Socioeconomic History   Marital status: Single    Spouse name: Not on file   Number of children: 3   Years of education: Not on file   Highest education level: Some college, no degree  Occupational History   Not on file  Tobacco Use   Smoking status:  Every Day    Packs/day: 0.50    Years: 40.00    Total pack years: 20.00    Types: Cigarettes   Smokeless tobacco: Never   Tobacco comments:    Smoked 2.5 ppd for multiple (~5) years in her 52s.  Smoked 0.5-0.75 ppd for majority of adult life.  Quit with pregnancy (3x) and 1 additional longer quit in 2012 associated with a hospitalization.  Parents did not smoke. Daily smoker 16yo  Sister smokes  Vaping Use   Vaping Use: Never used  Substance and Sexual Activity   Alcohol use: No    Alcohol/week: 0.0 standard drinks of alcohol   Drug use: No   Sexual activity: Not Currently    Birth control/protection: Surgical  Other Topics Concern   Not on file  Social History Narrative   Not on file   Social Determinants of Health   Financial Resource Strain: Not on file  Food Insecurity: No Food Insecurity (08/16/2021)   Hunger Vital Sign    Worried About Running Out of Food in the Last Year: Never true    Ran Out of Food in the Last Year: Never true  Transportation Needs: No Transportation Needs (08/16/2021)   PRAPARE - Administrator, Civil Service (Medical): No    Lack of Transportation (Non-Medical): No  Physical Activity: Not on file  Stress: Not on file  Social Connections: Not on file  Intimate Partner Violence: Not on file    Family History  Problem Relation Age of Onset   Stroke Mother    Hypertension Sister    Diabetes Sister    Breast cancer Sister    Hypertension Brother    Breast cancer Maternal Grandmother    Hypertension Brother    Hypertension Sister    Stroke Sister    Hypertension Sister     Review of Systems:  As stated in the HPI and otherwise negative.   BP 122/70   Pulse 73   Ht 5\' 2"  (1.575 m)   Wt 157 lb 6.4 oz (71.4 kg)   SpO2 97%   BMI 28.79 kg/m   Physical Examination: General: Well developed, well nourished, NAD  HEENT: OP clear, mucus membranes moist  SKIN: warm, dry. No rashes. Neuro: No focal deficits  Musculoskeletal:  Muscle strength 5/5 all ext  Psychiatric: Mood and affect normal  Neck: No JVD, no carotid bruits, no thyromegaly, no lymphadenopathy.  Lungs:Clear bilaterally, no wheezes, rhonci, crackles Cardiovascular: Regular rate and rhythm. No murmurs, gallops or rubs. Abdomen:Soft. Bowel sounds present. Non-tender.  Extremities: No lower extremity edema. Pulses are 2 + in the bilateral DP/PT.  EKG:  EKG is ordered today. The ekg ordered today demonstrates NSR, Non-specific T wave abnormality  Echo 04/08/22:   1. Prominent apical LV thrombus. Left ventricular ejection fraction, by  estimation,  is 25%. The left ventricle has severely decreased function.  The left ventricle demonstrates global hypokinesis. Left ventricular  diastolic parameters are consistent with   Grade II diastolic dysfunction (pseudonormalization).   2. Right ventricular systolic function is moderately reduced. The right  ventricular size is normal. There is moderately elevated pulmonary artery  systolic pressure.   3. Left atrial size was moderately dilated.   4. Right atrial size was mildly dilated.   5. The mitral valve is normal in structure. Mild to moderate mitral valve  regurgitation. No evidence of mitral stenosis.   6. Tricuspid valve regurgitation is mild to moderate.   7. The aortic valve is tricuspid. There is mild calcification of the  aortic valve. Aortic valve regurgitation is moderate. No aortic stenosis  is present.   8. The inferior vena cava is dilated in size with <50% respiratory  variability, suggesting right atrial pressure of 15 mmHg.   Recent Labs: 01/01/2022: B Natriuretic Peptide 477.3 04/08/2022: ALT 9; Magnesium 1.7 04/09/2022: Hemoglobin 10.6; Platelets 206 04/15/2022: BUN 11; Creatinine, Ser 0.92; Potassium 4.7; Sodium 141   Lipid Panel    Component Value Date/Time   CHOL 117 04/08/2022 0616   CHOL 144 08/07/2021 1642   TRIG 99 04/08/2022 0616   HDL 28 (L) 04/08/2022 0616   HDL 34 (L)  08/07/2021 1642   CHOLHDL 4.2 04/08/2022 0616   VLDL 20 04/08/2022 0616   LDLCALC 69 04/08/2022 0616   LDLCALC 85 08/07/2021 1642   LDLDIRECT 174 (H) 03/10/2012 1648     Wt Readings from Last 3 Encounters:  06/13/22 157 lb 6.4 oz (71.4 kg)  04/29/22 163 lb (73.9 kg)  04/22/22 164 lb 6.4 oz (74.6 kg)      Assessment and Plan:   1. Cardiomyopathy: Her cardiomyopathy is presumed to be non-ischemic but she has no recent ischemic testing. She is willing to arrange a cardiac CTA to exclude CAD. Will continue Coreg and Diovan. She is not interested in considering any other medications such as Jardiance or Entresto. I would consider this discussion at her follow up.   2. LV thrombus: Evidence of prominent LV thrombus on echo in June 2023. CVA documented by brain MRI. She has refused anti-coagulation as she had a friend that died from bleeding while on a blood thinner. I have told her today that I think her risk of stroke with LV thrombus present far outweighs her risk of bleeding on anti-coagulation. She still refuses which I have agreed is her right to do as a consenting adult. Continue ASA.   3. Chronic systolic CHF: Weight is stable. She has been using Lasix 40 mg per day. She is instructed to use extra Lasix as needed for weight gain or LE edema.   4. HTN: BP is controlled. No changes  5. Aortic insufficiency: Moderate by echo in June 2023. Repeat echo in June 2024.   6. Mitral regurgitation: Mild to moderate by echo in June 2023. repeat echo in June 2024.   Labs/ tests ordered today include:   Orders Placed This Encounter  Procedures   CT CORONARY MORPH W/CTA COR W/SCORE W/CA W/CM &/OR WO/CM   Basic Metabolic Panel (BMET)   EKG 12-Lead   Disposition:   F/U with Dr. Shari Prows or office APP in 3-4 weeks.    Signed, Verne Carrow, MD 06/13/2022 4:54 PM    [facility] [address] [place], [place], [place]  [address] Phone: [phone]; Fax: [phone]

## 2022-06-14 ENCOUNTER — Other Ambulatory Visit: Payer: Self-pay | Admitting: *Deleted

## 2022-06-14 LAB — BASIC METABOLIC PANEL
BUN/Creatinine Ratio: 11 (ref 9–23)
BUN: 11 mg/dL (ref 6–24)
CO2: 24 mmol/L (ref 20–29)
Calcium: 9.5 mg/dL (ref 8.7–10.2)
Chloride: 105 mmol/L (ref 96–106)
Creatinine, Ser: 1 mg/dL (ref 0.57–1.00)
Glucose: 73 mg/dL (ref 70–99)
Potassium: 3.9 mmol/L (ref 3.5–5.2)
Sodium: 146 mmol/L — ABNORMAL HIGH (ref 134–144)
eGFR: 65 mL/min/{1.73_m2} (ref >59)

## 2022-06-15 MED ORDER — FAMOTIDINE 20 MG PO TABS
20.0000 mg | ORAL_TABLET | Freq: Two times a day (BID) | ORAL | 0 refills | Status: DC
Start: 2022-06-15 — End: 2022-07-24

## 2022-06-28 ENCOUNTER — Other Ambulatory Visit: Payer: Self-pay | Admitting: Family Medicine

## 2022-06-28 DIAGNOSIS — I10 Essential (primary) hypertension: Secondary | ICD-10-CM

## 2022-06-28 DIAGNOSIS — I5022 Chronic systolic (congestive) heart failure: Secondary | ICD-10-CM

## 2022-07-07 ENCOUNTER — Other Ambulatory Visit: Payer: Self-pay | Admitting: Family Medicine

## 2022-07-23 ENCOUNTER — Other Ambulatory Visit: Payer: Self-pay | Admitting: Family Medicine

## 2022-07-23 DIAGNOSIS — E119 Type 2 diabetes mellitus without complications: Secondary | ICD-10-CM

## 2022-07-25 DIAGNOSIS — M79644 Pain in right finger(s): Secondary | ICD-10-CM | POA: Diagnosis not present

## 2022-07-25 DIAGNOSIS — M79661 Pain in right lower leg: Secondary | ICD-10-CM | POA: Diagnosis not present

## 2022-07-26 ENCOUNTER — Telehealth: Payer: Self-pay | Admitting: Pharmacist

## 2022-07-26 NOTE — Telephone Encounter (Signed)
Late Entry   Patient called directly to share that she was doing well.  She was excited to report that she now is employed within [employer].   Since last contact patient reports 6-7 per day  Medications currently being used;  None    Continues to rates IMPORTANCE of quitting tobacco as high.   Continues to rate CONFIDENCE of quitting tobacco as high.   Most common triggers to use tobacco include; habit  She reports no smoking during work hours (10-12 hours straight)   Motivation to quit: health/breathing  Total time with patient call and documentation of interaction: 13 minutes. Follow-up phone call planned: 3-4 week follow-up planned.

## 2022-08-04 ENCOUNTER — Other Ambulatory Visit: Payer: Self-pay | Admitting: Family Medicine

## 2022-08-04 DIAGNOSIS — I5022 Chronic systolic (congestive) heart failure: Secondary | ICD-10-CM

## 2022-08-04 DIAGNOSIS — I10 Essential (primary) hypertension: Secondary | ICD-10-CM

## 2022-08-04 DIAGNOSIS — E119 Type 2 diabetes mellitus without complications: Secondary | ICD-10-CM

## 2022-08-06 ENCOUNTER — Other Ambulatory Visit: Payer: Self-pay | Admitting: Family Medicine

## 2022-08-09 ENCOUNTER — Telehealth: Payer: Self-pay | Admitting: Pharmacist

## 2022-08-09 DIAGNOSIS — F17200 Nicotine dependence, unspecified, uncomplicated: Secondary | ICD-10-CM

## 2022-08-09 NOTE — Assessment & Plan Note (Signed)
Patient contacted for follow/up of tobacco intake reduction / cessation attempt.   Since last contact patient reports she had quit for [place] but has relapsed.   Medications currently being used;  none!  Patient denies any significant side effects from tobacco cessation therapy.   Continues to rates IMPORTANCE of quitting tobacco as high.  Continues to rate CONFIDENCE of quitting tobacco as moderate due to life stress including her daughter who lives in [place] her best friend who has recently was diagnosed with cancer.   Most common triggers to use tobacco include; STRESS and Worry   Motivation to quit: BREATHING - plans to schedule test at the hospital which she has not yet done.

## 2022-08-09 NOTE — Telephone Encounter (Signed)
-----   Message from Leavy Cella, [place] sent at 07/26/2022 12:14 PM EDT ----- Regarding: reduction from 6-7 per day

## 2022-08-09 NOTE — Telephone Encounter (Signed)
Patient contacted for follow/up of tobacco intake reduction / cessation attempt.   Since last contact patient reports she had quit for [place] but has relapsed.   Medications currently being used;  none!  Patient denies any significant side effects from tobacco cessation therapy.   Continues to rates IMPORTANCE of quitting tobacco as high.  Continues to rate CONFIDENCE of quitting tobacco as moderate due to life stress including her daughter who lives in [place] her best friend who has recently was diagnosed with cancer.   Most common triggers to use tobacco include; STRESS and Worry   Motivation to quit: BREATHING - plans to schedule test at the hospital which she has not yet done.   Total time with patient call and documentation of interaction: 12 minutes. Follow-up phone call planned: 2-3 weeks - patient will call when she has an opportunity.

## 2022-08-12 NOTE — Telephone Encounter (Signed)
Noted and agree. 

## 2022-08-16 ENCOUNTER — Ambulatory Visit (HOSPITAL_COMMUNITY): Payer: No Typology Code available for payment source

## 2022-09-02 ENCOUNTER — Telehealth: Payer: Self-pay | Admitting: Pharmacist
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
## 2022-09-02 NOTE — Telephone Encounter (Signed)
-----   Message from Kathrin Ruddy, RPH-CPP sent at 08/09/2022 11:28 AM EDT ----- Regarding: Patient will call me with update on tobacco cessation.

## 2022-09-05 ENCOUNTER — Telehealth: Payer: Self-pay | Admitting: *Deleted

## 2022-09-05 ENCOUNTER — Telehealth: Payer: Self-pay

## 2022-09-05 ENCOUNTER — Other Ambulatory Visit: Payer: Self-pay | Admitting: *Deleted

## 2022-09-05 DIAGNOSIS — Z01812 Encounter for preprocedural laboratory examination: Secondary | ICD-10-CM

## 2022-09-05 NOTE — Telephone Encounter (Signed)
-----   Message from Dorette Grate, RN sent at 09/05/2022  2:21 PM EST ----- [employer] Team  This patient is Diabetic and will need a new BMET ordered and drawn prior to her CCTA next Tuesday.  Can you please arrange?  Thanks, Christeen Douglas

## 2022-09-05 NOTE — Telephone Encounter (Signed)
-----   Message from Merle C Prescott, RN sent at 09/05/2022  2:21 PM EST ----- [address] Team  This patient is Diabetic and will need a new BMET ordered and drawn prior to her CCTA next Tuesday.  Can you please arrange?  Thanks, Merle 

## 2022-09-05 NOTE — Telephone Encounter (Signed)
Pt aware of the need to have BMET done prior to Tuesday  CTA  Pt going to try and come tomorrow .Grace Ferrell

## 2022-09-05 NOTE — Telephone Encounter (Signed)
Left a message to call back.  Pt needs BMP prior to 09/10/22 Cardiac CT.

## 2022-09-05 NOTE — Telephone Encounter (Signed)
Called handled by another RN please see telephone encounter from today.

## 2022-09-06 ENCOUNTER — Ambulatory Visit: Payer: BC Managed Care – PPO | Attending: Cardiovascular Disease

## 2022-09-06 DIAGNOSIS — Z01812 Encounter for preprocedural laboratory examination: Secondary | ICD-10-CM

## 2022-09-07 LAB — BASIC METABOLIC PANEL
BUN/Creatinine Ratio: 14 (ref 9–23)
BUN: 15 mg/dL (ref 6–24)
CO2: 25 mmol/L (ref 20–29)
Calcium: 10 mg/dL (ref 8.7–10.2)
Chloride: 102 mmol/L (ref 96–106)
Creatinine, Ser: 1.07 mg/dL — ABNORMAL HIGH (ref 0.57–1.00)
Glucose: 129 mg/dL — ABNORMAL HIGH (ref 70–99)
Potassium: 3.9 mmol/L (ref 3.5–5.2)
Sodium: 143 mmol/L (ref 134–144)
eGFR: 60 mL/min/{1.73_m2} (ref >59)

## 2022-09-09 ENCOUNTER — Telehealth (HOSPITAL_COMMUNITY): Payer: Self-pay | Admitting: *Deleted

## 2022-09-09 ENCOUNTER — Other Ambulatory Visit: Payer: Self-pay | Admitting: Family Medicine

## 2022-09-09 NOTE — Telephone Encounter (Signed)
Reaching out to patient to offer assistance regarding upcoming cardiac imaging study; pt verbalizes understanding of appt date/time, parking situation and where to check in, medications ordered, and verified current allergies; name and call back number provided for further questions should they arise  Larey Brick RN Navigator Cardiac Imaging [facility] [phone] office [phone] cell  Patient to take her AM dose of carvedilol at 7am.  She is aware to arrive at 8:30am.

## 2022-09-10 ENCOUNTER — Ambulatory Visit (HOSPITAL_COMMUNITY)
Admission: RE | Admit: 2022-09-10 | Discharge: 2022-09-10 | Disposition: A | Discharge status: Home / Self Care | Payer: BC Managed Care – PPO | Source: Ambulatory Visit | Attending: Cardiovascular Disease | Admitting: Cardiovascular Disease

## 2022-09-10 ENCOUNTER — Encounter (HOSPITAL_COMMUNITY): Payer: Self-pay

## 2022-09-10 DIAGNOSIS — I10 Essential (primary) hypertension: Secondary | ICD-10-CM | POA: Diagnosis not present

## 2022-09-10 DIAGNOSIS — I428 Other cardiomyopathies: Secondary | ICD-10-CM | POA: Insufficient documentation

## 2022-09-10 DIAGNOSIS — I5022 Chronic systolic (congestive) heart failure: Secondary | ICD-10-CM | POA: Diagnosis not present

## 2022-09-10 DIAGNOSIS — I513 Intracardiac thrombosis, not elsewhere classified: Secondary | ICD-10-CM | POA: Diagnosis not present

## 2022-09-10 MED ORDER — NITROGLYCERIN 0.4 MG SL SUBL
0.8000 mg | SUBLINGUAL_TABLET | Freq: Once | SUBLINGUAL | Status: AC
  Administered 2022-09-10: 0.8 mg via SUBLINGUAL

## 2022-09-10 MED ORDER — NITROGLYCERIN 0.4 MG SL SUBL
SUBLINGUAL_TABLET | SUBLINGUAL | Status: AC
  Filled 2022-09-10: qty 2

## 2022-09-10 MED ORDER — IOHEXOL 350 MG/ML SOLN
100.0000 mL | Freq: Once | INTRAVENOUS | Status: AC | PRN
  Administered 2022-09-10: 100 mL via INTRAVENOUS

## 2022-09-16 ENCOUNTER — Other Ambulatory Visit: Payer: Self-pay

## 2022-09-16 ENCOUNTER — Telehealth: Payer: BC Managed Care – PPO | Admitting: Physician Assistant

## 2022-09-16 DIAGNOSIS — I10 Essential (primary) hypertension: Secondary | ICD-10-CM

## 2022-09-16 DIAGNOSIS — I5022 Chronic systolic (congestive) heart failure: Secondary | ICD-10-CM

## 2022-09-16 DIAGNOSIS — J208 Acute bronchitis due to other specified organisms: Secondary | ICD-10-CM | POA: Diagnosis not present

## 2022-09-16 DIAGNOSIS — J3489 Other specified disorders of nose and nasal sinuses: Secondary | ICD-10-CM | POA: Diagnosis not present

## 2022-09-16 MED ORDER — PREDNISONE 10 MG (21) PO TBPK: ORAL_TABLET | ORAL | 0 refills | Status: AC

## 2022-09-16 MED ORDER — GUAIFENESIN ER 600 MG PO TB12: 600.0000 mg | ORAL_TABLET | Freq: Two times a day (BID) | ORAL | 0 refills | Status: AC

## 2022-09-16 MED ORDER — PROMETHAZINE-DM 6.25-15 MG/5ML PO SYRP: 5.0000 mL | ORAL_SOLUTION | Freq: Four times a day (QID) | ORAL | 0 refills | Status: AC | PRN

## 2022-09-16 MED ORDER — IPRATROPIUM BROMIDE 0.03 % NA SOLN: 2.0000 | Freq: Two times a day (BID) | NASAL | 0 refills | Status: AC

## 2022-09-16 MED ORDER — ALBUTEROL SULFATE HFA 108 (90 BASE) MCG/ACT IN AERS: 1.0000 | INHALATION_SPRAY | Freq: Four times a day (QID) | RESPIRATORY_TRACT | 0 refills | Status: AC | PRN

## 2022-09-16 NOTE — Patient Instructions (Signed)
Grace Ferrell, thank you for joining Margaretann Loveless, PA-C for today's virtual visit.  While this provider is not your primary care provider (PCP), if your PCP is located in our provider database this encounter information will be shared with them immediately following your visit.   A [facility] MyChart account gives you access to today's visit and all your visits, tests, and labs performed at [facility] " click here if you don't have a [facility] MyChart account or go to [email or web address]  Consent: (Patient) Grace Ferrell provided verbal consent for this virtual visit at the beginning of the encounter.  Current Medications:  Current Outpatient Medications:    albuterol (VENTOLIN HFA) 108 (90 Base) MCG/ACT inhaler, Inhale 1-2 puffs into the lungs every 6 (six) hours as needed., Disp: 8 g, Rfl: 0   guaiFENesin (MUCINEX) 600 MG 12 hr tablet, Take 1 tablet (600 mg total) by mouth 2 (two) times daily., Disp: 30 tablet, Rfl: 0   ipratropium (ATROVENT) 0.03 % nasal spray, Place 2 sprays into both nostrils every 12 (twelve) hours., Disp: 30 mL, Rfl: 0   predniSONE (STERAPRED UNI-PAK 21 TAB) 10 MG (21) TBPK tablet, 6 day taper; take as directed on package instructions, Disp: 21 tablet, Rfl: 0   promethazine-dextromethorphan (PROMETHAZINE-DM) 6.25-15 MG/5ML syrup, Take 5 mLs by mouth 4 (four) times daily as needed., Disp: 118 mL, Rfl: 0   aspirin EC 325 MG tablet, Take 1 tablet (325 mg total) by mouth daily., Disp: 100 tablet, Rfl: 3   atorvastatin (LIPITOR) 40 MG tablet, TAKE ONE TABLET BY MOUTH DAILY, Disp: 90 tablet, Rfl: 0   carvedilol (COREG) 6.25 MG tablet, TAKE ONE TABLET BY MOUTH TWICE A DAY WITH A MEAL, Disp: 180 tablet, Rfl: 2   cetirizine (ZYRTEC) 10 MG tablet, TAKE ONE TABLET BY MOUTH DAILY, Disp: 90 tablet, Rfl: 3   famotidine (PEPCID) 20 MG tablet, TAKE 1 TABLET BY MOUTH TWICE A DAY, Disp: 60 tablet, Rfl: 0   fluticasone (FLONASE) 50 MCG/ACT nasal spray, Place 2  sprays into both nostrils daily. (Patient taking differently: Place 1 spray into both nostrils in the morning and at bedtime.), Disp: 16 g, Rfl: 6   furosemide (LASIX) 40 MG tablet, Take 40 mg by mouth as needed for edema or fluid., Disp: , Rfl:    glimepiride (AMARYL) 4 MG tablet, TAKE 2 TABLETS BY MOUTH EVERY MORNING WITH BREAKFAST, Disp: 180 tablet, Rfl: 0   metFORMIN (GLUCOPHAGE) 1000 MG tablet, TAKE 1 TABLET BY MOUTH TWICE A DAY WITH A MEAL, Disp: 180 tablet, Rfl: 0   Multiple Vitamins-Minerals (WOMENS MULTIVITAMIN) TABS, Take 1 tablet by mouth daily., Disp: , Rfl:    valsartan (DIOVAN) 80 MG tablet, TAKE 1 TABLET BY MOUTH DAILY, Disp: 60 tablet, Rfl: 0   Medications ordered in this encounter:  Meds ordered this encounter  Medications   promethazine-dextromethorphan (PROMETHAZINE-DM) 6.25-15 MG/5ML syrup    Sig: Take 5 mLs by mouth 4 (four) times daily as needed.    Dispense:  118 mL    Refill:  0    Order Specific Question:   Supervising Provider    Answer:   Merrilee Jansky [[id number]]   predniSONE (STERAPRED UNI-PAK 21 TAB) 10 MG (21) TBPK tablet    Sig: 6 day taper; take as directed on package instructions    Dispense:  21 tablet    Refill:  0    Order Specific Question:   Supervising Provider    Answer:   Leonides Grills,  PHILIP O [[id number]]   albuterol (VENTOLIN HFA) 108 (90 Base) MCG/ACT inhaler    Sig: Inhale 1-2 puffs into the lungs every 6 (six) hours as needed.    Dispense:  8 g    Refill:  0    Order Specific Question:   Supervising Provider    Answer:   Merrilee Jansky [[id number]]   guaiFENesin (MUCINEX) 600 MG 12 hr tablet    Sig: Take 1 tablet (600 mg total) by mouth 2 (two) times daily.    Dispense:  30 tablet    Refill:  0    Order Specific Question:   Supervising Provider    Answer:   Merrilee Jansky [[id number]]   ipratropium (ATROVENT) 0.03 % nasal spray    Sig: Place 2 sprays into both nostrils every 12 (twelve) hours.    Dispense:  30 mL    Refill:  0    Order  Specific Question:   Supervising Provider    Answer:   Merrilee Jansky [[id number]]     *If you need refills on other medications prior to your next appointment, please contact your pharmacy*  Follow-Up: Call back or seek an in-person evaluation if the symptoms worsen or if the condition fails to improve as anticipated.  [facility] [phone]  Other Instructions  Acute Bronchitis, Adult  Acute bronchitis is sudden inflammation of the main airways (bronchi) that come off the windpipe (trachea) in the lungs. The swelling causes the airways to get smaller and make more mucus than normal. This can make it hard to breathe and can cause coughing or noisy breathing (wheezing). Acute bronchitis may last several weeks. The cough may last longer. Allergies, asthma, and exposure to smoke may make the condition worse. What are the causes? This condition can be caused by germs and by substances that irritate the lungs, including: Cold and flu viruses. The most common cause of this condition is the virus that causes the common cold. Bacteria. This is less common. Breathing in substances that irritate the lungs, including: Smoke from cigarettes and other forms of tobacco. Dust and pollen. Fumes from household cleaning products, gases, or burned fuel. Indoor or outdoor air pollution. What increases the risk? The following factors may make you more likely to develop this condition: A weak body's defense system, also called the immune system. A condition that affects your lungs and breathing, such as asthma. What are the signs or symptoms? Common symptoms of this condition include: Coughing. This may bring up clear, yellow, or green mucus from your lungs (sputum). Wheezing. Runny or stuffy nose. Having too much mucus in your lungs (chest congestion). Shortness of breath. Aches and pains, including sore throat or chest. How is this diagnosed? This condition is usually diagnosed  based on: Your symptoms and medical history. A physical exam. You may also have other tests, including tests to rule out other conditions, such as pneumonia. These tests include: A test of lung function. Test of a mucus sample to look for the presence of bacteria. Tests to check the oxygen level in your blood. Blood tests. Chest X-ray. How is this treated? Most cases of acute bronchitis clear up over time without treatment. Your health care provider may recommend: Drinking more fluids to help thin your mucus so it is easier to cough up. Taking inhaled medicine (inhaler) to improve air flow in and out of your lungs. Using a vaporizer or a humidifier. These are machines that add water to the  air to help you breathe better. Taking a medicine that thins mucus and clears congestion (expectorant). Taking a medicine that prevents or stops coughing (cough suppressant). It is not common to take an antibiotic medicine for this condition. Follow these instructions at home:  Take over-the-counter and prescription medicines only as told by your health care provider. Use an inhaler, vaporizer, or humidifier as told by your health care provider. Take two teaspoons (10 mL) of honey at bedtime to lessen coughing at night. Drink enough fluid to keep your urine pale yellow. Do not use any products that contain nicotine or tobacco. These products include cigarettes, chewing tobacco, and vaping devices, such as e-cigarettes. If you need help quitting, ask your health care provider. Get plenty of rest. Return to your normal activities as told by your health care provider. Ask your health care provider what activities are safe for you. Keep all follow-up visits. This is important. How is this prevented? To lower your risk of getting this condition again: Wash your hands often with soap and water for at least 20 seconds. If soap and water are not available, use hand sanitizer. Avoid contact with people who  have cold symptoms. Try not to touch your mouth, nose, or eyes with your hands. Avoid breathing in smoke or chemical fumes. Breathing smoke or chemical fumes will make your condition worse. Get the flu shot every year. Contact a health care provider if: Your symptoms do not improve after 2 weeks. You have trouble coughing up the mucus. Your cough keeps you awake at night. You have a fever. Get help right away if you: Cough up blood. Feel pain in your chest. Have severe shortness of breath. Faint or keep feeling like you are going to faint. Have a severe headache. Have a fever or chills that get worse. These symptoms may represent a serious problem that is an emergency. Do not wait to see if the symptoms will go away. Get medical help right away. Call your local emergency services (911 in the [place] Do not drive yourself to the hospital. Summary Acute bronchitis is inflammation of the main airways (bronchi) that come off the windpipe (trachea) in the lungs. The swelling causes the airways to get smaller and make more mucus than normal. Drinking more fluids can help thin your mucus so it is easier to cough up. Take over-the-counter and prescription medicines only as told by your health care provider. Do not use any products that contain nicotine or tobacco. These products include cigarettes, chewing tobacco, and vaping devices, such as e-cigarettes. If you need help quitting, ask your health care provider. Contact a health care provider if your symptoms do not improve after 2 weeks. This information is not intended to replace advice given to you by your health care provider. Make sure you discuss any questions you have with your health care provider. Document Revised: 01/17/2022 Document Reviewed: 02/07/2021 Elsevier Patient Education  [place].    If you have been instructed to have an in-person evaluation today at a local Urgent Care facility, please use the link below. It will  take you to a list of all of our available [facility], including address, phone number and hours of operation. Please do not delay care.  [facility]  If you or a family member do not have a primary care provider, use the link below to schedule a visit and establish care. When you choose a [facility] primary care physician or advanced  practice provider, you gain a long-term partner in health. Find a Primary Care Provider  Learn more about [facility]'s in-office and virtual care options: [facility]

## 2022-09-16 NOTE — Progress Notes (Signed)
Virtual Visit Consent   AVERILL Ferrell, you are scheduled for a virtual visit with a [facility] provider today. Just as with appointments in the office, your consent must be obtained to participate. Your consent will be active for this visit and any virtual visit you may have with one of our providers in the next 365 days. If you have a MyChart account, a copy of this consent can be sent to you electronically.  As this is a virtual visit, video technology does not allow for your provider to perform a traditional examination. This may limit your provider's ability to fully assess your condition. If your provider identifies any concerns that need to be evaluated in person or the need to arrange testing (such as labs, EKG, etc.), we will make arrangements to do so. Although advances in technology are sophisticated, we cannot ensure that it will always work on either your end or our end. If the connection with a video visit is poor, the visit may have to be switched to a telephone visit. With either a video or telephone visit, we are not always able to ensure that we have a secure connection.  By engaging in this virtual visit, you consent to the provision of healthcare and authorize for your insurance to be billed (if applicable) for the services provided during this visit. Depending on your insurance coverage, you may receive a charge related to this service.  I need to obtain your verbal consent now. Are you willing to proceed with your visit today? Grace Ferrell has provided verbal consent on 09/16/2022 for a virtual visit (video or telephone). Grace Loveless, PA-C  Date: 09/16/2022 8:52 AM  Virtual Visit via Video Note   I, Grace Ferrell, connected with  Grace Ferrell  ([id number], 10-05-1966) on 09/16/22 at  8:30 AM EST by a video-enabled telemedicine application and verified that I am speaking with the correct person using two identifiers.  Location: Patient: Virtual Visit Location Patient:  Home Provider: Virtual Visit Location Provider: Home Office   I discussed the limitations of evaluation and management by telemedicine and the availability of in person appointments. The patient expressed understanding and agreed to proceed.    History of Present Illness: Grace Ferrell is a 56 y.o. who identifies as a female who was assigned female at birth, and is being seen today for Cough.  HPI: Cough This is a new problem. The current episode started in the past 7 days (Last weds). The problem has been gradually worsening. The problem occurs every few minutes. The cough is Non-productive. Associated symptoms include chills, ear congestion, ear pain (right side), a fever (subjective fevers), headaches, myalgias, nasal congestion, postnasal drip, rhinorrhea and wheezing. Pertinent negatives include no sore throat, shortness of breath or sweats. Nothing aggravates the symptoms. Treatments tried: Severe sinus combination medication. The treatment provided no relief. Her past medical history is significant for bronchitis and pneumonia. There is no history of asthma.    Had Atelectasis noted bilateral lung bases and early small airway disease noted on a Chest CT done Tuesday 09/10/22 (results in epic)  Problems:  Patient Active Problem List   Diagnosis Date Noted   Microcytic anemia 04/29/2022   Late effects of cerebral ischemic stroke 04/21/2022   Cerebellar stroke (HCC)    Hypomagnesemia    Acute ischemic stroke (HCC) 04/07/2022   Chronic systolic heart failure (HCC) 10/26/2015   Moderate mitral regurgitation by prior echocardiogram 10/21/2014   Moderate aortic regurgitation 10/21/2014  Dyspnea    Essential hypertension    Acute non-recurrent pansinusitis 11/24/2013   Mixed hyperlipidemia due to type 2 diabetes mellitus (HCC) 03/12/2012   TOBACCO USER 07/29/2009   DM2 (diabetes mellitus, type 2) (HCC) 12/18/2006    Allergies:  Allergies  Allergen Reactions    Sulfamethoxazole-Trimethoprim Shortness Of Breath and Swelling    Airway involvement noted   Tessalon Perles [Benzonatate] Rash   Codeine Itching    With excoriations   Darvocet [Propoxyphene N-Acetaminophen] Nausea And Vomiting   Enalapril Cough    Dry cough   Morphine And Related Itching    With excoriations   Vicodin [Hydrocodone-Acetaminophen] Nausea Only and Cough   Medications:  Current Outpatient Medications:    albuterol (VENTOLIN HFA) 108 (90 Base) MCG/ACT inhaler, Inhale 1-2 puffs into the lungs every 6 (six) hours as needed., Disp: 8 g, Rfl: 0   guaiFENesin (MUCINEX) 600 MG 12 hr tablet, Take 1 tablet (600 mg total) by mouth 2 (two) times daily., Disp: 30 tablet, Rfl: 0   ipratropium (ATROVENT) 0.03 % nasal spray, Place 2 sprays into both nostrils every 12 (twelve) hours., Disp: 30 mL, Rfl: 0   predniSONE (STERAPRED UNI-PAK 21 TAB) 10 MG (21) TBPK tablet, 6 day taper; take as directed on package instructions, Disp: 21 tablet, Rfl: 0   promethazine-dextromethorphan (PROMETHAZINE-DM) 6.25-15 MG/5ML syrup, Take 5 mLs by mouth 4 (four) times daily as needed., Disp: 118 mL, Rfl: 0   aspirin EC 325 MG tablet, Take 1 tablet (325 mg total) by mouth daily., Disp: 100 tablet, Rfl: 3   atorvastatin (LIPITOR) 40 MG tablet, TAKE ONE TABLET BY MOUTH DAILY, Disp: 90 tablet, Rfl: 0   carvedilol (COREG) 6.25 MG tablet, TAKE ONE TABLET BY MOUTH TWICE A DAY WITH A MEAL, Disp: 180 tablet, Rfl: 2   cetirizine (ZYRTEC) 10 MG tablet, TAKE ONE TABLET BY MOUTH DAILY, Disp: 90 tablet, Rfl: 3   famotidine (PEPCID) 20 MG tablet, TAKE 1 TABLET BY MOUTH TWICE A DAY, Disp: 60 tablet, Rfl: 0   fluticasone (FLONASE) 50 MCG/ACT nasal spray, Place 2 sprays into both nostrils daily. (Patient taking differently: Place 1 spray into both nostrils in the morning and at bedtime.), Disp: 16 g, Rfl: 6   furosemide (LASIX) 40 MG tablet, Take 40 mg by mouth as needed for edema or fluid., Disp: , Rfl:    glimepiride (AMARYL)  4 MG tablet, TAKE 2 TABLETS BY MOUTH EVERY MORNING WITH BREAKFAST, Disp: 180 tablet, Rfl: 0   metFORMIN (GLUCOPHAGE) 1000 MG tablet, TAKE 1 TABLET BY MOUTH TWICE A DAY WITH A MEAL, Disp: 180 tablet, Rfl: 0   Multiple Vitamins-Minerals (WOMENS MULTIVITAMIN) TABS, Take 1 tablet by mouth daily., Disp: , Rfl:    valsartan (DIOVAN) 80 MG tablet, TAKE 1 TABLET BY MOUTH DAILY, Disp: 60 tablet, Rfl: 0  Observations/Objective: Patient is well-developed, well-nourished in no acute distress.  Resting comfortably at home.  Head is normocephalic, atraumatic.  No labored breathing.  Speech is clear and coherent with logical content.  Patient is alert and oriented at baseline.    Assessment and Plan: 1. Acute viral bronchitis - promethazine-dextromethorphan (PROMETHAZINE-DM) 6.25-15 MG/5ML syrup; Take 5 mLs by mouth 4 (four) times daily as needed.  Dispense: 118 mL; Refill: 0 - predniSONE (STERAPRED UNI-PAK 21 TAB) 10 MG (21) TBPK tablet; 6 day taper; take as directed on package instructions  Dispense: 21 tablet; Refill: 0 - albuterol (VENTOLIN HFA) 108 (90 Base) MCG/ACT inhaler; Inhale 1-2 puffs into the lungs every  6 (six) hours as needed.  Dispense: 8 g; Refill: 0 - guaiFENesin (MUCINEX) 600 MG 12 hr tablet; Take 1 tablet (600 mg total) by mouth 2 (two) times daily.  Dispense: 30 tablet; Refill: 0  2. Rhinorrhea - ipratropium (ATROVENT) 0.03 % nasal spray; Place 2 sprays into both nostrils every 12 (twelve) hours.  Dispense: 30 mL; Refill: 0  - Worsening over a week despite OTC medications - Will treat with Prednisone and Albuterol - Promethazine DM for cough - Mucinex for congestion - Ipratropium for runny nose - Can continue flonase - Push fluids.  - Rest.  - Steam and humidifier can help - Seek in person evaluation if worsening or symptoms fail to improve    Follow Up Instructions: I discussed the assessment and treatment plan with the patient. The patient was provided an opportunity to  ask questions and all were answered. The patient agreed with the plan and demonstrated an understanding of the instructions.  A copy of instructions were sent to the patient via MyChart unless otherwise noted below.    The patient was advised to call back or seek an in-person evaluation if the symptoms worsen or if the condition fails to improve as anticipated.  Time:  I spent 18 minutes with the patient via telehealth technology discussing the above problems/concerns.    Grace Loveless, PA-C

## 2022-09-16 NOTE — Telephone Encounter (Signed)
Patient calls nurse line regarding refill for valsartan. She reports that she has been out for 5 days and that she requested refill from pharmacy. Advised patient that we did not receive this refill request.   Will forward to PCP.   Veronda Prude, RN

## 2022-09-17 ENCOUNTER — Other Ambulatory Visit: Payer: Self-pay | Admitting: Family Medicine

## 2022-09-17 DIAGNOSIS — I10 Essential (primary) hypertension: Secondary | ICD-10-CM

## 2022-09-17 DIAGNOSIS — I5022 Chronic systolic (congestive) heart failure: Secondary | ICD-10-CM

## 2022-09-18 MED ORDER — VALSARTAN 80 MG PO TABS: 80.0000 mg | ORAL_TABLET | Freq: Every day | ORAL | 1 refills | Status: AC

## 2022-09-23 ENCOUNTER — Ambulatory Visit (INDEPENDENT_AMBULATORY_CARE_PROVIDER_SITE_OTHER): Payer: BC Managed Care – PPO | Admitting: Student

## 2022-09-23 ENCOUNTER — Encounter: Payer: Self-pay | Admitting: Student

## 2022-09-23 VITALS — BP 132/78 | HR 87 | Ht 62.0 in | Wt 157.0 lb

## 2022-09-23 DIAGNOSIS — B9689 Other specified bacterial agents as the cause of diseases classified elsewhere: Secondary | ICD-10-CM | POA: Insufficient documentation

## 2022-09-23 DIAGNOSIS — J329 Chronic sinusitis, unspecified: Secondary | ICD-10-CM | POA: Diagnosis not present

## 2022-09-23 MED ORDER — AMOXICILLIN-POT CLAVULANATE 875-125 MG PO TABS: 1.0000 | ORAL_TABLET | Freq: Two times a day (BID) | ORAL | 0 refills | Status: AC

## 2022-09-23 NOTE — Progress Notes (Signed)
    SUBJECTIVE:   CHIEF COMPLAINT / HPI:   Grace Ferrell is a 56 year old female here for follow-up of bronchitis.  Her symptoms started on Thanksgiving day when she was coughing a lot.  She initially had diarrhea and a "low-grade fever" at home to 100.0 Fahrenheit.  She was seen via virtual visit on 09/16/2022 and given a prednisone Dosepak as well as promethazine-dextromethorphan and ipratropium nasal spray as well as albuterol.  She says her symptoms are just continuing and she is having purulent nasal discharge as well as facial pain, coughing and feels that she cannot breathe well and cannot walk but a short distance.  She feels that the nasal spray is helping and she had "a bunch of green gunk" come out of her nose after using it, but otherwise she feels miserable.  She says she feels cold and cannot get warm.  Smokes 1 cigarette a day. Was smoking 0.5 PPD.  PERTINENT  PMH / PSH: Tobacco use  OBJECTIVE:   BP 132/78   Pulse 87   Ht 5\' 2"  (1.575 m)   Wt 157 lb (71.2 kg)   SpO2 98%   BMI 28.72 kg/m   General: Ill-appearing but nontoxic appearing female with congestion CV: Regular rate and rhythm Respiratory: Normal work of breathing on room air.  Fine crackles in left lung field with scant wheezing.  Right lung clear throughout.   ASSESSMENT/PLAN:   Bacterial sinusitis Given ongoing symptoms for 10 days now without improvement, will prescribe Augmentin 875 twice daily for 7 days. Encouraged patient to continue using nasal spray as well as ipratropium and nasal spray. Return precautions discussed.   Also have concern for multifocal pneumonia versus COPD exacerbation given fine crackles and dyspnea the patient is having.  She declined chest x-ray. No red flag signs or symptoms that indicates she needs more urgent evaluation.  She is generally well-appearing, afebrile, normal work of breathing on room air without hypoxia. Will treat with antibiotic as above. Return precautions  again discussed should she worsen or not improve.  , DO [facility]

## 2022-09-23 NOTE — Patient Instructions (Signed)
So great seeing you today.  I am sorry you aren't feeling well.  Take Augmentin twice daily for 7 days. If not feeling better by Friday, please call me.  Keep using the nasal spray (Ipatropium).  Best wishes, Dr. Melissa Noon

## 2022-09-23 NOTE — Assessment & Plan Note (Signed)
Given ongoing symptoms for 10 days now without improvement, will prescribe Augmentin 875 twice daily for 7 days. Encouraged patient to continue using nasal spray as well as ipratropium and nasal spray. Return precautions discussed.

## 2022-09-24 ENCOUNTER — Ambulatory Visit: Payer: BC Managed Care – PPO

## 2022-09-25 ENCOUNTER — Telehealth: Payer: Self-pay | Admitting: Pharmacist

## 2022-09-25 NOTE — Telephone Encounter (Signed)
Noted and agree. 

## 2022-09-25 NOTE — Telephone Encounter (Signed)
Patient contacted for follow/up of tobacco intake reduction / cessation attempt.   Since last contact patient reports she has been diagnosed with pneumonia. She reports she is breathing very short of breath with her illness which forced her to avoid smoking completely.   Medications currently being used;  None.  Continues to rates IMPORTANCE of staying from tobacco/smoking as high.   Most common triggers to use tobacco include; STRESS   Motivation to quit: health and breathing  Total time with patient call and documentation of interaction: 12 minutes. Follow-up phone call planned: early  2024 patient will plan to call and share an update.   I thanked her for the update.

## 2022-10-16 ENCOUNTER — Other Ambulatory Visit: Payer: Self-pay

## 2022-10-16 ENCOUNTER — Emergency Department (HOSPITAL_COMMUNITY)
Admission: EM | Admit: 2022-10-16 | Discharge: 2022-10-16 | Disposition: A | Discharge status: Home / Self Care | Payer: BC Managed Care – PPO | Attending: Emergency Medicine | Admitting: Emergency Medicine

## 2022-10-16 ENCOUNTER — Encounter (HOSPITAL_COMMUNITY): Payer: Self-pay

## 2022-10-16 DIAGNOSIS — Z4800 Encounter for change or removal of nonsurgical wound dressing: Secondary | ICD-10-CM | POA: Insufficient documentation

## 2022-10-16 DIAGNOSIS — Z5189 Encounter for other specified aftercare: Secondary | ICD-10-CM

## 2022-10-16 DIAGNOSIS — Z4801 Encounter for change or removal of surgical wound dressing: Secondary | ICD-10-CM | POA: Diagnosis not present

## 2022-10-16 DIAGNOSIS — Z7982 Long term (current) use of aspirin: Secondary | ICD-10-CM | POA: Diagnosis not present

## 2022-10-16 LAB — MRSA NEXT GEN BY PCR, NASAL: MRSA by PCR Next Gen: NOT DETECTED

## 2022-10-16 NOTE — ED Triage Notes (Signed)
Pt has a circular wound to both shins from a fall 5- 6 months ago. Pt states that she thinks that she may have MRSA because it hasn't healed.

## 2022-10-16 NOTE — ED Provider Triage Note (Signed)
Emergency Medicine Provider Triage Evaluation Note  Grace Ferrell , a 56 y.o. female  was evaluated in triage.  Pt complains of lesion to left shin.  This has been present for 5 to 6 months.  She is coming in today to be evaluated for MRSA.Marland Kitchen  Review of Systems  Positive: As above Negative: As above  Physical Exam  BP 113/80 (BP Location: Left Arm)   Pulse 68   Temp 99.3 F (37.4 C) (Oral)   Resp 18   SpO2 100%  Gen:   Awake, no distress   Resp:  Normal effort  MSK:   Moves extremities without difficulty  Other:    Medical Decision Making  Medically screening exam initiated at 7:35 PM.  Appropriate orders placed.  WRENLEY SAYED was informed that the remainder of the evaluation will be completed by another provider, this initial triage assessment does not replace that evaluation, and the importance of remaining in the ED until their evaluation is complete.    Marita [place], PA-C 10/16/22 1936

## 2022-10-16 NOTE — ED Notes (Signed)
I provided reinforced discharge education based off of after visit summary/care provided. Pt acknowledged and understood my education. Pt had no further questions/concerns for provider/myself. After visit summary provided to pt. 

## 2022-10-16 NOTE — ED Provider Notes (Signed)
[facility]-EMERGENCY DEPT Provider Note   CSN: [id number] Arrival date & time: 10/16/22  1720     History  Chief Complaint  Patient presents with   Wound Check    Grace Ferrell is a 56 y.o. female.  56 yo F with a chief complaints chronic wounds to her bilateral lower extremities.  The right proximal tibial area she fell and skinned it since the left mid shin she says that she injured it in a different way.  Both been there for about 6 months or so.  She has been applying bacitracin to the area.  Has been on a couple different courses of antibiotics for different things and is concerned because these have not gotten better.  She was concerned that maybe she had MRSA and so came here to be tested.   Wound Check       Home Medications Prior to Admission medications   Medication Sig Start Date End Date Taking? Authorizing Provider  albuterol (VENTOLIN HFA) 108 (90 Base) MCG/ACT inhaler Inhale 1-2 puffs into the lungs every 6 (six) hours as needed. 09/16/22   Margaretann Loveless, PA-C  aspirin EC 325 MG tablet Take 1 tablet (325 mg total) by mouth daily. 04/09/22 04/09/23  Maury Dus, MD  atorvastatin (LIPITOR) 40 MG tablet TAKE ONE TABLET BY MOUTH DAILY 08/06/22   Idalia Needle, Turkey J, DO  carvedilol (COREG) 6.25 MG tablet TAKE ONE TABLET BY MOUTH TWICE A DAY WITH A MEAL 08/05/22   Paige, Turkey J, DO  cetirizine (ZYRTEC) 10 MG tablet TAKE ONE TABLET BY MOUTH DAILY 07/08/22   Maury Dus, MD  famotidine (PEPCID) 20 MG tablet TAKE 1 TABLET BY MOUTH TWICE A DAY 09/09/22   Maury Dus, MD  fluticasone (FLONASE) 50 MCG/ACT nasal spray Place 2 sprays into both nostrils daily. Patient taking differently: Place 1 spray into both nostrils in the morning and at bedtime. 08/08/21   Maury Dus, MD  furosemide (LASIX) 40 MG tablet Take 40 mg by mouth as needed for edema or fluid.    [provider]  glimepiride (AMARYL) 4 MG tablet TAKE 2 TABLETS BY  MOUTH EVERY MORNING WITH BREAKFAST 08/05/22   Idalia Needle, Lucas Mallow, DO  guaiFENesin (MUCINEX) 600 MG 12 hr tablet Take 1 tablet (600 mg total) by mouth 2 (two) times daily. 09/16/22   Margaretann Loveless, PA-C  ipratropium (ATROVENT) 0.03 % nasal spray Place 2 sprays into both nostrils every 12 (twelve) hours. 09/16/22   Margaretann Loveless, PA-C  metFORMIN (GLUCOPHAGE) 1000 MG tablet TAKE 1 TABLET BY MOUTH TWICE A DAY WITH A MEAL 07/24/22   Maury Dus, MD  Multiple Vitamins-Minerals (WOMENS MULTIVITAMIN) TABS Take 1 tablet by mouth daily.    [provider]  predniSONE (STERAPRED UNI-PAK 21 TAB) 10 MG (21) TBPK tablet 6 day taper; take as directed on package instructions 09/16/22   Margaretann Loveless, PA-C  promethazine-dextromethorphan (PROMETHAZINE-DM) 6.25-15 MG/5ML syrup Take 5 mLs by mouth 4 (four) times daily as needed. 09/16/22   Margaretann Loveless, PA-C  valsartan (DIOVAN) 80 MG tablet Take 1 tablet (80 mg total) by mouth daily. 09/18/22   Maury Dus, MD      Allergies    Sulfamethoxazole-trimethoprim, Tessalon perles [benzonatate], Codeine, Darvocet [propoxyphene n-acetaminophen], Enalapril, Morphine and related, and Vicodin [hydrocodone-acetaminophen]    Review of Systems   Review of Systems  Physical Exam Updated Vital Signs BP 113/80 (BP Location: Left Arm)   Pulse 68   Temp 99.3  F (37.4 C) (Oral)   Resp 18   Ht 5\' 2"  (1.575 m)   Wt 71.2 kg   SpO2 100%   BMI 28.71 kg/m  Physical Exam Vitals and nursing note reviewed.  Constitutional:      General: She is not in acute distress.    Appearance: She is well-developed. She is not diaphoretic.  HENT:     Head: Normocephalic and atraumatic.  Eyes:     Pupils: Pupils are equal, round, and reactive to light.  Cardiovascular:     Rate and Rhythm: Normal rate and regular rhythm.     Heart sounds: No murmur heard.    No friction rub. No gallop.  Pulmonary:     Effort: Pulmonary effort is normal.      Breath sounds: No wheezing or rales.  Abdominal:     General: There is no distension.     Palpations: Abdomen is soft.     Tenderness: There is no abdominal tenderness.  Musculoskeletal:        General: No tenderness.     Cervical back: Normal range of motion and neck supple.  Skin:    General: Skin is warm and dry.     Comments: Small ulceration to the right proximal tibial area with some granulation tissue.  No obvious erythema fluctuance or induration.  Small ulceration to the left mid shin.  No obvious surrounding erythema fluctuance or induration.  Intact pulse motor and sensation.  Cap refill less than 2 seconds.  Neurological:     Mental Status: She is alert and oriented to person, place, and time.  Psychiatric:        Behavior: Behavior normal.     ED Results / Procedures / Treatments   Labs (all labs ordered are listed, but only abnormal results are displayed) Labs Reviewed  MRSA NEXT GEN BY PCR, NASAL    EKG None  Radiology No results found.  Procedures Procedures    Medications Ordered in ED Medications - No data to display  ED Course/ Medical Decision Making/ A&P                           Medical Decision Making  56 yo F with a chief complaints of wounds to bilateral lower extremities.  She tells me these have been here for about 5 to 6 months.  Hard to tell the reason for them not healing clinically.  Could be an intolerance to the bacitracin that she has been applying that religiously to the area.  I discussed with her about not using alcohol or hydroperoxide.  Will have her follow-up with her family doctor in the office.  11:05 PM:  I have discussed the diagnosis/risks/treatment options with the patient.  Evaluation and diagnostic testing in the emergency department does not suggest an emergent condition requiring admission or immediate intervention beyond what has been performed at this time.  They will follow up with PCP. We also discussed returning to  the ED immediately if new or worsening sx occur. We discussed the sx which are most concerning (e.g., sudden worsening pain, fever, inability to tolerate by mouth) that necessitate immediate return. Medications administered to the patient during their visit and any new prescriptions provided to the patient are listed below.  Medications given during this visit Medications - No data to display   The patient appears reasonably screen and/or stabilized for discharge and I doubt any other medical condition or other [facility]  requiring further screening, evaluation, or treatment in the ED at this time prior to discharge.          Final Clinical Impression(s) / ED Diagnoses Final diagnoses:  Encounter for wound re-check    Rx / DC Orders ED Discharge Orders     None         Melene Plan, DO 10/16/22 2305

## 2022-10-16 NOTE — Discharge Instructions (Signed)
Please stop applying bacitracin to the area.  You apply Vaseline if you like a couple times a day.  Soap and water.  Please follow-up with your family doctor.  If these do not continue to heal then they may want to have a vascular study performed or heavy seen at the wound clinic.

## 2022-10-23 ENCOUNTER — Other Ambulatory Visit: Payer: Self-pay | Admitting: Family Medicine

## 2022-10-31 ENCOUNTER — Telehealth: Payer: Self-pay | Admitting: Family Medicine

## 2022-10-31 NOTE — Telephone Encounter (Signed)
Received from funeral, they are sending the death certificate to Dr. Andria Frames to be signed

## 2022-10-31 NOTE — Telephone Encounter (Signed)
Grace Ferrell, What do you know about the cause of death.  There is nothing in the chart.  To fill out a death certificate I need to know, Where and when did the patient die.  Who pronounced her?  The death certificate asks for times - but there is a checkbox for the time being actual or approximate. Was the cause of death natural?  Accidental? Any reason to refer to medical examiner? What was the cause of death (diagnosis) and the diagnoses that may have contributed. Did she use tobacco and did that contribute. Was she pregnant? Doubt this is an issue at age 57  For my patients in situations like this, I generally call the family and lead by offering my condolences.  Typically, the family will then tell you the story.  If they don't volunteer the story, I simply ask "What happened?"  I may need to ask a couple of questions to get all the information.  I generally tell them what I will write on the death certificate so that no one is surprised.    Do you feel comfortable calling the family? [place]

## 2022-11-01 NOTE — Telephone Encounter (Signed)
I called Grace Ferrell and obtained the following information. Patient lived alone. Family last spoke to patient Friday evening Jan 5.  They became concerned because they couldn't reach her.  Shadricka's aunt went to [place] home on the morning of 2023-02-16 2022/11/17 and found her dead in bed.  She looked peaceful.  Patient did have a history of CHF and nonischemic cardiomyopathy, diabetes, hypertension, old CVA and tobacco abuse.  No struggle, evidence of break in.  Clearly the death seemed natural.  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  While the date of death is uncertain and with the agreement of Benjamine Mola, I listed date of death as 11-17-2022.   Of course, I offered condolences for the whole [facility].

## 2022-11-01 NOTE — Telephone Encounter (Signed)
Attempted to contact patient's daughter, Berlie Persky, to discuss circumstances related to patient's death. No answer. Left HIPAA-compliant voicemail for her to call our office.  Will also try again to reach her in 15-30 minutes.   Alcus Dad, MD PGY-3, [place]

## 2022-11-06 ENCOUNTER — Other Ambulatory Visit: Payer: Self-pay | Admitting: Family Medicine

## 2022-11-06 DIAGNOSIS — E119 Type 2 diabetes mellitus without complications: Secondary | ICD-10-CM

## 2022-11-21 DEATH — deceased
